# Patient Record
Sex: Male | Born: 1945 | Race: White | Hispanic: No | Marital: Married | Smoking: Former smoker
Health system: Southern US, Community
[De-identification: ages and names within clinical notes are randomized; demographics above are authoritative.]

## PROBLEM LIST (undated history)

## (undated) DIAGNOSIS — H269 Unspecified cataract: Secondary | ICD-10-CM

## (undated) DIAGNOSIS — N183 Chronic kidney disease, stage 3 unspecified: Secondary | ICD-10-CM

## (undated) DIAGNOSIS — Z87442 Personal history of urinary calculi: Secondary | ICD-10-CM

## (undated) DIAGNOSIS — I10 Essential (primary) hypertension: Secondary | ICD-10-CM

## (undated) DIAGNOSIS — I499 Cardiac arrhythmia, unspecified: Secondary | ICD-10-CM

## (undated) DIAGNOSIS — E119 Type 2 diabetes mellitus without complications: Secondary | ICD-10-CM

## (undated) DIAGNOSIS — J189 Pneumonia, unspecified organism: Secondary | ICD-10-CM

## (undated) DIAGNOSIS — N189 Chronic kidney disease, unspecified: Secondary | ICD-10-CM

## (undated) DIAGNOSIS — K219 Gastro-esophageal reflux disease without esophagitis: Secondary | ICD-10-CM

## (undated) DIAGNOSIS — T8859XA Other complications of anesthesia, initial encounter: Secondary | ICD-10-CM

## (undated) DIAGNOSIS — T4145XA Adverse effect of unspecified anesthetic, initial encounter: Secondary | ICD-10-CM

## (undated) DIAGNOSIS — N390 Urinary tract infection, site not specified: Secondary | ICD-10-CM

## (undated) DIAGNOSIS — M1990 Unspecified osteoarthritis, unspecified site: Secondary | ICD-10-CM

## (undated) DIAGNOSIS — R600 Localized edema: Secondary | ICD-10-CM

## (undated) DIAGNOSIS — M25569 Pain in unspecified knee: Secondary | ICD-10-CM

## (undated) DIAGNOSIS — Z6841 Body Mass Index (BMI) 40.0 and over, adult: Secondary | ICD-10-CM

## (undated) DIAGNOSIS — E785 Hyperlipidemia, unspecified: Secondary | ICD-10-CM

## (undated) HISTORY — PX: HAND SURGERY: SHX662

## (undated) HISTORY — DX: Chronic kidney disease, unspecified: N18.9

## (undated) HISTORY — PX: POLYPECTOMY: SHX149

## (undated) HISTORY — PX: COLONOSCOPY: SHX174

## (undated) HISTORY — DX: Localized edema: R60.0

## (undated) HISTORY — DX: Unspecified cataract: H26.9

## (undated) HISTORY — DX: Body Mass Index (BMI) 40.0 and over, adult: Z684

## (undated) HISTORY — DX: Morbid (severe) obesity due to excess calories: E66.01

## (undated) HISTORY — DX: Hyperlipidemia, unspecified: E78.5

---

## 2000-04-08 ENCOUNTER — Ambulatory Visit (HOSPITAL_BASED_OUTPATIENT_CLINIC_OR_DEPARTMENT_OTHER): Admission: RE | Admit: 2000-04-08 | Discharge: 2000-04-08 | Payer: Self-pay | Admitting: Orthopedic Surgery

## 2001-02-03 ENCOUNTER — Ambulatory Visit (HOSPITAL_BASED_OUTPATIENT_CLINIC_OR_DEPARTMENT_OTHER): Admission: RE | Admit: 2001-02-03 | Discharge: 2001-02-03 | Payer: Self-pay | Admitting: Orthopedic Surgery

## 2001-02-03 ENCOUNTER — Encounter (INDEPENDENT_AMBULATORY_CARE_PROVIDER_SITE_OTHER): Payer: Self-pay | Admitting: *Deleted

## 2003-02-01 ENCOUNTER — Encounter: Admission: RE | Admit: 2003-02-01 | Discharge: 2003-05-02 | Payer: Self-pay | Admitting: Family Medicine

## 2003-02-10 ENCOUNTER — Ambulatory Visit (HOSPITAL_BASED_OUTPATIENT_CLINIC_OR_DEPARTMENT_OTHER): Admission: RE | Admit: 2003-02-10 | Discharge: 2003-02-10 | Payer: Self-pay | Admitting: Family Medicine

## 2003-05-31 ENCOUNTER — Encounter: Admission: RE | Admit: 2003-05-31 | Discharge: 2003-08-29 | Payer: Self-pay | Admitting: Family Medicine

## 2003-06-24 ENCOUNTER — Emergency Department (HOSPITAL_COMMUNITY): Admission: EM | Admit: 2003-06-24 | Discharge: 2003-06-24 | Payer: Self-pay | Admitting: Emergency Medicine

## 2005-07-04 ENCOUNTER — Emergency Department (HOSPITAL_COMMUNITY): Admission: EM | Admit: 2005-07-04 | Discharge: 2005-07-05 | Payer: Self-pay | Admitting: Emergency Medicine

## 2005-10-07 ENCOUNTER — Ambulatory Visit: Payer: Self-pay | Admitting: Gastroenterology

## 2005-11-26 ENCOUNTER — Ambulatory Visit: Payer: Self-pay | Admitting: Gastroenterology

## 2005-12-04 ENCOUNTER — Ambulatory Visit (HOSPITAL_COMMUNITY): Admission: RE | Admit: 2005-12-04 | Discharge: 2005-12-04 | Payer: Self-pay | Admitting: Gastroenterology

## 2005-12-04 ENCOUNTER — Encounter (INDEPENDENT_AMBULATORY_CARE_PROVIDER_SITE_OTHER): Payer: Self-pay | Admitting: *Deleted

## 2005-12-06 ENCOUNTER — Ambulatory Visit: Payer: Self-pay | Admitting: Gastroenterology

## 2008-10-25 ENCOUNTER — Telehealth (INDEPENDENT_AMBULATORY_CARE_PROVIDER_SITE_OTHER): Payer: Self-pay | Admitting: *Deleted

## 2008-11-15 ENCOUNTER — Ambulatory Visit: Payer: Self-pay | Admitting: Gastroenterology

## 2008-12-08 ENCOUNTER — Encounter: Payer: Self-pay | Admitting: Gastroenterology

## 2008-12-08 ENCOUNTER — Ambulatory Visit (HOSPITAL_COMMUNITY): Admission: RE | Admit: 2008-12-08 | Discharge: 2008-12-08 | Payer: Self-pay | Admitting: Gastroenterology

## 2008-12-08 ENCOUNTER — Ambulatory Visit: Payer: Self-pay | Admitting: Gastroenterology

## 2008-12-09 ENCOUNTER — Encounter: Payer: Self-pay | Admitting: Gastroenterology

## 2010-11-21 LAB — CBC
HCT: 41.7 % (ref 39.0–52.0)
Hemoglobin: 13.9 g/dL (ref 13.0–17.0)
MCHC: 33.3 g/dL (ref 30.0–36.0)
MCV: 90 fL (ref 78.0–100.0)
Platelets: 213 10*3/uL (ref 150–400)
RBC: 4.63 MIL/uL (ref 4.22–5.81)
RDW: 13.4 % (ref 11.5–15.5)
WBC: 6.4 10*3/uL (ref 4.0–10.5)

## 2010-11-21 LAB — BASIC METABOLIC PANEL
BUN: 8 mg/dL (ref 6–23)
CO2: 31 mEq/L (ref 19–32)
Calcium: 9.5 mg/dL (ref 8.4–10.5)
Chloride: 100 mEq/L (ref 96–112)
Creatinine, Ser: 0.76 mg/dL (ref 0.4–1.5)
GFR calc Af Amer: >60 mL/min (ref >60)
GFR calc non Af Amer: >60 mL/min (ref >60)
Glucose, Bld: 123 mg/dL — ABNORMAL HIGH (ref 70–99)
Potassium: 4.5 mEq/L (ref 3.5–5.1)
Sodium: 139 mEq/L (ref 135–145)

## 2010-11-21 LAB — GLUCOSE, CAPILLARY: Glucose-Capillary: 112 mg/dL — ABNORMAL HIGH (ref 70–99)

## 2010-12-11 HISTORY — PX: OTHER SURGICAL HISTORY: SHX169

## 2010-12-11 HISTORY — PX: CARDIAC CATHETERIZATION: SHX172

## 2010-12-24 ENCOUNTER — Other Ambulatory Visit: Payer: Self-pay | Admitting: Cardiology

## 2010-12-24 ENCOUNTER — Ambulatory Visit
Admission: RE | Admit: 2010-12-24 | Discharge: 2010-12-24 | Disposition: A | Discharge status: Home / Self Care | Payer: Medicare Other | Source: Ambulatory Visit | Attending: Cardiology | Admitting: Cardiology

## 2010-12-28 ENCOUNTER — Ambulatory Visit (HOSPITAL_COMMUNITY)
Admission: RE | Admit: 2010-12-28 | Discharge: 2010-12-28 | Disposition: A | Discharge status: Home / Self Care | Payer: Medicare Other | Source: Ambulatory Visit | Attending: Cardiology | Admitting: Cardiology

## 2010-12-28 DIAGNOSIS — I10 Essential (primary) hypertension: Secondary | ICD-10-CM | POA: Insufficient documentation

## 2010-12-28 DIAGNOSIS — M1990 Unspecified osteoarthritis, unspecified site: Secondary | ICD-10-CM | POA: Insufficient documentation

## 2010-12-28 DIAGNOSIS — E119 Type 2 diabetes mellitus without complications: Secondary | ICD-10-CM | POA: Insufficient documentation

## 2010-12-28 DIAGNOSIS — E785 Hyperlipidemia, unspecified: Secondary | ICD-10-CM | POA: Insufficient documentation

## 2010-12-28 DIAGNOSIS — R9439 Abnormal result of other cardiovascular function study: Secondary | ICD-10-CM | POA: Insufficient documentation

## 2010-12-28 DIAGNOSIS — K219 Gastro-esophageal reflux disease without esophagitis: Secondary | ICD-10-CM | POA: Insufficient documentation

## 2010-12-28 DIAGNOSIS — I2510 Atherosclerotic heart disease of native coronary artery without angina pectoris: Secondary | ICD-10-CM | POA: Insufficient documentation

## 2010-12-28 DIAGNOSIS — R0789 Other chest pain: Secondary | ICD-10-CM | POA: Insufficient documentation

## 2010-12-28 LAB — GLUCOSE, CAPILLARY
Glucose-Capillary: 99 mg/dL (ref 70–99)
Glucose-Capillary: 103 mg/dL — ABNORMAL HIGH (ref 70–99)

## 2010-12-28 NOTE — Op Note (Signed)
[place]  Patient:    Alex Bowman, Alex Bowman                        MRN: [id number] Proc. Date: 02/03/01 Adm. Date:  [id number] Attending:  Ronne Binning CC:         Nicki Reaper, M.D. (2)   Operative Report  PREOPERATIVE DIAGNOSIS:  Dupuytrens contracture, left ring and little fingers.  POSTOPERATIVE DIAGNOSIS:  Dupuytrens contracture, left ring and little fingers.  OPERATION:  Excision of palmar fascia with V-Y advancement flaps, ring and little fingers, left hand.  SURGEON:  Nicki Reaper, M.D.  ASSISTANT:  Joaquin Courts, R.N.  ANESTHESIA:  Axillary block.  ANESTHESIOLOGIST:  Janetta Hora. Gelene Mink, M.D.  INDICATIONS:  The patient is a 65 year old male with a history of Dupuytrens contracture bilaterally.  He has undergone release of his right side.  He is admitted now for a release of his left ring and little fingers.  DESCRIPTION OF PROCEDURE:  The patient was brought to the operating room where an axillary block was carried out without difficulty.  He is prepped and draped using Betadine scrub and solution with the left arm free.  A volar zigzag Brunner incision is made primarily on the little finger and carried down through the subcutaneous tissue.  The palmar fascia was dissected off of the flexor retinaculum.  The neurovascular structures were identified proximally and followed distally.  With blunt and sharp dissection the entire cord was removed from the little finger onto the level of the middle phalanx. The abductor digiti quinti cord was also excised.  The neurovascular structures were protected throughout the procedure.  The finger became immediately straight.  A separate zigzag incision was made on the volar aspect of the ring finger, and carried down through the subcutaneous tissues.  Again the neurovascular structures are identified.  The cord to the ring finger proceeded to the proximal phalanx.  This was excised in toto.   This finger immediately became straight.  The neurovascular structures were then protected.  The wounds were irrigated and closed over a drain, converting the V-flaps to Ys, and then advancing the tips of the V into the limb of the Y. This allowed a complete closure with the fingers fully straight.  A sterile compressive dressing and splint were applied.  On deflation of the tourniquet the fingers pinked.  He was taken to the recovery room for observation in satisfactory condition.  DISPOSITION:  He is discharged home, to return to the [facility] in one week, on Vicodin and Keflex. DD:  02/03/01 TD:  02/03/01 Job: [id number]

## 2010-12-28 NOTE — Op Note (Signed)
[place]  Patient:    Alex Bowman, Alex Bowman                        MRN: [id number] Proc. Date: 04/08/00 Adm. Date:  [id number] Attending:  Ronne Binning                           Operative Report  PREOPERATIVE DIAGNOSIS:  Dupuytrens contracture, right little finger.  POSTOPERATIVE DIAGNOSIS:  Dupuytrens contracture, right little finger.  OPERATION:  Excision of Dupuytrens contracture, palmar fascia of right little finger.  SURGEON:  Nicki Reaper, M.D.  ASSISTANT:  RN.  ANESTHESIA:  Axillary block.  ANESTHESIOLOGIST:  Maren Beach, M.D.  INDICATIONS:  The patient is a 65 year old male with a history of Dupuytrens contracture with flexion of the metacarpophalangeal joint and PIP joint.  His metacarpophalangeal joint contracture is approximately 80 degrees.  DESCRIPTION OF PROCEDURE:  The patient was brought to the operating room where a forearm based tourniquet was placed and an axillary block was given.  He was prepped and draped using Betadine scrubbing solution with the right arm free. The limb was exsanguinated with an Esmarch bandage and the tourniquet placed on the forearm was inflated to 250 mmHg due to his size.  This was done after exsanguination with an Esmarch bandage.  The incisions were made _____ volar Brunner incision and carried down through subcutaneous tissue.  This was done over the carpal retinaculum.  This relieved the contracture with the finger coming almost straight.  The dissection was carried out to the level of the PIP joint.  The cord was identified.  This was released proximally.  The entire cord was excised with protection to the neurovascular structures throughout the procedure.  The entire cord was excised along with the cord present from the abductor digiti quinti.  The finger was able to be fully straightened out without difficulty.  The wounds were irrigated.  A vessel loop drain was placed in the  depths of the wound.  These were converted _______, allowing advancement of the ________, allowing complete closure without any undue skin tension.  This was performed with interrupted 5-0 nylon sutures.  A sterile compressive dressing applied and the tourniquet deflated.  The finger immediately pink.  He was taken to the recovery room after placement of a splint in satisfactory condition.  He is discharged home to return to the [facility] in one week on Vicodin and Keflex. DD:  04/08/00 TD:  04/09/00 Job: [id number]

## 2011-01-05 NOTE — Cardiovascular Report (Signed)
Alex Bowman, PODESTA NO.:  [id number]  MEDICAL RECORD NO.:  [id number]           PATIENT TYPE:  O  LOCATION:  6522                         FACILITY:  [facility]  PHYSICIAN:  Landry Corporal, MD DATE OF BIRTH:  1946-07-24  DATE OF PROCEDURE:  12/28/2010 DATE OF DISCHARGE:  12/28/2010                           CARDIAC CATHETERIZATION   PRIMARY CARDIOLOGIST:  Landry Corporal, MD  PERFORMING PHYSICIAN:  Landry Corporal, MD  PROCEDURE PERFORMED: 1. Left heart catheterization via 5-French right radial access. 2. Left ventriculogram in the RAO projection with 11 mL contrast per     second over 3 seconds for a total of 33 mL. 3. Native coronary angiography.  INDICATIONS:  Multiple cardiac risk factors with high-risk nuclear perfusion scan for atypical chest pain.  BRIEF HISTORY:  I saw Alex Bowman at the [facility] after he had a scheduled Persantine Myoview done for what sounds more like GERD pain.  However, he is morbidly obese.  He has diabetes, hypertension, hyperlipidemia, and his nuclear stress test was found to have TID of 1.25 with inferolateral ischemia.  He is now referred for diagnostic cardiac catheterization.  The risks, benefits, alternatives, and indications of procedure were explained to the patient in great detail during his clinic visit.  The patient was stable on the morning of the procedure.  He agreed to proceed with the procedure with all questions answered.  Informed consent was obtained with a signed form placed on the chart.  PROCEDURE:  The patient was brought to the second floor of [facility] cardiac catheterization lab in a fasting state.  The right wrist was evaluated with a Barbeau test.  This demonstrated excellent collateral flow in the left ulnar artery, so therefore he was prepped and draped in the usual sterile fashion with a right radial access.  A time-out period was performed and the  patient was sedated with intravenous Versed and fentanyl, a total of 2 mg of Versed and 50 mcg of fentanyl.  The right wrist was anesthetized using 1% subcutaneous lidocaine, and the right radial artery was accessed using the Seldinger technique with placement of a 5-French sheath.  The sheath was aspirated and flushed, and infiltrated with 10 mL radial cocktail described below.  Also 7000 units of intravenous heparin was administered intravenously.  Then, a 5-French TIG 4-0 catheter was advanced over safety J-wire into the ascending aorta and then used to engage first the right and left coronary arteries.  Multiple angiographic views of both the coronary artery systems were obtained.  The catheter was then exchanged over wire, which was used to advance the left ventricle for a 5-French angled pigtail catheter, which was then advanced across over the wire to left ventricle.  Left ventricular hemodynamics were measured.  LV gram in the RAO projection was performed then after doing so, hemodynamics were then remeasured and the catheter pulled back across the aortic valve for measuring pullback gradient.  After this was completed, the catheter removed completely out of body over wire.  The sheath was then removed in the cath lab with placement  of a TR band at 40 mL of air at 10:10 a.m..  The patient was stable before and after the procedure.  Estimated blood loss was less 10 mL, and there are no complications.  Total contrast was 75 mL.  Radial contrast includes 5 mg of verapamil, 400 mcg of nitroglycerin, and 2 mL of 1% lidocaine diluted in 10 mL of normal saline.  HEMODYNAMICS: 1. Central aortic pressure 123/60 mmHg with a mean of 87 mmHg. 2. The left ventricular pressure 126/9 mmHg with an EDP of 22 mmHg     mildly elevated. 3. Left ventriculogram showed EF of 55-60% with no wall motion     abnormalities.  ANGIOGRAPHIC FINDINGS: 1. Left main is very short.  Left main bifurcates  into circumflex     branch and a LAD.  There is no evidence of disease in the left     main. 2. The LAD has a large vessel that reached down to the apex.  It gives     rise to two moderate size diagonal branches and then several septal     perforators with no significant disease. 3. Circumflex is a large sweeping vessel, has a small first obtuse     marginal and then right before going to the atrioventricular     groove, gives off a bifurcating OM.  There is a 30% lesion just     prior to this branch point and then after the AV groove circ     continues, and the proximal portion of the OM has a 40% lesion and     then a 20% stenosis in the inferior branch.  None of these are     obstructive or calcified vessels. 4. The RCA is a dominant vessel gives rise to PDA as well as several     branches of the posterolateral system.  There is only a 30%     eccentric plaque noted in the early portion of the midvessel.     Otherwise, nonobstructive disease.  IMPRESSION: 1. 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 2. Preserved left ventricular function, however, elevated LVEDP of 22  mmHg may likely be due to contrast and IV hydration.  Would warrant     further evaluation as an outpatient.  PLAN:  Likely the standard post radial care, we will discharge at noon. After evaluating EDP with an echocardiogram and likely consider adding diuretic.  The patient will follow up with me at [facility] as previously scheduled.          ______________________________ Landry Corporal, MD     DWH/MEDQ  D:  12/31/2010  T:  01/01/2011  Job:  [id number]  cc:   Tracey Harries, M.D.  Electronically Signed by Bryan Lemma MD on 01/05/2011 12:03:25 PM

## 2012-01-29 ENCOUNTER — Other Ambulatory Visit: Payer: Self-pay | Admitting: Physician Assistant

## 2013-02-15 ENCOUNTER — Emergency Department (HOSPITAL_COMMUNITY): Payer: Medicare Other

## 2013-02-15 ENCOUNTER — Encounter (HOSPITAL_COMMUNITY): Payer: Self-pay | Admitting: Emergency Medicine

## 2013-02-15 ENCOUNTER — Emergency Department (HOSPITAL_COMMUNITY)
Admission: EM | Admit: 2013-02-15 | Discharge: 2013-02-16 | Disposition: A | Discharge status: Home / Self Care | Payer: Medicare Other | Attending: Emergency Medicine | Admitting: Emergency Medicine

## 2013-02-15 DIAGNOSIS — I10 Essential (primary) hypertension: Secondary | ICD-10-CM | POA: Insufficient documentation

## 2013-02-15 DIAGNOSIS — Z7982 Long term (current) use of aspirin: Secondary | ICD-10-CM | POA: Insufficient documentation

## 2013-02-15 DIAGNOSIS — N200 Calculus of kidney: Secondary | ICD-10-CM

## 2013-02-15 DIAGNOSIS — E119 Type 2 diabetes mellitus without complications: Secondary | ICD-10-CM | POA: Insufficient documentation

## 2013-02-15 DIAGNOSIS — R1032 Left lower quadrant pain: Secondary | ICD-10-CM | POA: Insufficient documentation

## 2013-02-15 DIAGNOSIS — Z8739 Personal history of other diseases of the musculoskeletal system and connective tissue: Secondary | ICD-10-CM | POA: Insufficient documentation

## 2013-02-15 DIAGNOSIS — Z79899 Other long term (current) drug therapy: Secondary | ICD-10-CM | POA: Insufficient documentation

## 2013-02-15 DIAGNOSIS — M549 Dorsalgia, unspecified: Secondary | ICD-10-CM

## 2013-02-15 HISTORY — DX: Type 2 diabetes mellitus without complications: E11.9

## 2013-02-15 HISTORY — DX: Pain in unspecified knee: M25.569

## 2013-02-15 HISTORY — DX: Essential (primary) hypertension: I10

## 2013-02-15 LAB — POCT I-STAT, CHEM 8
BUN: 27 mg/dL — ABNORMAL HIGH (ref 6–23)
Hemoglobin: 13.3 g/dL (ref 13.0–17.0)
Potassium: 4.4 mEq/L (ref 3.5–5.1)
Sodium: 141 mEq/L (ref 135–145)
TCO2: 24 mmol/L (ref 0–100)

## 2013-02-15 LAB — CBC
HCT: 38 % — ABNORMAL LOW (ref 39.0–52.0)
Hemoglobin: 12.9 g/dL — ABNORMAL LOW (ref 13.0–17.0)
MCH: 32.1 pg (ref 26.0–34.0)
MCHC: 33.9 g/dL (ref 30.0–36.0)
MCV: 94.5 fL (ref 78.0–100.0)
Platelets: 140 10*3/uL — ABNORMAL LOW (ref 150–400)
RBC: 4.02 MIL/uL — ABNORMAL LOW (ref 4.22–5.81)
RDW: 12.6 % (ref 11.5–15.5)
WBC: 6.6 10*3/uL (ref 4.0–10.5)

## 2013-02-15 MED ORDER — HYDROMORPHONE HCL PF 1 MG/ML IJ SOLN
1.0000 mg | Freq: Once | INTRAMUSCULAR | Status: AC
  Administered 2013-02-16: 1 mg via INTRAMUSCULAR
  Filled 2013-02-15 (×2): qty 1

## 2013-02-15 NOTE — ED Notes (Signed)
Dr. Jeraldine Loots, EDP at [facility]

## 2013-02-15 NOTE — ED Notes (Signed)
Pt c/o left lower side back pain that started about 6hrs ago while getting out of car. 10/10 when moving. While sitting still 5/10.

## 2013-02-15 NOTE — ED Provider Notes (Signed)
History    This chart was scribed for non-physician practitioner Dierdre Forth, PA working with Gerhard Munch, MD by Quintella Reichert, ED Scribe. This patient was seen in room TR11C/TR11C and the patient's care was started at 11:19 PM.  CSN: [id number]  Arrival date & time 02/15/13  2135    Chief Complaint  Patient presents with  . Back Pain    The history is provided by the patient. No language interpreter was used.     HPI Comments: Alex Bowman is a 67 y.o. male with h/o DM and HTN who presents to the Emergency Department complaining of constant, gradual-onset, progressively-worsening, moderate-to-severe back pain that began 10 hours ago when he was getting out of his car.  Pain is localized to the left lower back extending from the left hip 6 inches upward.  It is described as a stabbing pain that is greatly exacerbated by movement.  He rates pain at a severity of 5/10 when he is still but 10/10 when he moves.  He denies recent injuries or other activities that may have brought on pain.  He denies previous h/o back pain.  He denies abdominal pain, fever, chills, dysuria, bladder or bowel incontinence, weakness or numbness in legs, or any other associated symptoms.  His last BM was this morning and was normal.  He admits to h/o kidney stones.  Pt does not smoke or drink alcohol.  Wife also reports that pt bought a new car this weekend and was driving in it today before his pain began.  He did more driving today than other days previous in the new car, but both deny long car trips.      Past Medical History  Diagnosis Date  . Diabetes mellitus without complication   . Hypertension   . Knee pain     History reviewed. No pertinent past surgical history.   No family history on file.   History  Substance Use Topics  . Smoking status: Never Smoker   . Smokeless tobacco: Not on file  . Alcohol Use: No     Review of Systems  Constitutional: Negative for fever,  diaphoresis, appetite change, fatigue and unexpected weight change.  HENT: Negative for mouth sores and neck stiffness.   Eyes: Negative for visual disturbance.  Respiratory: Negative for cough, chest tightness, shortness of breath and wheezing.   Cardiovascular: Negative for chest pain.  Gastrointestinal: Negative for nausea, vomiting, abdominal pain, diarrhea and constipation.  Endocrine: Negative for polydipsia, polyphagia and polyuria.  Genitourinary: Negative for dysuria, urgency, frequency and hematuria.  Musculoskeletal: Positive for back pain.  Skin: Negative for rash.  Allergic/Immunologic: Negative for immunocompromised state.  Neurological: Negative for syncope, weakness, light-headedness, numbness and headaches.  Hematological: Does not bruise/bleed easily.  Psychiatric/Behavioral: Negative for sleep disturbance. The patient is not nervous/anxious.      Allergies  Review of patient's allergies indicates no known allergies.  Home Medications   Current Outpatient Rx  Name  Route  Sig  Dispense  Refill  . aspirin EC 81 MG tablet   Oral   Take 81 mg by mouth daily.         [name] atorvastatin (LIPITOR) 40 MG tablet   Oral   Take 40 mg by mouth daily.         [name] HYDROcodone-acetaminophen (NORCO) 10-325 MG per tablet   Oral   Take 1 tablet by mouth at bedtime.         [name] lisinopril-hydrochlorothiazide (PRINZIDE,ZESTORETIC) 20-12.5 MG per tablet  Oral   Take 1 tablet by mouth 2 (two) times daily.         . metFORMIN (GLUCOPHAGE) 500 MG tablet   Oral   Take 500 mg by mouth 2 (two) times daily with a meal.         . nebivolol (BYSTOLIC) 5 MG tablet   Oral   Take 5 mg by mouth daily.         [name] oxyCODONE-acetaminophen (PERCOCET/ROXICET) 5-325 MG per tablet   Oral   Take 1-2 tablets by mouth every 4 (four) hours as needed for pain.   21 tablet   0     BP 166/79  Pulse 60  Temp(Src) 97.6 F (36.4 C) (Oral)  Resp 18  SpO2 97%  Physical Exam  Nursing  note and vitals reviewed. Constitutional: He appears well-developed and well-nourished. No distress.  HENT:  Head: Normocephalic and atraumatic.  Mouth/Throat: Oropharynx is clear and moist. No oropharyngeal exudate.  Eyes: Conjunctivae and EOM are normal. Pupils are equal, round, and reactive to light.  Neck: Normal range of motion. Neck supple.  Full ROM without pain  Cardiovascular: Normal rate, regular rhythm, normal heart sounds and intact distal pulses.   No murmur heard. Pulses:      Radial pulses are 2+ on the right side, and 2+ on the left side.       Dorsalis pedis pulses are 2+ on the right side, and 2+ on the left side.  Pulmonary/Chest: Effort normal and breath sounds normal. No respiratory distress. He has no wheezes.  Abdominal: Soft. He exhibits no distension. There is tenderness (LLQ) in the left lower quadrant. There is no rebound and no guarding.  Obese Palpation of the LLQ produces pain in the Left lower back  Musculoskeletal: He exhibits no tenderness.       Lumbar back: He exhibits decreased range of motion, tenderness and pain. He exhibits no bony tenderness, no swelling, no edema, no deformity, no laceration and no spasm.       Back:  Decreased range of motion of the L-spine 2/2 pain No tenderness to palpation of the spinous processes of the T-spine or L-spine No tenderness to palpation of the paraspinous muscles of the L-spine though patient localizes pain to left l-spine paraspinal muscles Chronic nonpitting edema of the lower legs  Lymphadenopathy:    He has no cervical adenopathy.  Neurological: He is alert. He has normal strength and normal reflexes. No sensory deficit. GCS eye subscore is 4. GCS verbal subscore is 5. GCS motor subscore is 6.  Speech is clear and goal oriented, follows commands Normal strength in upper and lower extremities bilaterally including dorsiflexion and plantar flexion, strong and equal grip strength Sensation normal to light and  sharp touch Moves extremities without ataxia, coordination intact  Skin: Skin is warm and dry. No rash noted. He is not diaphoretic. No erythema.    ED Course  Procedures (including critical care time)  DIAGNOSTIC STUDIES: Oxygen Saturation is 97% on room air, normal by my interpretation.    COORDINATION OF CARE: 11:25 PM- Informed pt that imaging ruled out fracture.  Discussed treatment plan which includes labs, blood-work and evaluation by attending physician with pt at bedside and pt agreed to plan.     Labs Reviewed  CBC - Abnormal; Notable for the following:    RBC 4.02 (*)    Hemoglobin 12.9 (*)    HCT 38.0 (*)    Platelets 140 (*)    All  other components within normal limits  POCT I-STAT, CHEM 8 - Abnormal; Notable for the following:    BUN 27 (*)    Creatinine, Ser 1.60 (*)    All other components within normal limits  URINALYSIS, ROUTINE W REFLEX MICROSCOPIC    Ct Abdomen Pelvis Wo Contrast  02/16/2013   *RADIOLOGY REPORT*  Clinical Data: Back pain.  Flank pain since 3 o'clock this afternoon.  CT ABDOMEN AND PELVIS WITHOUT CONTRAST  Technique:  Multidetector CT imaging of the abdomen and pelvis was performed following the standard protocol without intravenous contrast.  Comparison: 06/24/2003  Findings: The lung bases are clear.  Coronary artery calcifications.  Small esophageal hiatal hernia.  Staghorn calculus in the lower pole of the right kidney is new since the previous study.  This measures up to about 3.7 x 1.9 cm. Collection of 3 small nonobstructing calculi in the lower pole of the left kidney, each measuring about 3 mm diameter.  No pyelocaliectasis or ureterectasis.  No ureteral stones.  No bladder stones.  There is minimal infiltration in the pararenal fat around the right kidney which is nonspecific and could represent normal variation.  Infection is not excluded.  The unenhanced appearance of the liver, spleen, gallbladder, pancreas, adrenal glands, inferior vena  cava, and retroperitoneal lymph nodes is unremarkable.  Calcification of the abdominal aorta without aneurysm.  The stomach and small bowel are decompressed. Diffusely stool filled colon without distension.  Small umbilical hernia containing fat.  Pelvis:  Calcification in the prostate gland.  Bladder wall is not thickened.  Appendix is normal.  No evidence of diverticulitis.  No free or loculated pelvic fluid collections.  No significant pelvic lymphadenopathy.  Degenerative changes in the lumbar spine and hips.  No destructive bone lesions are appreciated.  Slight sclerosis in the sacrum is probably due to degenerative change but stress fracture not excluded.  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  No ureteral stone or obstruction demonstrated.   Original Report Authenticated By: Burman Nieves, M.D.   Dg Lumbar Spine Complete  02/15/2013   *RADIOLOGY REPORT*  Clinical Data: Back pain.  No trauma.  LUMBAR SPINE - COMPLETE 4+ VIEW  Comparison: Abdomen 06/02/2007  Findings: Five lumbar type vertebrae.  Mild anterior subluxation of L4 on L5.  This is likely degenerative.  Otherwise normal alignment of the lumbar vertebrae and facet joints.  Diffuse degenerative changes with narrowed lumbar interspaces and endplate hypertrophic changes throughout the lumbar spine.  Degenerative changes throughout the facet joints.  No vertebral compression deformities. No focal bone lesion or bone destruction.  Bone cortex and trabecular architecture appear intact.  Vascular calcifications. Incidental note of a large stone ( 2.3 cm diameter) projected over the right kidney.  This is enlarging since the previous abdominal film.  IMPRESSION: Diffuse degenerative change throughout the lumbar spine.  No displaced fractures identified.  Large stone in the lower pole right kidney.   Original Report Authenticated By: Burman Nieves, M.D.    1. Kidney stones   2.  Back pain   3. LLQ abdominal pain      MDM  Tula Nakayama presents with back pain and LLQ abd pain on exam.  No neurological deficits without assessment of gait.   No loss of bowel or bladder control.  No concern for cauda equina.  No fever, night sweats, weight loss, h/o cancer, IVDU.  CT abd with Bilateral nonobstructing intrarenal stones with staghorn calculus in the  right renal lower pole. No ureteral stone or obstruction demonstrated.  Degenerative changes in the lumbar spine and hips. No destructive bone lesions are appreciated.  I personally reviewed the imaging tests through PACS system.  I reviewed available ER/hospitalization records through the EMR.  Mastectomy with elevated BUN and creatinine when compared to last checked 2 years ago. CBC without leukocytosis.  Will plan for d/c home with pain control.  Wife and Patient are agreeable to plan.  Discussed with Ivonne Andrew, PA-C.  Patient pain decreasing after shot. Pending urinalysis to rule out urinary tract infection. Patient is to ambulate before discharge.  Theron Arista will followup on ambulation and urinalysis.  If no concerning findings D/C home with urology followup.  Discharge paperwork complete in anticipation of discharge.    Dr. Jeraldine Loots was consulted, evaluated this patient with me and agrees with the plan.    I personally performed the services described in this documentation, which was scribed in my presence. The recorded information has been reviewed and is accurate.   Dahlia Client Laycee Fitzsimmons, PA-C 02/16/13 0045

## 2013-02-15 NOTE — ED Notes (Addendum)
Pt to xray

## 2013-02-16 LAB — URINALYSIS, ROUTINE W REFLEX MICROSCOPIC
Bilirubin Urine: NEGATIVE
Glucose, UA: NEGATIVE mg/dL
Ketones, ur: NEGATIVE mg/dL
Nitrite: NEGATIVE
Protein, ur: NEGATIVE mg/dL
Specific Gravity, Urine: 1.015 (ref 1.005–1.030)
Urobilinogen, UA: 0.2 mg/dL (ref 0.0–1.0)
pH: 7 (ref 5.0–8.0)

## 2013-02-16 LAB — URINE MICROSCOPIC-ADD ON

## 2013-02-16 MED ORDER — OXYCODONE-ACETAMINOPHEN 5-325 MG PO TABS: 1.0000 | ORAL_TABLET | ORAL | Status: DC | PRN

## 2013-02-16 NOTE — ED Provider Notes (Signed)
Medical screening examination/treatment/procedure(s) were performed by non-physician practitioner and as supervising physician I was immediately available for consultation/collaboration.   Drake Wuertz H Kendle Turbin, MD 02/16/13 0606 

## 2013-02-16 NOTE — ED Provider Notes (Signed)
Alex Bowman S 12:00 AM patient discussed and signed now. Patient with flank and abdominal pains. CT demonstrating multiple intrarenal kidney stones. UA pending to rule out infection. Patient may be discharged with planned followup in this and make treatment if no infection.  UA with some hematuria but no other signs concerning for UTI. He has been up ambulating with nursing staff. This time he is able to be discharged with treatment plan.  [name], PA-C 02/16/13 [phone]

## 2013-02-16 NOTE — ED Notes (Signed)
Pt denies any questions and reports decrease in pain upon discharge.

## 2013-02-16 NOTE — ED Notes (Addendum)
Pt ambulatory to b/r careful slow gait with cane. Urine sent.

## 2013-02-16 NOTE — ED Notes (Addendum)
Pt alert, NAD,calm, interactive, resps e/u, speaking in clear complete sentences, skin W&D, c/o low back pain, rates 6/10, worse with movement, denies other sx at this time, given blanket for c/o "feel cold" (room temp is cold, no fever), pain med given, back from CT, pending results, wife at [facility]. Pt aware of need for urine sample, will attempt when pain med starts working.

## 2013-02-16 NOTE — ED Provider Notes (Signed)
  This was a shared visit with a mid-level provided (NP or PA).  Throughout the patient's course I was available for consultation/collaboration.  I saw the ECG (if appropriate), relevant labs and studies - I agree with the interpretation.  On my exam the patient was uncomfortable appearing.  With his back pain, abdominal tenderness to palpation, there suspicion for stone versus infection.  CT scan was performed, and labs were ordered.  These results were pending on sign out.      Gerhard Munch, MD 02/16/13 [phone]

## 2013-03-02 ENCOUNTER — Ambulatory Visit (INDEPENDENT_AMBULATORY_CARE_PROVIDER_SITE_OTHER): Payer: Medicare Other | Admitting: Cardiology

## 2013-03-02 ENCOUNTER — Encounter: Payer: Self-pay | Admitting: Cardiology

## 2013-03-02 VITALS — BP 130/66 | HR 57 | Ht 69.0 in | Wt 312.9 lb

## 2013-03-02 DIAGNOSIS — E119 Type 2 diabetes mellitus without complications: Secondary | ICD-10-CM

## 2013-03-02 DIAGNOSIS — G4733 Obstructive sleep apnea (adult) (pediatric): Secondary | ICD-10-CM

## 2013-03-02 DIAGNOSIS — R600 Localized edema: Secondary | ICD-10-CM

## 2013-03-02 DIAGNOSIS — I10 Essential (primary) hypertension: Secondary | ICD-10-CM

## 2013-03-02 DIAGNOSIS — E785 Hyperlipidemia, unspecified: Secondary | ICD-10-CM

## 2013-03-02 DIAGNOSIS — E8881 Metabolic syndrome: Secondary | ICD-10-CM | POA: Insufficient documentation

## 2013-03-02 DIAGNOSIS — R609 Edema, unspecified: Secondary | ICD-10-CM

## 2013-03-02 NOTE — Patient Instructions (Addendum)
You keep doing well with your weight loss!! Keep it up & your Blood Pressure, Diabetes & cholesterol will all fall into line.   I am happy to see you doing well.    Good luck at the Urology clinic.  I will see you back in a year - hopefully even lighter.  Marykay Lex, MD

## 2013-03-13 ENCOUNTER — Encounter: Payer: Self-pay | Admitting: Cardiology

## 2013-03-13 DIAGNOSIS — E119 Type 2 diabetes mellitus without complications: Secondary | ICD-10-CM | POA: Insufficient documentation

## 2013-03-13 DIAGNOSIS — G4733 Obstructive sleep apnea (adult) (pediatric): Secondary | ICD-10-CM | POA: Insufficient documentation

## 2013-03-13 DIAGNOSIS — I10 Essential (primary) hypertension: Secondary | ICD-10-CM | POA: Insufficient documentation

## 2013-03-13 DIAGNOSIS — R600 Localized edema: Secondary | ICD-10-CM | POA: Insufficient documentation

## 2013-03-13 DIAGNOSIS — E785 Hyperlipidemia, unspecified: Secondary | ICD-10-CM | POA: Insufficient documentation

## 2013-03-13 NOTE — Assessment & Plan Note (Addendum)
On statin, and weight loss plan. Monitor by Primary Physician. With metabolic syndrome goal LDL is still less than 100 with HDL greater than 40.

## 2013-03-13 NOTE — Assessment & Plan Note (Signed)
Within the combination of diagnoses, metabolic syndrome is apparent. Despite his relatively normal cardiac evaluation in the past, he continues to be high-risk. Continued risk factor modification is recommended.  Plan aggressive diabetic control, continue blood pressure control with ACE inhibitor in a diabetic and beta blocker for cardiac protection; continued lipid control with statin and prophylaxis with aspirin.

## 2013-03-13 NOTE — Assessment & Plan Note (Signed)
I not sure who had started in the CPAP pathway. Hopefully this another option for him, and he is a setup for developing obesity hypoventilation syndrome. It would really be great to get him on some type of CPAP or even any home oxygen. I will defer that to either his primary or his sleep medicine physician.  If he does not have an active sleep medicine physician we came in and to see Dr. Tresa Endo here for evaluation.

## 2013-03-13 NOTE — Progress Notes (Signed)
Patient ID: Alex Bowman, male   DOB: June 06, 1946, 67 y.o.   MRN: [id number]  PCP: Aura Dials, MD  Clinic Note: Chief Complaint  Patient presents with  . Annual Exam    no chest pain ,no sob,no edema   HPI: Alex Bowman is a 67 y.o. morbidly obese male with a PMH below who presents today for yearly followup.  He was evaluated back in 2012 for chest discomfort symptoms. A stress test was read as positive/high-risk, however invasive evaluation with angiography failed to reveal any significant coronary disease. Is mostly followed up here for monitoring of his cardiac risk factors. With a combination of dyslipidemia (in the past related to stress and low HDL), hypertension and diabetes as well as obesity, he meets criteria for the metabolic syndrome I which places her at high-risk for adverse cardiac outcomes.  Interval History: During his last visit he does so well his weights down to 205 pounds. Some of that may have been due to him being quite sick for a while, now he's gained a lot of that weight back to 312 pounds. He has to walk with a cane or walker D2 is knees and hip pain. Is really limited in what he does report activity. With what is able to do he denies any chest pain or shortness of breath at rest or with exertion. He has tonic lower charity edema which he treats with when necessary Lasix. He says relatively well controlled for him. He denies any PND, but he sleeps in a recliner for unclear reasons his most is his hip and knee pain as opposed to orthopnea. Although with his significant girth, he may not do well with lying flat.  The remainder of cardiac review of systems is as follows negative for - irregular heartbeat, loss of consciousness, murmur, palpitations, rapid heart rate or shortness of breath except with significant exertion. No lightheadedness, dizziness, wooziness area to secure near-syncope. No TIA or amaurosis fugax was. No melena, hematochezia or hematuria. No  claudication symptoms.  He was recently evaluated in the hospital for nephrolithiasis, and has been referred to the urology clinic. Apparently they found several large stones.   Despite his arthritis pains, he is still trying to work on doing exercise through [employer] as well  as [employer].  Past Medical History  Diagnosis Date  . Diabetes mellitus without complication   . Hypertension   . Knee pain   . Morbid obesity with BMI of 45.0-49.9, adult     Attempted weight loss efforts at last visit had BMI down to 68, unfortunately back up to 46 with weight going up to 312 lb from 285 lb  . Dyslipidemia     Monitored by Primary Physician. On statin  . Bilateral lower extremity edema     Chronic, with venous insufficiency  . OSA (obstructive sleep apnea)      intolerant of CPAP    Prior Cardiac Evaluation and Past Surgical History: Past Surgical History  Procedure Laterality Date  . Nm myoview ltd; persantine  May 2012    Suggested as high risk with 3 times a day 1.2 to, mild to moderate 3 times a day affecting basolateral and mid lateral walls.  . 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 Mild elevation in LVDP, EF 55%. --> False-positive stress test   Allergies: No Known Allergies  Current Outpatient Prescriptions  Medication Sig Dispense Refill  . aspirin EC  81 MG tablet Take 81 mg by mouth daily.      [name] atorvastatin (LIPITOR) 40 MG tablet Take 40 mg by mouth daily.      [name] HYDROcodone-acetaminophen (NORCO) 10-325 MG per tablet Take 1 tablet by mouth at bedtime.      [name] lisinopril-hydrochlorothiazide (PRINZIDE,ZESTORETIC) 20-12.5 MG per tablet Take 1 tablet by mouth 2 (two) times daily.      . metFORMIN (GLUCOPHAGE) 500 MG tablet Take 500 mg by mouth 2 (two) times daily with a meal.      . multivitamin (ONE-A-DAY MEN'S) TABS Take 1 tablet by mouth daily.      . nebivolol (BYSTOLIC) 5 MG tablet Take 5 mg by mouth daily.      .  Hydrocodone-APAP-Dietary Prod (HYDROCODONE-APAP-NUTRIT SUPP) 10-325 MG MISC        No current facility-administered medications for this visit.    History   Social History  . Marital Status: Married    Spouse Name: N/A    Number of Children: N/A  . Years of Education: N/A   Occupational History  . Not on file.   Social History Main Topics  . Smoking status: Former Smoker    Quit date: 03/02/1997  . Smokeless tobacco: Not on file  . Alcohol Use: No  . Drug Use: No  . Sexually Active: Not on file   Other Topics Concern  . Not on file   Social History Narrative   He is a married father of 2, grandfather of one. Quit smoking over 15 years ago. Does not drink. Limited exercise due to knees and hip pain.   ROS: A comprehensive Review of Systems - Negative except Pertinent positives above. Noncardiac positives below General ROS: positive for  - weight gain and After significant weight loss negative for - chills, fatigue, fever, malaise, sleep disturbance or Although he does not use CPAP due to intolerance Respiratory ROS: no cough, shortness of breath, or wheezing Genito-Urinary ROS: positive for - change in urinary stream, dysuria, pelvic pain and Related to recent nephrolithiasis bilaterally. He was seen not that long ago in the emergency room and treated. Musculoskeletal ROS: positive for - Bilateral knee osteoporotic pain as well as hip arthritis pain  PHYSICAL EXAM BP 130/66  Pulse 57  Ht 5\' 9"  (1.753 m)  Wt 312 lb 14.4 oz (141.931 kg)  BMI 46.19 kg/m2 General appearance: alert, cooperative, appears stated age, no distress, morbidly obese and Pleasant mood and affect, well groomed. Answers questions appropriately. Neck: no carotid bruit, no JVD and supple, symmetrical, trachea midline Lungs: clear to auscultation bilaterally, normal percussion bilaterally and Nonlabored, but distant breath sounds due to body habitus. Heart: regular rate and rhythm, S1, S2 normal, no  murmur, click, rub or gallop and Unable to palpate PMI. Distant heart sounds Abdomen: soft, non-tender; bowel sounds normal; no masses,  no organomegaly and Morbidly obese Extremities: edema 1-2+ bilateral lower extremities, no ulcers, gangrene or trophic changes and venous stasis dermatitis noted Pulses: 2+ and symmetric Neurologic: Grossly normal  [id number] today: Yes Rate: 57 , Rhythm:  Sinus Bradycardia; otherwise normal ECG  Recent Labs: From recent ER visit 02/15/2013: BUN/creatinine 27/1.6, potassium 4.4. H&H 13.3/39  ASSESSMENT / PLAN: Overall stable from a cardiac standpoint, unfortunately has gained back 27 pounds that he had previously lost, but remains 12 pounds down. I did congratulate him on that weight loss, and encouraged him to continue on with his efforts.   Metabolic syndrome Within the combination of diagnoses, metabolic syndrome  is apparent. Despite his relatively normal cardiac evaluation in the past, he continues to be high-risk. Continued risk factor modification is recommended.  Plan aggressive diabetic control, continue blood pressure control with ACE inhibitor in a diabetic and beta blocker for cardiac protection; continued lipid control with statin and prophylaxis with aspirin.  Severe obesity (BMI >= 40) Almost back to the drawing board here. The goal would be for him to lose a full 30 pounds in a year. Unfortunately some of the original weight loss was due to him being ill, and he gained weight back and started feeling better. He is down 12 pounds from when I saw him a year ago, this is less than half of his target weight for a year.  He does seem to be trying to get exercise through [employer] and [employer]. Hopefully he can also get some dietary counseling to this as well.  Dyslipidemia On statin, and weight loss plan. Monitor by Primary Physician. With metabolic syndrome goal LDL is still less than 100 with HDL greater than  40.  Hypertension Well-controlled, at goal for diabetes. Appropriately on ACE inhibitor.  Bilateral lower extremity edema He continues to take when necessary furosemide. I talked about the potential of using compression stockings and ensuring that he elevates his legs/feet when not up and about.  OSA (obstructive sleep apnea) I not sure who had started in the CPAP pathway. Hopefully this another option for him, and he is a setup for developing obesity hypoventilation syndrome. It would really be great to get him on some type of CPAP or even any home oxygen. I will defer that to either his primary or his sleep medicine physician.  If he does not have an active sleep medicine physician we came in and to see Dr. Tresa Endo here for evaluation.   Orders Placed This Encounter  Procedures  . EKG 12-Lead    Followup:  1 year  Desaree Downen W. Herbie Baltimore, M.D., M.S. [facility] [address] [place], [place]  [address]  [phone] Pager # [phone]

## 2013-03-13 NOTE — Assessment & Plan Note (Signed)
Almost back to the drawing board here. The goal would be for him to lose a full 30 pounds in a year. Unfortunately some of the original weight loss was due to him being ill, and he gained weight back and started feeling better. He is down 12 pounds from when I saw him a year ago, this is less than half of his target weight for a year.  He does seem to be trying to get exercise through [employer] and [employer]. Hopefully he can also get some dietary counseling to this as well.

## 2013-03-13 NOTE — Assessment & Plan Note (Signed)
Well-controlled, at goal for diabetes. Appropriately on ACE inhibitor.

## 2013-03-13 NOTE — Assessment & Plan Note (Signed)
He continues to take when necessary furosemide. I talked about the potential of using compression stockings and ensuring that he elevates his legs/feet when not up and about.

## 2013-03-15 ENCOUNTER — Other Ambulatory Visit: Payer: Self-pay | Admitting: Urology

## 2013-04-06 ENCOUNTER — Encounter (HOSPITAL_COMMUNITY): Payer: Self-pay | Admitting: Pharmacy Technician

## 2013-04-09 ENCOUNTER — Other Ambulatory Visit (HOSPITAL_COMMUNITY): Payer: Self-pay | Admitting: *Deleted

## 2013-04-13 ENCOUNTER — Encounter (HOSPITAL_COMMUNITY): Payer: Self-pay

## 2013-04-13 ENCOUNTER — Encounter (HOSPITAL_COMMUNITY)
Admission: RE | Admit: 2013-04-13 | Discharge: 2013-04-13 | Disposition: A | Discharge status: Home / Self Care | Payer: Medicare Other | Source: Ambulatory Visit | Attending: Urology | Admitting: Urology

## 2013-04-13 ENCOUNTER — Ambulatory Visit (HOSPITAL_COMMUNITY)
Admission: RE | Admit: 2013-04-13 | Discharge: 2013-04-13 | Disposition: A | Discharge status: Home / Self Care | Payer: Medicare Other | Source: Ambulatory Visit | Attending: Urology | Admitting: Urology

## 2013-04-13 DIAGNOSIS — Z01812 Encounter for preprocedural laboratory examination: Secondary | ICD-10-CM | POA: Insufficient documentation

## 2013-04-13 DIAGNOSIS — I10 Essential (primary) hypertension: Secondary | ICD-10-CM | POA: Insufficient documentation

## 2013-04-13 DIAGNOSIS — Z01818 Encounter for other preprocedural examination: Secondary | ICD-10-CM | POA: Insufficient documentation

## 2013-04-13 DIAGNOSIS — N200 Calculus of kidney: Secondary | ICD-10-CM | POA: Insufficient documentation

## 2013-04-13 DIAGNOSIS — E119 Type 2 diabetes mellitus without complications: Secondary | ICD-10-CM | POA: Insufficient documentation

## 2013-04-13 HISTORY — DX: Gastro-esophageal reflux disease without esophagitis: K21.9

## 2013-04-13 HISTORY — DX: Pneumonia, unspecified organism: J18.9

## 2013-04-13 HISTORY — DX: Other complications of anesthesia, initial encounter: T88.59XA

## 2013-04-13 HISTORY — DX: Unspecified osteoarthritis, unspecified site: M19.90

## 2013-04-13 HISTORY — DX: Adverse effect of unspecified anesthetic, initial encounter: T41.45XA

## 2013-04-13 LAB — BASIC METABOLIC PANEL
BUN: 20 mg/dL (ref 6–23)
Creatinine, Ser: 1.56 mg/dL — ABNORMAL HIGH (ref 0.50–1.35)
GFR calc Af Amer: 51 mL/min — ABNORMAL LOW (ref >90)
GFR calc non Af Amer: 44 mL/min — ABNORMAL LOW (ref >90)
Potassium: 4.9 mEq/L (ref 3.5–5.1)

## 2013-04-13 LAB — CBC
MCHC: 32.4 g/dL (ref 30.0–36.0)
RDW: 12.6 % (ref 11.5–15.5)
WBC: 6.4 10*3/uL (ref 4.0–10.5)

## 2013-04-13 NOTE — Patient Instructions (Signed)
20 WASH NIENHAUS  04/13/2013   Your procedure is scheduled on: 04/19/13  Report to [facility] at 9:00 AM.  Call this number if you have problems the morning of surgery [phone]   Remember:   Do not eat food or drink liquids After Midnight.     Take these medicines the morning of surgery with A SIP OF WATER: lipitor, bystolic (nebivolol)   Do not wear jewelry, make-up or nail polish.  Do not wear lotions, powders, or perfumes. You may wear deodorant.  Do not shave 48 hours prior to surgery. Men may shave face and neck.  Do not bring valuables to the hospital.  Contacts, dentures or bridgework may not be worn into surgery.  Leave suitcase in the car. After surgery it may be brought to your room.  For patients admitted to the hospital, checkout time is 11:00 AM the day of discharge.    Please read over the following fact sheets that you were given: incentive spirometry fact sheet Birdie Sons, RN  pre op nurse call if needed [phone]    FAILURE TO FOLLOW THESE INSTRUCTIONS MAY RESULT IN CANCELLATION OF YOUR SURGERY   Patient Signature: ___________________________________________

## 2013-04-13 NOTE — Progress Notes (Signed)
04/13/13 1041  OBSTRUCTIVE SLEEP APNEA  Have you ever been diagnosed with sleep apnea through a sleep study? No  Do you snore loudly (loud enough to be heard through closed doors)?  0  Do you often feel tired, fatigued, or sleepy during the daytime? 0  Has anyone observed you stop breathing during your sleep? 0  Do you have, or are you being treated for high blood pressure? 1  BMI more than 35 kg/m2? 1  Age over 68 years old? 1  Neck circumference greater than 40 cm/18 inches? 1  Gender: 1  Obstructive Sleep Apnea Score 5  Score 4 or greater  Results sent to PCP

## 2013-04-13 NOTE — Progress Notes (Signed)
EKG 03/02/13 on EPIC

## 2013-04-19 ENCOUNTER — Encounter (HOSPITAL_COMMUNITY): Payer: Self-pay

## 2013-04-19 ENCOUNTER — Encounter (HOSPITAL_COMMUNITY)
Admission: RE | Disposition: A | Discharge status: Home / Self Care | Payer: Self-pay | Source: Ambulatory Visit | Attending: Urology

## 2013-04-19 ENCOUNTER — Inpatient Hospital Stay (HOSPITAL_COMMUNITY)
Admission: RE | Admit: 2013-04-19 | Discharge: 2013-04-26 | DRG: 660 | Disposition: A | Discharge status: Home / Self Care | Payer: Medicare Other | Source: Ambulatory Visit | Attending: Urology | Admitting: Urology

## 2013-04-19 ENCOUNTER — Encounter (HOSPITAL_COMMUNITY): Payer: Self-pay | Admitting: Anesthesiology

## 2013-04-19 ENCOUNTER — Inpatient Hospital Stay (HOSPITAL_COMMUNITY): Payer: Medicare Other

## 2013-04-19 ENCOUNTER — Inpatient Hospital Stay (HOSPITAL_COMMUNITY): Payer: Medicare Other | Admitting: Anesthesiology

## 2013-04-19 ENCOUNTER — Inpatient Hospital Stay (HOSPITAL_COMMUNITY): Admission: RE | Admit: 2013-04-19 | Payer: Medicare Other | Source: Ambulatory Visit | Admitting: Urology

## 2013-04-19 DIAGNOSIS — N201 Calculus of ureter: Secondary | ICD-10-CM | POA: Diagnosis present

## 2013-04-19 DIAGNOSIS — Z87891 Personal history of nicotine dependence: Secondary | ICD-10-CM

## 2013-04-19 DIAGNOSIS — N183 Chronic kidney disease, stage 3 unspecified: Secondary | ICD-10-CM | POA: Diagnosis present

## 2013-04-19 DIAGNOSIS — N3289 Other specified disorders of bladder: Secondary | ICD-10-CM | POA: Diagnosis present

## 2013-04-19 DIAGNOSIS — R609 Edema, unspecified: Secondary | ICD-10-CM | POA: Diagnosis present

## 2013-04-19 DIAGNOSIS — M1990 Unspecified osteoarthritis, unspecified site: Secondary | ICD-10-CM | POA: Diagnosis present

## 2013-04-19 DIAGNOSIS — E785 Hyperlipidemia, unspecified: Secondary | ICD-10-CM | POA: Diagnosis present

## 2013-04-19 DIAGNOSIS — I2510 Atherosclerotic heart disease of native coronary artery without angina pectoris: Secondary | ICD-10-CM | POA: Diagnosis present

## 2013-04-19 DIAGNOSIS — E119 Type 2 diabetes mellitus without complications: Secondary | ICD-10-CM | POA: Diagnosis present

## 2013-04-19 DIAGNOSIS — G4730 Sleep apnea, unspecified: Secondary | ICD-10-CM | POA: Diagnosis present

## 2013-04-19 DIAGNOSIS — I129 Hypertensive chronic kidney disease with stage 1 through stage 4 chronic kidney disease, or unspecified chronic kidney disease: Secondary | ICD-10-CM | POA: Diagnosis present

## 2013-04-19 DIAGNOSIS — I872 Venous insufficiency (chronic) (peripheral): Secondary | ICD-10-CM | POA: Diagnosis present

## 2013-04-19 DIAGNOSIS — N200 Calculus of kidney: Principal | ICD-10-CM | POA: Diagnosis present

## 2013-04-19 DIAGNOSIS — Z6841 Body Mass Index (BMI) 40.0 and over, adult: Secondary | ICD-10-CM

## 2013-04-19 DIAGNOSIS — N179 Acute kidney failure, unspecified: Secondary | ICD-10-CM | POA: Diagnosis not present

## 2013-04-19 HISTORY — PX: CYSTOSCOPY WITH STENT PLACEMENT: SHX5790

## 2013-04-19 HISTORY — PX: NEPHROLITHOTOMY: SHX5134

## 2013-04-19 LAB — CBC
Hemoglobin: 14.1 g/dL (ref 13.0–17.0)
MCH: 32.3 pg (ref 26.0–34.0)
MCV: 94.7 fL (ref 78.0–100.0)
RBC: 4.36 MIL/uL (ref 4.22–5.81)
WBC: 5.5 10*3/uL (ref 4.0–10.5)

## 2013-04-19 LAB — GLUCOSE, CAPILLARY
Glucose-Capillary: 103 mg/dL — ABNORMAL HIGH (ref 70–99)
Glucose-Capillary: 96 mg/dL (ref 70–99)
Glucose-Capillary: 70 mg/dL (ref 70–99)

## 2013-04-19 LAB — BASIC METABOLIC PANEL
CO2: 26 mEq/L (ref 19–32)
Calcium: 9.4 mg/dL (ref 8.4–10.5)
Chloride: 99 mEq/L (ref 96–112)
Glucose, Bld: 111 mg/dL — ABNORMAL HIGH (ref 70–99)
Sodium: 137 mEq/L (ref 135–145)

## 2013-04-19 SURGERY — NEPHROLITHOTOMY PERCUTANEOUS
Anesthesia: General | Laterality: Right | Wound class: Clean Contaminated

## 2013-04-19 MED ORDER — ONDANSETRON HCL 4 MG/2ML IJ SOLN
INTRAMUSCULAR | Status: DC | PRN
  Administered 2013-04-19 (×2): 2 mg via INTRAVENOUS

## 2013-04-19 MED ORDER — SODIUM CHLORIDE 0.9 % IR SOLN
Status: DC | PRN
  Administered 2013-04-19: 12000 mL

## 2013-04-19 MED ORDER — HYDROMORPHONE HCL PF 1 MG/ML IJ SOLN
0.5000 mg | INTRAMUSCULAR | Status: DC | PRN
  Administered 2013-04-22: 0.5 mg via INTRAVENOUS
  Administered 2013-04-22 – 2013-04-26 (×13): 1 mg via INTRAVENOUS
  Filled 2013-04-19 (×14): qty 1

## 2013-04-19 MED ORDER — PIPERACILLIN-TAZOBACTAM 3.375 G IVPB 30 MIN
3.3750 g | Freq: Once | INTRAVENOUS | Status: AC
  Administered 2013-04-19: 3.375 g via INTRAVENOUS
  Filled 2013-04-19: qty 50

## 2013-04-19 MED ORDER — INSULIN ASPART 100 UNIT/ML ~~LOC~~ SOLN
0.0000 [IU] | Freq: Three times a day (TID) | SUBCUTANEOUS | Status: DC
  Administered 2013-04-22 – 2013-04-25 (×2): 3 [IU] via SUBCUTANEOUS

## 2013-04-19 MED ORDER — FENTANYL CITRATE 0.05 MG/ML IJ SOLN
25.0000 ug | INTRAMUSCULAR | Status: DC | PRN
  Administered 2013-04-19 (×2): 50 ug via INTRAVENOUS

## 2013-04-19 MED ORDER — FUROSEMIDE 20 MG PO TABS: 20.0000 mg | ORAL_TABLET | Freq: Every day | ORAL | Status: DC | PRN

## 2013-04-19 MED ORDER — DOCUSATE SODIUM 100 MG PO CAPS
100.0000 mg | ORAL_CAPSULE | Freq: Two times a day (BID) | ORAL | Status: DC
  Administered 2013-04-19 – 2013-04-26 (×13): 100 mg via ORAL
  Filled 2013-04-19 (×15): qty 1

## 2013-04-19 MED ORDER — IOHEXOL 300 MG/ML  SOLN
INTRAMUSCULAR | Status: DC | PRN
  Administered 2013-04-19: 25 mL via URETHRAL

## 2013-04-19 MED ORDER — POTASSIUM CHLORIDE IN NACL 20-0.9 MEQ/L-% IV SOLN
INTRAVENOUS | Status: DC
  Administered 2013-04-19 – 2013-04-21 (×4): via INTRAVENOUS
  Filled 2013-04-19 (×5): qty 1000

## 2013-04-19 MED ORDER — SENNA 8.6 MG PO TABS
1.0000 | ORAL_TABLET | Freq: Two times a day (BID) | ORAL | Status: DC
  Administered 2013-04-19 – 2013-04-26 (×13): 8.6 mg via ORAL
  Filled 2013-04-19 (×13): qty 1

## 2013-04-19 MED ORDER — LISINOPRIL-HYDROCHLOROTHIAZIDE 20-12.5 MG PO TABS: 1.0000 | ORAL_TABLET | Freq: Two times a day (BID) | ORAL | Status: DC

## 2013-04-19 MED ORDER — LACTATED RINGERS IV SOLN
INTRAVENOUS | Status: DC | PRN
  Administered 2013-04-19 (×3): via INTRAVENOUS

## 2013-04-19 MED ORDER — HYDROMORPHONE HCL PF 1 MG/ML IJ SOLN
0.2500 mg | INTRAMUSCULAR | Status: DC | PRN
  Administered 2013-04-19 (×3): 0.5 mg via INTRAVENOUS

## 2013-04-19 MED ORDER — MIDAZOLAM HCL 5 MG/5ML IJ SOLN
INTRAMUSCULAR | Status: DC | PRN
  Administered 2013-04-19 (×2): 1 mg via INTRAVENOUS

## 2013-04-19 MED ORDER — SUCCINYLCHOLINE CHLORIDE 20 MG/ML IJ SOLN
INTRAMUSCULAR | Status: DC | PRN
  Administered 2013-04-19: 160 mg via INTRAVENOUS

## 2013-04-19 MED ORDER — HYDROCHLOROTHIAZIDE 12.5 MG PO CAPS
12.5000 mg | ORAL_CAPSULE | Freq: Two times a day (BID) | ORAL | Status: DC
  Administered 2013-04-19 – 2013-04-23 (×3): 12.5 mg via ORAL
  Filled 2013-04-19 (×9): qty 1

## 2013-04-19 MED ORDER — OXYCODONE HCL 5 MG PO TABS
5.0000 mg | ORAL_TABLET | ORAL | Status: DC | PRN
  Administered 2013-04-19 – 2013-04-25 (×10): 5 mg via ORAL
  Filled 2013-04-19 (×11): qty 1

## 2013-04-19 MED ORDER — MEPERIDINE HCL 50 MG/ML IJ SOLN
6.2500 mg | INTRAMUSCULAR | Status: DC | PRN
  Administered 2013-04-19 (×2): 6.25 mg via INTRAVENOUS

## 2013-04-19 MED ORDER — NEOSTIGMINE METHYLSULFATE 1 MG/ML IJ SOLN
INTRAMUSCULAR | Status: DC | PRN
  Administered 2013-04-19: 4 mg via INTRAVENOUS

## 2013-04-19 MED ORDER — LISINOPRIL 20 MG PO TABS
20.0000 mg | ORAL_TABLET | Freq: Two times a day (BID) | ORAL | Status: DC
  Administered 2013-04-19 – 2013-04-20 (×2): 20 mg via ORAL
  Filled 2013-04-19 (×7): qty 1

## 2013-04-19 MED ORDER — FENTANYL CITRATE 0.05 MG/ML IJ SOLN
INTRAMUSCULAR | Status: DC | PRN
  Administered 2013-04-19: 100 ug via INTRAVENOUS
  Administered 2013-04-19 (×3): 50 ug via INTRAVENOUS

## 2013-04-19 MED ORDER — ATORVASTATIN CALCIUM 40 MG PO TABS
40.0000 mg | ORAL_TABLET | Freq: Every morning | ORAL | Status: DC
  Administered 2013-04-20 – 2013-04-26 (×6): 40 mg via ORAL
  Filled 2013-04-19 (×7): qty 1

## 2013-04-19 MED ORDER — PROPOFOL 10 MG/ML IV BOLUS
INTRAVENOUS | Status: DC | PRN
  Administered 2013-04-19: 225 mg via INTRAVENOUS

## 2013-04-19 MED ORDER — GLYCOPYRROLATE 0.2 MG/ML IJ SOLN
INTRAMUSCULAR | Status: DC | PRN
  Administered 2013-04-19: .8 mg via INTRAVENOUS

## 2013-04-19 MED ORDER — PROMETHAZINE HCL 25 MG/ML IJ SOLN: 6.2500 mg | INTRAMUSCULAR | Status: DC | PRN

## 2013-04-19 MED ORDER — FENTANYL CITRATE 0.05 MG/ML IJ SOLN
INTRAMUSCULAR | Status: AC
  Filled 2013-04-19: qty 2

## 2013-04-19 MED ORDER — LACTATED RINGERS IV SOLN
INTRAVENOUS | Status: DC
  Administered 2013-04-19: 1000 mL via INTRAVENOUS

## 2013-04-19 MED ORDER — ACETAMINOPHEN 500 MG PO TABS
1000.0000 mg | ORAL_TABLET | Freq: Four times a day (QID) | ORAL | Status: AC
  Administered 2013-04-19 – 2013-04-20 (×4): 1000 mg via ORAL
  Filled 2013-04-19 (×4): qty 2

## 2013-04-19 MED ORDER — CISATRACURIUM BESYLATE (PF) 10 MG/5ML IV SOLN
INTRAVENOUS | Status: DC | PRN
  Administered 2013-04-19: 10 mg via INTRAVENOUS
  Administered 2013-04-19: 2 mg via INTRAVENOUS
  Administered 2013-04-19: 4 mg via INTRAVENOUS

## 2013-04-19 MED ORDER — HYDROMORPHONE HCL PF 1 MG/ML IJ SOLN
INTRAMUSCULAR | Status: AC
  Filled 2013-04-19: qty 1

## 2013-04-19 MED ORDER — ONDANSETRON HCL 4 MG/2ML IJ SOLN: 4.0000 mg | INTRAMUSCULAR | Status: DC | PRN

## 2013-04-19 MED ORDER — LIDOCAINE HCL (CARDIAC) 20 MG/ML IV SOLN
INTRAVENOUS | Status: DC | PRN
  Administered 2013-04-19: 100 mg via INTRAVENOUS

## 2013-04-19 MED ORDER — INSULIN ASPART 100 UNIT/ML ~~LOC~~ SOLN
4.0000 [IU] | Freq: Three times a day (TID) | SUBCUTANEOUS | Status: DC
  Administered 2013-04-19 – 2013-04-26 (×17): 4 [IU] via SUBCUTANEOUS

## 2013-04-19 MED ORDER — MEPERIDINE HCL 50 MG/ML IJ SOLN
INTRAMUSCULAR | Status: AC
  Filled 2013-04-19: qty 1

## 2013-04-19 MED ORDER — NEBIVOLOL HCL 5 MG PO TABS
5.0000 mg | ORAL_TABLET | Freq: Every morning | ORAL | Status: DC
  Administered 2013-04-22 – 2013-04-23 (×2): 5 mg via ORAL
  Filled 2013-04-19 (×4): qty 1

## 2013-04-19 SURGICAL SUPPLY — 63 items
BAG URINE DRAINAGE (UROLOGICAL SUPPLIES) ×6 IMPLANT
BAG URO CATCHER STRL LF (DRAPE) ×3 IMPLANT
BASKET ZERO TIP NITINOL 2.4FR (BASKET) IMPLANT
BENZOIN TINCTURE PRP APPL 2/3 (GAUZE/BANDAGES/DRESSINGS) ×6 IMPLANT
BLADE SURG 15 STRL LF DISP TIS (BLADE) ×2 IMPLANT
BLADE SURG 15 STRL SS (BLADE) ×1
CATH BEACON 5.038 65CM KMP-01 (CATHETERS) ×6 IMPLANT
CATH FOLEY 2W COUNCIL 20FR 5CC (CATHETERS) IMPLANT
CATH FOLEY 2WAY SLVR  5CC 16FR (CATHETERS)
CATH FOLEY 2WAY SLVR 5CC 16FR (CATHETERS) IMPLANT
CATH INTERMIT  6FR 70CM (CATHETERS) ×3 IMPLANT
CATH MULTI PURPOSE 16FR DRAIN (STENTS) ×3 IMPLANT
CATH ROBINSON RED A/P 20FR (CATHETERS) IMPLANT
CATH TIEMANN FOLEY 18FR 5CC (CATHETERS) ×3 IMPLANT
CATH X-FORCE N30 NEPHROSTOMY (TUBING) ×3 IMPLANT
CHLORAPREP W/TINT 26ML (MISCELLANEOUS) ×6 IMPLANT
CLOTH BEACON ORANGE TIMEOUT ST (SAFETY) ×3 IMPLANT
COVER SURGICAL LIGHT HANDLE (MISCELLANEOUS) ×3 IMPLANT
DRAPE C-ARM 42X120 X-RAY (DRAPES) ×3 IMPLANT
DRAPE CAMERA CLOSED 9X96 (DRAPES) ×6 IMPLANT
DRAPE LG THREE QUARTER DISP (DRAPES) ×6 IMPLANT
DRAPE LINGEMAN PERC (DRAPES) ×3 IMPLANT
DRAPE SURG IRRIG POUCH 19X23 (DRAPES) ×3 IMPLANT
DRSG TEGADERM 8X12 (GAUZE/BANDAGES/DRESSINGS) ×6 IMPLANT
GLOVE BIOGEL M STRL SZ7.5 (GLOVE) ×18 IMPLANT
GLOVE BIOGEL PI IND STRL 7.0 (GLOVE) ×2 IMPLANT
GLOVE BIOGEL PI INDICATOR 7.0 (GLOVE) ×1
GLOVE SURG SS PI 6.5 STRL IVOR (GLOVE) ×6 IMPLANT
GOWN STRL NON-REIN LRG LVL3 (GOWN DISPOSABLE) ×9 IMPLANT
GOWN STRL REIN XL XLG (GOWN DISPOSABLE) ×9 IMPLANT
GUIDEWIRE AMPLAZ .035X145 (WIRE) ×6 IMPLANT
GUIDEWIRE ANG ZIPWIRE 038X150 (WIRE) ×6 IMPLANT
GUIDEWIRE STR DUAL SENSOR (WIRE) ×3 IMPLANT
IV NS IRRIG 3000ML ARTHROMATIC (IV SOLUTION) ×9 IMPLANT
KIT BASIN OR (CUSTOM PROCEDURE TRAY) ×6 IMPLANT
LASER FIBER DISP (UROLOGICAL SUPPLIES) IMPLANT
LASER FIBER DISP 1000U (UROLOGICAL SUPPLIES) IMPLANT
MANIFOLD NEPTUNE II (INSTRUMENTS) ×3 IMPLANT
NEEDLE TROCAR 18X15 ECHO (NEEDLE) IMPLANT
NEEDLE TROCAR 18X20 (NEEDLE) ×3 IMPLANT
NS IRRIG 1000ML POUR BTL (IV SOLUTION) ×3 IMPLANT
PACK BASIC VI WITH GOWN DISP (CUSTOM PROCEDURE TRAY) ×3 IMPLANT
PACK CYSTO (CUSTOM PROCEDURE TRAY) ×3 IMPLANT
PAD ABD 7.5X8 STRL (GAUZE/BANDAGES/DRESSINGS) ×3 IMPLANT
PROBE LITHOCLAST ULTRA 3.8X403 (UROLOGICAL SUPPLIES) IMPLANT
PROBE PNEUMATIC 1.0MMX570MM (UROLOGICAL SUPPLIES) ×3 IMPLANT
SET IRRIG Y TYPE TUR BLADDER L (SET/KITS/TRAYS/PACK) ×3 IMPLANT
SET WARMING FLUID IRRIGATION (MISCELLANEOUS) ×3 IMPLANT
SPONGE GAUZE 4X4 12PLY (GAUZE/BANDAGES/DRESSINGS) ×3 IMPLANT
SPONGE LAP 4X18 X RAY DECT (DISPOSABLE) ×3 IMPLANT
STENT CONTOUR 6FRX26X.038 (STENTS) ×6 IMPLANT
STONE CATCHER W/TUBE ADAPTER (UROLOGICAL SUPPLIES) ×3 IMPLANT
SUT SILK 2 0 30  PSL (SUTURE) ×4
SUT SILK 2 0 30 PSL (SUTURE) ×8 IMPLANT
SUT SILK 3 0 SH 30 (SUTURE) IMPLANT
SUT VIC AB 0 CT1 27 (SUTURE) ×1
SUT VIC AB 0 CT1 27XBRD ANTBC (SUTURE) ×2 IMPLANT
SYR 20CC LL (SYRINGE) ×6 IMPLANT
SYRINGE 10CC LL (SYRINGE) ×3 IMPLANT
TOWEL OR 17X26 10 PK STRL BLUE (TOWEL DISPOSABLE) ×3 IMPLANT
TUBE CONNECTING VINYL 14FR 30C (MISCELLANEOUS) ×3 IMPLANT
TUBING CONNECTING 10 (TUBING) ×9 IMPLANT
WATER STERILE IRR 1500ML POUR (IV SOLUTION) ×3 IMPLANT

## 2013-04-19 NOTE — Brief Op Note (Signed)
04/19/2013  3:07 PM  PATIENT:  Alex Bowman  67 y.o. male  PRE-OPERATIVE DIAGNOSIS:  RIGHT STAGHORN AND  LEFT  RENAL STONES  POST-OPERATIVE DIAGNOSIS:  right staghorn and left renal stones  PROCEDURE:  Cystoscopy with bilateral retrograde pyleograms and ureteral stents, Rt 1st stage percutaneous nephrostolithotomy >2cm, Rt antegrade nephrstogram, Rt acces to kidney with dilation of tract.   SURGEON:  Surgeon(s) and Role:    * Sebastian Ache, MD - Primary  PHYSICIAN ASSISTANT:   ASSISTANTS: none   ANESTHESIA:   general  EBL:  Total I/O In: 2000 [I.V.:2000] Out: -   BLOOD ADMINISTERED:none  DRAINS: Rt nephrostomy tube, Rt nephroureteral stent (capped), Foley catheter   LOCAL MEDICATIONS USED:  NONE  SPECIMEN:  Source of Specimen:  Rt Renal Stones  DISPOSITION OF SPECIMEN:  [facility] for compositional analysis  COUNTS:  YES  TOURNIQUET:  * No tourniquets in log *  DICTATION: .Other Dictation: Dictation Number [id number]  PLAN OF CARE: Admit to inpatient   PATIENT DISPOSITION:  PACU - hemodynamically stable.   Delay start of Pharmacological VTE agent (>24hrs) due to surgical blood loss or risk of bleeding: yes

## 2013-04-19 NOTE — H&P (Signed)
Alex Bowman is an 67 y.o. male.    Chief Complaint: Pre Op Bilateral Multi-Stage Surgery For Rt > Lt Large Volume Kidney Stones  HPI:   1 - Rt > Lt Large Volume Nephrolithiasis - Pt with rt partial staghorn (4cm mostly lowe-mid, preserved parenchyma, 900HU) and left lower pole scattered (8mm volume) found on ER CT 02/2013 on w/u right flank pain. Had 1 prior episode colic with small stone that passed with mediical therapy. Cr 1.65, Most recent UA from ER withotu infectious parameters.  2 - Renal Insuficiency - Cr 1.65 at ER 02/2013. Mild rt lower pole hydro from stone as per above.   PMH sig for DM2, Morbid Obesity, OA (uses cane). No CV disease. No blood thinners. His PCP is [name] of [facility].   Today Alex Bowman is seen to proceed with first stage surgery for his large kidney stones. Most recent UCX from [facility] with low-growth / non-specific colonization and he has been on pre-op ABX. No interval fevers.  Past Medical History  Diagnosis Date  . Hypertension   . Knee pain     R>L  . Morbid obesity with BMI of 45.0-49.9, adult     Attempted weight loss efforts at last visit had BMI down to 42, unfortunately back up to 46 with weight going up to 312 lb from 285 lb  . Dyslipidemia     Monitored by Primary Physician. On statin  . Bilateral lower extremity edema     Chronic, with venous insufficiency  . GERD (gastroesophageal reflux disease)   . Pneumonia 20 years ago    h/o  . Diabetes mellitus without complication     "on medicine, but numbers have been good for years since weight loss"  . Arthritis   . Complication of anesthesia     stayed awake during colonoscopy    Past Surgical History  Procedure Laterality Date  . Nm myoview ltd; persantine  May 2012    Suggested as high risk with 3 times a day 1.2 to, mild to moderate 3 times a day affecting basolateral and mid lateral walls.  . Cardiac catheterization  May 2012    Mild to moderate CAD: 30% RCA,  30-40% circumflex. Mild elevation in LVDP, EF 55%. --> False-positive stress test  . Hand surgery Bilateral 15 years ago    No family history on file. Social History:  reports that he quit smoking about 16 years ago. His smoking use included Cigarettes. He has a 30 pack-year smoking history. He quit smokeless tobacco use about 10 years ago. His smokeless tobacco use included Chew. He reports that he does not drink alcohol or use illicit drugs.  Allergies: No Known Allergies  No prescriptions prior to admission    No results found for this or any previous visit (from the past 48 hour(s)). No results found.  Review of Systems  Constitutional: Negative.  Negative for fever and chills.  HENT: Negative.   Eyes: Negative.   Respiratory: Negative.   Cardiovascular: Negative.   Gastrointestinal: Negative.   Genitourinary: Positive for flank pain. Negative for hematuria.  Musculoskeletal: Negative.   Skin: Negative.   Neurological: Negative.   Endo/Heme/Allergies: Negative.   Psychiatric/Behavioral: Negative.     There were no vitals taken for this visit. Physical Exam  Constitutional: He is oriented to person, place, and time. He appears well-developed and well-nourished.  HENT:  Head: Normocephalic and atraumatic.  Eyes: EOM are normal. Pupils are equal, round, and reactive to  light.  Neck: Normal range of motion. Neck supple.  Cardiovascular: Normal rate.   Respiratory: Effort normal.  GI: Soft. Bowel sounds are normal.  Morbid obesity  Genitourinary: Penis normal.  Musculoskeletal: Normal range of motion.  Neurological: He is alert and oriented to person, place, and time.  Uses cane  Skin: Skin is warm and dry.  Psychiatric: He has a normal mood and affect. His behavior is normal. Judgment and thought content normal.     Assessment/Plan  1 - Rt > Lt Large Volume Nephrolithiasis - Plan for two stage surgery as follows: I - Cysto bilat stents and Rt first stage PCNL with  surgeon access (today) II - Rt second stage PCNL and Left URS 2 days later ([place] 9/10)  We rediscussed percutaneous nephrostolithotomy (PCNL) in detail including need for percutaneous access which is sometimes gained by the surgeon, and other times by radiology or through existing nephrostomy tubes if present. We specifically rediscussed that often times tubes remain in after surgery until we are confident all stone has been treated. We reentioned that staged surgery is needed in over 50% of cases of very large or complex stone. We then rediscussed general risks including bleeding, infection, damage to kidney / ureter / bladder, loss of kidney, as well as anesthetic risks and rare but serious surgical complications including DVT, PE, MI, and mortality. He voiced understanding and wants to proceed today as scheduled.  2 - Renal Insuficiency - Likely medical renal disesae with some obstructive component on right side.   [name] 04/19/2013, 7:41 AM

## 2013-04-19 NOTE — Anesthesia Preprocedure Evaluation (Addendum)
Anesthesia Evaluation  Patient identified by MRN, date of birth, ID band Patient awake    Reviewed: Allergy & Precautions, H&P , NPO status , Patient's Chart, lab work & pertinent test results  History of Anesthesia Complications Negative for: history of anesthetic complications  Airway Mallampati: III TM Distance: >3 FB Neck ROM: Full    Dental no notable dental hx.    Pulmonary neg pulmonary ROS, sleep apnea , former smoker,  breath sounds clear to auscultation  Pulmonary exam normal       Cardiovascular hypertension, Pt. on medications negative cardio ROS  Rhythm:Regular Rate:Normal     Neuro/Psych negative neurological ROS  negative psych ROS   GI/Hepatic negative GI ROS, Neg liver ROS,   Endo/Other  negative endocrine ROSdiabetes, Type 2, Oral Hypoglycemic AgentsMorbid obesity  Renal/GU negative Renal ROS  negative genitourinary   Musculoskeletal negative musculoskeletal ROS (+)   Abdominal   Peds negative pediatric ROS (+)  Hematology negative hematology ROS (+)   Anesthesia Other Findings   Reproductive/Obstetrics negative OB ROS                          Anesthesia Physical Anesthesia Plan  ASA: III  Anesthesia Plan: General   Post-op Pain Management:    Induction: Intravenous  Airway Management Planned: Oral ETT  Additional Equipment:   Intra-op Plan:   Post-operative Plan: Extubation in OR  Informed Consent: I have reviewed the patients History and Physical, chart, labs and discussed the procedure including the risks, benefits and alternatives for the proposed anesthesia with the patient or authorized representative who has indicated his/her understanding and acceptance.   Dental advisory given  Plan Discussed with: CRNA  Anesthesia Plan Comments:         Anesthesia Quick Evaluation

## 2013-04-19 NOTE — Transfer of Care (Signed)
Immediate Anesthesia Transfer of Care Note  Patient: Alex Bowman  Procedure(s) Performed: Procedure(s) with comments: NEPHROLITHOTOMY PERCUTANEOUS (Right) CYSTOSCOPY WITH STENT PLACEMENT (Left) - RIGHT RETROGRADE PYELOGRAM  Patient Location: PACU  Anesthesia Type:General  Level of Consciousness: awake, alert , oriented and patient cooperative  Airway & Oxygen Therapy: Patient Spontanous Breathing and Patient connected to face mask oxygen  Post-op Assessment: Report given to PACU RN and Post -op Vital signs reviewed and stable  Post vital signs: Reviewed and stable  Complications: No apparent anesthesia complications

## 2013-04-19 NOTE — Anesthesia Postprocedure Evaluation (Signed)
  Anesthesia Post-op Note  Patient: Alex Bowman  Procedure(s) Performed: Procedure(s) (LRB): NEPHROLITHOTOMY PERCUTANEOUS (Right) CYSTOSCOPY WITH STENT PLACEMENT (Left)  Patient Location: PACU  Anesthesia Type: General  Level of Consciousness: awake and alert   Airway and Oxygen Therapy: Patient Spontanous Breathing  Post-op Pain: mild  Post-op Assessment: Post-op Vital signs reviewed, Patient's Cardiovascular Status Stable, Respiratory Function Stable, Patent Airway and No signs of Nausea or vomiting  Last Vitals:  Filed Vitals:   04/19/13 1600  BP:   Pulse: 61  Temp: 36.3 C  Resp: 14    Post-op Vital Signs: stable   Complications: No apparent anesthesia complications

## 2013-04-19 NOTE — Anesthesia Procedure Notes (Signed)
Procedure Name: Intubation Date/Time: 04/19/2013 12:47 PM Performed by: Edison Pace Pre-anesthesia Checklist: Patient identified, Timeout performed, Emergency Drugs available, Suction available and Patient being monitored Patient Re-evaluated:Patient Re-evaluated prior to inductionOxygen Delivery Method: Circle system utilized Preoxygenation: Pre-oxygenation with 100% oxygen Intubation Type: IV induction and Cricoid Pressure applied Ventilation: Mask ventilation without difficulty Laryngoscope Size: Mac and 4 Grade View: Grade I Tube type: Oral Tube size: 8.0 mm Number of attempts: 1 Airway Equipment and Method: Stylet Placement Confirmation: ETT inserted through vocal cords under direct vision,  positive ETCO2 and breath sounds checked- equal and bilateral Secured at: 21 cm Tube secured with: Tape Dental Injury: Teeth and Oropharynx as per pre-operative assessment  Difficulty Due To: Difficulty was anticipated, Difficult Airway- due to large tongue and Difficult Airway- due to reduced neck mobility Future Recommendations: Recommend- induction with short-acting agent, and alternative techniques readily available

## 2013-04-20 ENCOUNTER — Encounter (HOSPITAL_COMMUNITY): Payer: Self-pay | Admitting: Anesthesiology

## 2013-04-20 LAB — GLUCOSE, CAPILLARY
Glucose-Capillary: 98 mg/dL (ref 70–99)
Glucose-Capillary: 114 mg/dL — ABNORMAL HIGH (ref 70–99)

## 2013-04-20 MED ORDER — MENTHOL 3 MG MT LOZG
1.0000 | LOZENGE | OROMUCOSAL | Status: DC | PRN
  Administered 2013-04-20: 3 mg via ORAL
  Filled 2013-04-20 (×2): qty 9

## 2013-04-20 MED ORDER — INFLUENZA VAC SPLIT QUAD 0.5 ML IM SUSP
0.5000 mL | Freq: Once | INTRAMUSCULAR | Status: AC
  Administered 2013-04-22: 10:00:00 0.5 mL via INTRAMUSCULAR
  Filled 2013-04-20 (×2): qty 0.5

## 2013-04-20 MED ORDER — PIPERACILLIN-TAZOBACTAM 3.375 G IVPB 30 MIN
3.3750 g | INTRAVENOUS | Status: AC
  Administered 2013-04-21: 3.375 g via INTRAVENOUS
  Filled 2013-04-20 (×3): qty 50

## 2013-04-20 NOTE — Anesthesia Preprocedure Evaluation (Signed)
Anesthesia Evaluation  Patient identified by MRN, date of birth, ID band Patient awake    Reviewed: Allergy & Precautions, H&P , NPO status , Patient's Chart, lab work & pertinent test results  Airway Mallampati: II TM Distance: >3 FB Neck ROM: Full    Dental no notable dental hx.    Pulmonary sleep apnea , pneumonia -, resolved,  CXR: NAD breath sounds clear to auscultation  Pulmonary exam normal       Cardiovascular hypertension, Pt. on medications and Pt. on home beta blockers negative cardio ROS  Rhythm:Regular Rate:Normal  ECG: SB 57   Neuro/Psych negative neurological ROS  negative psych ROS   GI/Hepatic Neg liver ROS, GERD-  ,  Endo/Other  diabetes, Type 2, Oral Hypoglycemic AgentsMorbid obesity  Renal/GU negative Renal ROS  negative genitourinary   Musculoskeletal negative musculoskeletal ROS (+)   Abdominal   Peds negative pediatric ROS (+)  Hematology negative hematology ROS (+)   Anesthesia Other Findings   Reproductive/Obstetrics negative OB ROS                           Anesthesia Physical Anesthesia Plan  ASA: III  Anesthesia Plan: General   Post-op Pain Management:    Induction: Intravenous  Airway Management Planned: Oral ETT  Additional Equipment:   Intra-op Plan:   Post-operative Plan: Extubation in OR  Informed Consent: I have reviewed the patients History and Physical, chart, labs and discussed the procedure including the risks, benefits and alternatives for the proposed anesthesia with the patient or authorized representative who has indicated his/her understanding and acceptance.   Dental advisory given  Plan Discussed with: CRNA  Anesthesia Plan Comments: (No difficulty with intubation on 04-19-13)        Anesthesia Quick Evaluation

## 2013-04-20 NOTE — Progress Notes (Signed)
1 Day Post-Op  Subjective: 1 - Rt > Lt Large Volume Nephrolithiasis - Pt with rt partial staghorn (4cm mostly lowe-mid, preserved parenchyma, 900HU) and left lower pole scattered (8mm volume) found on ER CT 02/2013 on w/u right flank pain. Had 1 prior episode colic with small stone that passed with mediical therapy. Now s/p Rt first stage percutanous nephrostolithtoomy with left ureteral stent placement 9/8 with plan to perform second stage right PCNL and left sided uretroscopic stone manipulation tomorrow.   2 - Renal Insuficiency - Cr 1.65 at ER 02/2013. Mild rt lower pole hydro from stone as per above. Cr 04/19/13 1.8 range.  Today Alex Bowman is without complaints. Tollerating diet, Hgb acceptable, ambulatory and pain controlled. No fevers.  Objective: Vital signs in last 24 hours: Temp:  [97.3 F (36.3 C)-98.2 F (36.8 C)] 98.2 F (36.8 C) (09/09 5284) Pulse Rate:  [61-74] 66 (09/09 0614) Resp:  [12-22] 16 (09/09 0614) BP: (90-177)/(40-91) 90/40 mmHg (09/09 0626) SpO2:  [94 %-100 %] 94 % (09/09 0614) Weight:  [137.6 kg (303 lb 5.7 oz)] 137.6 kg (303 lb 5.7 oz) (09/08 1637) Last BM Date: 04/18/13  Intake/Output from previous day: 09/08 0701 - 09/09 0700 In: 3542.5 [P.O.:720; I.V.:2822.5] Out: 1960 [Urine:1960] Intake/Output this shift:    General appearance: alert, cooperative, appears stated age and morbidly obese Head: Normocephalic, without obvious abnormality, atraumatic Eyes: conjunctivae/corneas clear. PERRL, EOM's intact. Fundi benign. Ears: normal TM's and external ear canals both ears Nose: Nares normal. Septum midline. Mucosa normal. No drainage or sinus tenderness. Throat: lips, mucosa, and tongue normal; teeth and gums normal Neck: no adenopathy, no carotid bruit, no JVD, supple, symmetrical, trachea midline and thyroid not enlarged, symmetric, no tenderness/mass/nodules Back: symmetric, no curvature. ROM normal. No CVA tenderness. Resp: clear to auscultation  bilaterally Chest wall: no tenderness Cardio: regular rate and rhythm, S1, S2 normal, no murmur, click, rub or gallop GI: soft, non-tender; bowel sounds normal; no masses,  no organomegaly Male genitalia: normal Extremities: extremities normal, atraumatic, no cyanosis or edema Pulses: 2+ and symmetric Skin: Skin color, texture, turgor normal. No rashes or lesions Lymph nodes: Cervical, supraclavicular, and axillary nodes normal. Neurologic: Grossly normal Incision/Wound: Rt nephrostomy c/d/i with pink urine. Foley c/d/i with pink urine. No flank ecchymoses.  Lab Results:   Recent Labs  04/19/13 1523 04/20/13 0428  WBC 5.5  --   HGB 14.1 12.7*  HCT 41.3 38.0*  PLT 180  --    BMET  Recent Labs  04/19/13 1523  NA 137  K 4.2  CL 99  CO2 26  GLUCOSE 111*  BUN 21  CREATININE 1.84*  CALCIUM 9.4   PT/INR No results found for this basename: LABPROT, INR,  in the last 72 hours ABG No results found for this basename: PHART, PCO2, PO2, HCO3,  in the last 72 hours  Studies/Results: Dg Abd 1 View  04/19/2013   *RADIOLOGY REPORT*  Clinical Data: Renal calculus  DG C-ARM 1-60 MIN - NRPT [facility],ABDOMEN - 1 VIEW  Comparison: None.  Findings: Images are not labeled as to right or left.  This images demonstrate intraoperative prepped and venous lithotomy.  The left ureteral stent is coiled over the bladder.  An Amplatz wire extends into the bladder from the right ureter.  The final image demonstrates a nephrostomy catheter projecting over the right renal pelvis.  IMPRESSION: Intraoperative percutaneous nephrolithotomy of what is presumed to be the right kidney.   Original Report Authenticated By: Jolaine Click, M.D.   Dg  Retrograde Pyelogram  04/19/2013   *RADIOLOGY REPORT*  Clinical data:   Right renal calculus and left stent placement  INTRAOPERATIVE BILATERAL RETROGRADE UROGRAPHY  Comparison:  02/15/2013  Technique:  Images were obtained with the C-arm fluoroscopic device intraoperatively and  submitted for interpretation post-operatively. Please see the procedural report for the amount of contrast and the fluoroscopy time utilized.  Findings:  Imaging demonstrates cannulation of both ureteral orifices and contrast filling the ureters and renal collecting systems.  A left double-J ureteral stent has been placed.  The filling defect in the right renal pelvis compatible with a calculus is noted.  IMPRESSION: Left ureteral stent placement.  Right renal calculus.   Original Report Authenticated By: Jolaine Click, M.D.   Dg C-arm 1-60 Min-no Report  04/19/2013   *RADIOLOGY REPORT*  Clinical Data: Renal calculus  DG C-ARM 1-60 MIN - NRPT [facility],ABDOMEN - 1 VIEW  Comparison: None.  Findings: Images are not labeled as to right or left.  This images demonstrate intraoperative prepped and venous lithotomy.  The left ureteral stent is coiled over the bladder.  An Amplatz wire extends into the bladder from the right ureter.  The final image demonstrates a nephrostomy catheter projecting over the right renal pelvis.  IMPRESSION: Intraoperative percutaneous nephrolithotomy of what is presumed to be the right kidney.   Original Report Authenticated By: Jolaine Click, M.D.    Anti-infectives: Anti-infectives   Start     Dose/Rate Route Frequency Ordered Stop   04/19/13 0915  piperacillin-tazobactam (ZOSYN) IVPB 3.375 g     3.375 g 100 mL/hr over 30 Minutes Intravenous  Once 04/19/13 0850 04/19/13 1230      Assessment/Plan:  1 - Rt > Lt Large Volume Nephrolithiasis - Doing well s/p first stage surgery, plan for second stage tomorrow as scheduled. NPO p MN. Consent.  2 - Renal Insuficiency - electrolytes and UOP acceptable, continue to monitor.   [facility], Yesennia Hirota 04/20/2013

## 2013-04-20 NOTE — Progress Notes (Signed)
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 He has good spiritual support from other pastors (4 today). He seems to be in good spirits. Presence; listening.

## 2013-04-20 NOTE — Care Management Note (Signed)
    Page 1 of 1   04/20/2013     1:57:14 PM   CARE MANAGEMENT NOTE 04/20/2013  Patient:  Alex Bowman, Alex Bowman   Account Number:  [id number]  Date Initiated:  04/20/2013  Documentation initiated by:  Lanier Clam  Subjective/Objective Assessment:   67 Y/O M ADMITTED W/R NEPHROLITHIASIS.     Action/Plan:   FROM HOME.   Anticipated DC Date:  04/21/2013   Anticipated DC Plan:  HOME/SELF CARE      DC Planning Services  CM consult      Choice offered to / List presented to:             Status of service:  In process, will continue to follow Medicare Important Message given?   (If response is "NO", the following Medicare IM given date fields will be blank) Date Medicare IM given:   Date Additional Medicare IM given:    Discharge Disposition:    Per UR Regulation:  Reviewed for med. necessity/level of care/duration of stay  If discussed at Long Length of Stay Meetings, dates discussed:    Comments:  04/20/13 Delcie Ruppert RN,BSN NCM [phone] POD#1 R PCN DRAIN.FOR L PCN DRAIN IN AM.

## 2013-04-20 NOTE — Op Note (Signed)
NAMELASHON, HILLIER NO.:  [id number]  MEDICAL RECORD NO.:  [id number]  LOCATION:  1419                         FACILITY:  [facility]  PHYSICIAN:  Alex Ache, MD     DATE OF BIRTH:  07-15-1946  DATE OF PROCEDURE:  04/19/2013 DATE OF DISCHARGE:                              OPERATIVE REPORT   DIAGNOSIS:  Right staghorn kidney stone with left smaller renal stones.  PROCEDURE: 1. Cystoscopy with bilateral retrograde pyelogram interpretation,     insertion of bilateral ureteral stents. 2. Needle access to right kidney. 3. Dilation of right percutaneous tract. 4. Right percutaneous nephrostolithotomy stone greater than 2 cm. 5. Right antegrade nephrostogram interpretation. 6. Insertion of right nephrostomy tube.  ESTIMATED BLOOD LOSS:  Less than 200 mL.  COMPLICATIONS:  None.  SPECIMEN:  Right renal stone fragments for compositional analysis.  DRAINS: 1. Right nephrostomy straight drain. 2. Right nephroureteral stent capped. 3. Foley catheter straight drain.  INDICATION:  Alex Bowman is a pleasant 67 year old gentleman, who was found to have right greater than left large volume nephrolithiasis consistent with right partial staghorn kidney stone and left smaller renal stone.  Options were discussed for management including observation versus shockwave approaches versus maximally aggressive therapy with right percutaneous than left ureteroscopic approach.  The patient wished to proceed with latter in a staged fashion.  Informed consent was obtained and placed in medical record.  PROCEDURE IN DETAIL:  The patient being Alex Bowman was verified, and the procedure being cystoscopy, bilateral stents, right percutaneous nephrostolithotomy was confirmed.  Procedure was carried out.  Time-out was performed.  Intravenous antibiotics were administered.  General endotracheal anesthesia was introduced.  The patient was placed into a low lithotomy position.  Sterile  field was created by prepping and draping the patient's penis, perineum, and proximal thighs using iodine x3.  Next, cystourethroscopy was performed using a 22-French rigid cystoscope with 12-degree offset lens.  Inspection of anterior and posterior urethra unremarkable.  Inspection of the bladder revealed no diverticula, calcifications, or papular lesions.  The left ureteral orifice was gently cannulated with a 6-French end-hole catheter and left retrograde pyelogram obtained.  Left retrograde pyelogram demonstrated a single left ureter, single system, left kidney.  No filling defects or narrowing.  A 0.038 Glidewire was advanced slowly at the level of the upper pole, over which, a new 6 x 26 double-J stent was placed.  Good proximal and distal curl were noted.  Attention was then directed at the right side.  The right ureteral orifice was cannulated with 6-French end-hole catheter and right retrograde was obtained.  Right retrograde pyelogram demonstrated a single right ureter, single system, right kidney.  There was a large multifocal filling defect on the right side consistent with large renal stone and several smaller caliceal stones.  A 0.038 Glidewire was advanced at the level of the renal pelvis and the end-hole catheter was advanced to the level of the mid pelvis acting as an externalized nephroureteral stent.  An 18-French coude Foley catheter was placed per urethra, straight drain 10 mL of sterile water in the balloon, and the nephroureteral catheter was fashioned to this.  The patient was then  repositioned into the prone position employing chest rolls, axillary rolls, prone view, padding of the knees and ankles in mild stable flexion, thus maximizing distance and 12th rib, and iliac crest.  A new sterile field was created by prepping and draping the patient's entire right flank and right buttocks area using chlorhexidine gluconate and I re-prepped and draped.   Next, performing simultaneous retrograde pyelography via the nephroureteral stent, and fluoroscopy at 25 degrees off vertical.  A suitable calyx was found in the lower pole for percutaneous access and using bull's eye technique, an 18-gauge Chiba needle was carefully advanced into this location, as verified by echo PC efflux of clear urine.  A 0.038 angle tip Glidewire was then navigated via this tract down the level of the right ureter, and exchanged for a Super Stiff wire via an KMP catheter. The wire tract was then carefully dilated at the level of the fascia using hemostats under fluoroscopic guidance, and the coaxial introducer was placed using continuous fluoroscopic vision to the level the proximal ureter, through which a separate Glidewire was advanced to level of the urinary bladder and exchanged for a Super Stiff wire via the KMP catheter.  Now with the 2 Super Stiff wires in place, the percutaneous drape was applied.  The coaxial introducer was removed, 1 Super Stiff wire was set aside as a safety wire.  Next, the NephroMax balloon dilation apparatus was carefully positioned across the lower pole calyx, inflated to a pressure of 18 atmospheres, held for 90 seconds and the access sheath was carefully placed across this area. Next, rigid nephroscopy was performed via this tract.  The sheath was in excellent position in the lower pole and direct apposition to the large renal pelvis stone at the inferior portion of this.  There were also several caliceal stones noted in this location.  Lithoclast dual ultrasound pneumatic energy was applied to the stone for approximately an hour and a half, fragmenting the dominant renal pelvis stone into smaller fragments and suctioning the majority of them.  The smaller fragments were set aside for compositional analysis being grasped with rigid graspers.  This technique also allowed addressing the lower pole caliceal stones times several.  This  ablated approximately what appeared to be 90% or more of the calcifications by fluoroscopy. There revealed to be 1 questionable residual calcification that was an acute angle to the sheath and felt that this area would likely be better addressed during second stage procedure at which the sheath would not be used allowing for better flexion and flexible instrumentation.  We achieved the goals of the first stage procedure today and as such a new KMP catheter was advanced down the working wire, setting aside as a nephroureteral stent.  A 16-French nephrostomy tube was placed using fluoroscopic guidance via the tract, coiled in level of renal pelvis. Final antegrade nephrostogram was obtained.  Final antegrade nephrostogram of the right revealed excellent placement of the nephrostomy tube within the renal pelvis.  No excessive extravasation and good placement of the nephroureteral stent with contrast continuity at the level of the urinary bladder.  The sheath was then removed, and the tubes were anchored in place via interrupted silk. There is no excessive bleeding.  Percutaneous dressing was applied.  The previous externalized ureteral stent was removed leaving the Foley catheter in place.  Procedure was terminated.  The patient tolerated the procedure well.  There were no immediate periprocedural complications.  The patient was taken to the postanesthesia care unit in  stable condition.          ______________________________ Alex Ache, MD     TM/MEDQ  D:  04/19/2013  T:  04/20/2013  Job:  [id number]

## 2013-04-21 ENCOUNTER — Inpatient Hospital Stay (HOSPITAL_COMMUNITY): Payer: Medicare Other

## 2013-04-21 ENCOUNTER — Encounter (HOSPITAL_COMMUNITY)
Admission: RE | Disposition: A | Discharge status: Home / Self Care | Payer: Self-pay | Source: Ambulatory Visit | Attending: Urology

## 2013-04-21 ENCOUNTER — Encounter (HOSPITAL_COMMUNITY): Payer: Self-pay | Admitting: Anesthesiology

## 2013-04-21 ENCOUNTER — Inpatient Hospital Stay (HOSPITAL_COMMUNITY): Payer: Medicare Other | Admitting: Anesthesiology

## 2013-04-21 HISTORY — PX: CYSTOSCOPY WITH URETEROSCOPY: SHX5123

## 2013-04-21 HISTORY — PX: HOLMIUM LASER APPLICATION: SHX5852

## 2013-04-21 HISTORY — PX: NEPHROLITHOTOMY: SHX5134

## 2013-04-21 LAB — GLUCOSE, CAPILLARY
Glucose-Capillary: 96 mg/dL (ref 70–99)
Glucose-Capillary: 118 mg/dL — ABNORMAL HIGH (ref 70–99)

## 2013-04-21 SURGERY — NEPHROLITHOTOMY PERCUTANEOUS
Anesthesia: General | Site: Ureter | Laterality: Right

## 2013-04-21 MED ORDER — LACTATED RINGERS IV SOLN
INTRAVENOUS | Status: DC | PRN
  Administered 2013-04-21: 09:00:00 via INTRAVENOUS

## 2013-04-21 MED ORDER — NEOSTIGMINE METHYLSULFATE 1 MG/ML IJ SOLN
INTRAMUSCULAR | Status: DC | PRN
  Administered 2013-04-21: 5 mg via INTRAVENOUS

## 2013-04-21 MED ORDER — LIDOCAINE HCL (CARDIAC) 20 MG/ML IV SOLN
INTRAVENOUS | Status: DC | PRN
  Administered 2013-04-21: 30 mg via INTRAVENOUS

## 2013-04-21 MED ORDER — PROMETHAZINE HCL 25 MG/ML IJ SOLN: 6.2500 mg | INTRAMUSCULAR | Status: DC | PRN

## 2013-04-21 MED ORDER — PROPOFOL 10 MG/ML IV BOLUS
INTRAVENOUS | Status: DC | PRN
  Administered 2013-04-21 (×4): 10 mg via INTRAVENOUS
  Administered 2013-04-21: 200 mg via INTRAVENOUS
  Administered 2013-04-21 (×3): 10 mg via INTRAVENOUS

## 2013-04-21 MED ORDER — CISATRACURIUM BESYLATE (PF) 10 MG/5ML IV SOLN
INTRAVENOUS | Status: DC | PRN
  Administered 2013-04-21: 5 mg via INTRAVENOUS
  Administered 2013-04-21: 10 mg via INTRAVENOUS

## 2013-04-21 MED ORDER — BELLADONNA ALKALOIDS-OPIUM 16.2-60 MG RE SUPP: 1.0000 | Freq: Four times a day (QID) | RECTAL | Status: DC | PRN

## 2013-04-21 MED ORDER — HYDROMORPHONE HCL PF 1 MG/ML IJ SOLN
INTRAMUSCULAR | Status: DC | PRN
  Administered 2013-04-21: 0.5 mg via INTRAVENOUS

## 2013-04-21 MED ORDER — KETAMINE HCL 10 MG/ML IJ SOLN
INTRAMUSCULAR | Status: DC | PRN
  Administered 2013-04-21 (×2): 10 mg via INTRAVENOUS
  Administered 2013-04-21: 5 mg via INTRAVENOUS

## 2013-04-21 MED ORDER — FENTANYL CITRATE 0.05 MG/ML IJ SOLN: 25.0000 ug | INTRAMUSCULAR | Status: DC | PRN

## 2013-04-21 MED ORDER — FENTANYL CITRATE 0.05 MG/ML IJ SOLN
INTRAMUSCULAR | Status: DC | PRN
  Administered 2013-04-21: 50 ug via INTRAVENOUS
  Administered 2013-04-21: 25 ug via INTRAVENOUS
  Administered 2013-04-21: 75 ug via INTRAVENOUS
  Administered 2013-04-21: 100 ug via INTRAVENOUS

## 2013-04-21 MED ORDER — SODIUM CHLORIDE 0.9 % IR SOLN
Status: DC | PRN
  Administered 2013-04-21: 1000 mL

## 2013-04-21 MED ORDER — 0.9 % SODIUM CHLORIDE (POUR BTL) OPTIME
TOPICAL | Status: DC | PRN
  Administered 2013-04-21: 1000 mL

## 2013-04-21 MED ORDER — IOHEXOL 300 MG/ML  SOLN
INTRAMUSCULAR | Status: DC | PRN
  Administered 2013-04-21: 10:00:00 35 mL

## 2013-04-21 MED ORDER — GLYCOPYRROLATE 0.2 MG/ML IJ SOLN
INTRAMUSCULAR | Status: DC | PRN
  Administered 2013-04-21: 0.6 mg via INTRAVENOUS

## 2013-04-21 MED ORDER — SUCCINYLCHOLINE CHLORIDE 20 MG/ML IJ SOLN
INTRAMUSCULAR | Status: DC | PRN
  Administered 2013-04-21: 160 mg via INTRAVENOUS

## 2013-04-21 MED ORDER — EPHEDRINE SULFATE 50 MG/ML IJ SOLN
INTRAMUSCULAR | Status: DC | PRN
  Administered 2013-04-21 (×2): 10 mg via INTRAVENOUS

## 2013-04-21 MED ORDER — DEXAMETHASONE SODIUM PHOSPHATE 10 MG/ML IJ SOLN
INTRAMUSCULAR | Status: DC | PRN
  Administered 2013-04-21: 10 mg via INTRAVENOUS

## 2013-04-21 MED ORDER — MIDAZOLAM HCL 5 MG/5ML IJ SOLN
INTRAMUSCULAR | Status: DC | PRN
  Administered 2013-04-21: 0.5 mg via INTRAVENOUS

## 2013-04-21 MED ORDER — SODIUM CHLORIDE 0.9 % IR SOLN
Status: DC | PRN
  Administered 2013-04-21: 6000 mL

## 2013-04-21 MED ORDER — ONDANSETRON HCL 4 MG/2ML IJ SOLN
INTRAMUSCULAR | Status: DC | PRN
  Administered 2013-04-21: 4 mg via INTRAVENOUS

## 2013-04-21 MED ORDER — ACETAMINOPHEN 10 MG/ML IV SOLN
1000.0000 mg | Freq: Four times a day (QID) | INTRAVENOUS | Status: AC
  Administered 2013-04-21 – 2013-04-22 (×4): 1000 mg via INTRAVENOUS
  Filled 2013-04-21 (×4): qty 100

## 2013-04-21 MED ORDER — LACTATED RINGERS IV SOLN
INTRAVENOUS | Status: DC | PRN
  Administered 2013-04-21 (×2): via INTRAVENOUS

## 2013-04-21 SURGICAL SUPPLY — 59 items
BAG URINE DRAINAGE (UROLOGICAL SUPPLIES) ×3 IMPLANT
BASKET LASER NITINOL 1.9FR (BASKET) ×6 IMPLANT
BASKET ZERO TIP NITINOL 2.4FR (BASKET) IMPLANT
BENZOIN TINCTURE PRP APPL 2/3 (GAUZE/BANDAGES/DRESSINGS) ×3 IMPLANT
BLADE SURG 15 STRL LF DISP TIS (BLADE) ×2 IMPLANT
BLADE SURG 15 STRL SS (BLADE) ×1
CATH BEACON 5.038 65CM KMP-01 (CATHETERS) IMPLANT
CATH FOLEY 2W COUNCIL 20FR 5CC (CATHETERS) IMPLANT
CATH FOLEY 2WAY SLVR  5CC 16FR (CATHETERS) ×1
CATH FOLEY 2WAY SLVR 5CC 16FR (CATHETERS) ×2 IMPLANT
CATH INTERMIT  6FR 70CM (CATHETERS) ×3 IMPLANT
CATH ROBINSON RED A/P 20FR (CATHETERS) IMPLANT
CATH X-FORCE N30 NEPHROSTOMY (TUBING) IMPLANT
CHLORAPREP W/TINT 26ML (MISCELLANEOUS) ×3 IMPLANT
CLOTH BEACON ORANGE TIMEOUT ST (SAFETY) ×3 IMPLANT
COVER SURGICAL LIGHT HANDLE (MISCELLANEOUS) ×3 IMPLANT
DRAPE C-ARM 42X120 X-RAY (DRAPES) ×3 IMPLANT
DRAPE CAMERA CLOSED 9X96 (DRAPES) IMPLANT
DRAPE LG THREE QUARTER DISP (DRAPES) ×3 IMPLANT
DRAPE LINGEMAN PERC (DRAPES) ×3 IMPLANT
DRAPE SURG IRRIG POUCH 19X23 (DRAPES) ×3 IMPLANT
DRSG TEGADERM 4X4.75 (GAUZE/BANDAGES/DRESSINGS) ×3 IMPLANT
DRSG TEGADERM 8X12 (GAUZE/BANDAGES/DRESSINGS) IMPLANT
FIBER LASER TRAC TIP (UROLOGICAL SUPPLIES) ×3 IMPLANT
GLOVE BIOGEL M STRL SZ7.5 (GLOVE) ×9 IMPLANT
GOWN STRL REIN XL XLG (GOWN DISPOSABLE) ×6 IMPLANT
GUIDEWIRE AMPLAZ .035X145 (WIRE) ×3 IMPLANT
GUIDEWIRE ANG ZIPWIRE 038X150 (WIRE) ×3 IMPLANT
GUIDEWIRE STR DUAL SENSOR (WIRE) ×3 IMPLANT
KIT BASIN OR (CUSTOM PROCEDURE TRAY) ×3 IMPLANT
LASER FIBER DISP (UROLOGICAL SUPPLIES) IMPLANT
LASER FIBER DISP 1000U (UROLOGICAL SUPPLIES) IMPLANT
MANIFOLD NEPTUNE II (INSTRUMENTS) ×3 IMPLANT
NEEDLE TROCAR 18X15 ECHO (NEEDLE) IMPLANT
NEEDLE TROCAR 18X20 (NEEDLE) IMPLANT
NS IRRIG 1000ML POUR BTL (IV SOLUTION) IMPLANT
PACK BASIC VI WITH GOWN DISP (CUSTOM PROCEDURE TRAY) IMPLANT
PACK CYSTO (CUSTOM PROCEDURE TRAY) ×3 IMPLANT
PAD ABD 7.5X8 STRL (GAUZE/BANDAGES/DRESSINGS) IMPLANT
PROBE LITHOCLAST ULTRA 3.8X403 (UROLOGICAL SUPPLIES) IMPLANT
PROBE PNEUMATIC 1.0MMX570MM (UROLOGICAL SUPPLIES) ×3 IMPLANT
SET IRRIG Y TYPE TUR BLADDER L (SET/KITS/TRAYS/PACK) IMPLANT
SET WARMING FLUID IRRIGATION (MISCELLANEOUS) IMPLANT
SHEATH ACCESS URETERAL 38CM (SHEATH) ×3 IMPLANT
SPONGE GAUZE 4X4 12PLY (GAUZE/BANDAGES/DRESSINGS) ×3 IMPLANT
SPONGE LAP 4X18 X RAY DECT (DISPOSABLE) ×3 IMPLANT
STENT CONTOUR 6FRX26X.038 (STENTS) ×6 IMPLANT
STONE CATCHER W/TUBE ADAPTER (UROLOGICAL SUPPLIES) ×3 IMPLANT
SURGIFLO W/THROMBIN 8M KIT (HEMOSTASIS) ×3 IMPLANT
SUT SILK 2 0 30  PSL (SUTURE) ×1
SUT SILK 2 0 30 PSL (SUTURE) ×2 IMPLANT
SUT VIC AB 3-0 PS2 18 (SUTURE) ×1
SUT VIC AB 3-0 PS2 18XBRD (SUTURE) ×2 IMPLANT
SYR 20CC LL (SYRINGE) ×3 IMPLANT
SYRINGE 10CC LL (SYRINGE) ×3 IMPLANT
TOWEL OR 17X26 10 PK STRL BLUE (TOWEL DISPOSABLE) ×3 IMPLANT
TUBE FEEDING 8FR 16IN STR KANG (MISCELLANEOUS) ×3 IMPLANT
TUBING CONNECTING 10 (TUBING) ×3 IMPLANT
WATER STERILE IRR 1500ML POUR (IV SOLUTION) IMPLANT

## 2013-04-21 NOTE — Op Note (Signed)
NAMESILVINO, SELMAN NO.:  [id number]  MEDICAL RECORD NO.:  [id number]  LOCATION:  1419                         FACILITY:  [facility]  PHYSICIAN:  Sebastian Ache, MD     DATE OF BIRTH:  24-Jan-1946  DATE OF PROCEDURE: 04/21/2013 DATE OF DISCHARGE:                              OPERATIVE REPORT   DIAGNOSES:  Right greater than left large volume renal stones.  PROCEDURE: 1. Right 2nd stage percutaneous nephrostolithotomy with laser     lithotripsy. 2. Right diagnostic ureteroscopy. 3. Right antegrade nephrostogram interpretation. 4. Exchange of right ureteral stent, 6 x 26, no tether. 5. Exchange of left ureteral stent, 6 x 26, no tether. 6. Left retrograde pyelogram interpretation. 7. Left ureteroscopy with basketing of ureteral stone.  ESTIMATED BLOOD LOSS:  Nil.  COMPLICATIONS:  None.  SPECIMEN:  Right and left renal stone for compositional analysis.  FINDINGS: 1. Residual right renal stone, largest foci in lower pole calyx,     approximately 1.5 cm necessitating additional holmium laser     lithotripsy. 2. Unremarkable right ureter. 3. Moderate volume left lower pole renal stones. 4. Unremarkable urinary bladder.  DRAINS:  Foley catheter straight drain.  INDICATION:  Mr. Barriere is a very pleasant 67 year old gentleman, who was found to have a right partial staghorn kidney stone as well as left lower pole kidney stones with an element of worsening chronic renal insufficiency.  He underwent right first stage percutaneous nephrostolithotomy on April 19, 2013, at which point, majority of his right-sided stone was removed.  He also underwent left ureteral stenting to allow for passive dilation for addressing his left-sided stone for plan second-stage procedure which he presents for today.  He has had no interval fevers, and has done well in the interval.  Informed consent was obtained and placed in the medical record.  PROCEDURE IN DETAIL:  The patient  being Alex Bowman was verified and the procedure being right second stage percutaneous nephrostolithotomy and left ureteroscopic stone manipulation was confirmed.  Procedure was carried out.  Time-out was performed.  Intravenous antibiotics were administered.  General endotracheal anesthesia was introduced.  The patient was placed into a right side up modified lithotomy position, placing a large chest roll under his right flank, bringing his right arm across his abdomen providing adequate padding of his shoulders and this providing access to both his right nephrostomy tract as well his perineum.  He was further fashioned on the operative table using 3-inch tape over a foam padding.  The previous percutaneous drape was removed and the previous right nephroureteral stent and nephrostomy tubes were carefully prepped using chlorhexidine gluconate into a new operative field on the right side.  His perineum was prepped and draped with a separate operative site using iodine and a new Foley catheter was placed via straight drain a 16-French.  Initial attention was directed at right antegrade nephrostogram.  Injection of iodinated contrast via the nephrostomy tube revealed excellent placement of the tube and no excessive contrast extravasation and excellent patency of the ureter. Nephrostomy tube was then carefully removed under fluoroscopic vision. Next, flexible nephroscopy was performed using a 16-French flexible cystoscope.  Inspection of all calices  was performed.  There was small volume right upper pole stones, which were amenable to simple basketing and these removed in their entirety.  There was a calcification seen on fluoroscopy that appeared to be in a very acute angled lower pole calyx and this was indeed discovered and this was rather a large in size approximately 1.5 cm.  As such, a was carefully repositioned using basketing to the level of the renal pelvis and, at which point,  holmium laser energy was applied using a 200 nanometer fiber at settings of 0.5 joules and 20 Hz fragmenting this large stone approximately 4 smaller pieces.  These were then grasped sequentially with an escape basket and brought out in their entirety.  Repeat flexible nephroscopy revealed complete resolution of all stones greater than 1/3rd mm.  The UPJ area was inspected and found to be intact and injured.  A 0.038 Glidewire was then advanced down the level of the ureter and the previous nephroureteral stent was exchanged for a Sensor wire.  Next, a 6-French flexible ureteroscope was advanced over the sensor working wire to the level of the urinary bladder in antegrade fashion, and flexible ureteroscopy was performed in the entire length of the right ureter. This revealed no mucosal abnormalities.  No evidence of perforation and no residual stone fragments in the right ureter.  At this point, leaving a safety wire in place.  Attention was redirected at the left side. Previous Foley catheter was then removed and using rigid cystoscopy the distal end of the left ureteral stent was brought to level of urethral meatus through which a 0.038 Glidewire was advanced at the level of left upper pole.  The stent was exchanged for a 6-French end-hole catheter and left retrograde pyelogram seen.  Left retrograde pyelogram demonstrated a single left ureter, single system left kidney.  No filling defects or narrowing noted.  They the Glidewire was once again advanced as a safety wire.  Next, semi-rigid ureteroscopy was performed of the left distal 1/3rd of the ureter alongside a separate Sensor working wire and 8-French feeding tube in urinary bladder for pressure release.  This revealed no mucosal abnormalities of the distal ureter.  Next, the 12/14, 38-cm ureteral access sheath was carefully placed over the sensor working wire to the level of the mid ureter and flexible digital ureteroscopy was  performed near the remainder of the left ureter and entire left kidney.  This revealed only a single foci of left lower pole stones, which were found to be a conglomerate of small approximately 2-3 mm stones, but in total approximate volume of 1 cm.  These were minimal to simple basketing. They were removed sequentially using escape basket, and set aside for compositional analysis.  Repeat systematic inspection of left kidney revealed complete resolution of all fragments larger than 1/3rd mm.  The access sheath was removed under continuous ureteroscopic vision and no mucosal abnormalities were found.  Finally, a new 6 x 26 double-J stent was placed over the remaining safety wire using cystoscopic and fluoroscopic guidance.  Good proximal and distal curl were noted.  Many Foley catheter was placed per urethra straight drained.  Attention was then directed to placement of right ureteral stent using flexible nephroscopy once again, a 6 x 26 double-J stent was advanced in antegrade fashion over the remaining safety wire and deployed good proximal and distal curl were noted.  The nephrostomy tract was then sealed using 5 mL of FloSeal.  Closed the skin using interrupted 0 Vicryl, followed  by dry dressing.  Procedure was then terminated.  The patient tolerated the procedure well.  There were no immediate periprocedural complications.  The patient was taken to postanesthesia care unit in stable condition.          ______________________________ Sebastian Ache, MD     TM/MEDQ  D:  04/21/2013  T:  04/21/2013  Job:  [id number]

## 2013-04-21 NOTE — Transfer of Care (Signed)
Immediate Anesthesia Transfer of Care Note  Patient: Alex Bowman  Procedure(s) Performed: Procedure(s) with comments: NEPHROLITHOTOMY PERCUTANEOUS RIGHT 2ND STAGE PERCUTANEOUS NEPHROLITHOTOMY (Right) CYSTOSCOPY WITH URETEROSCOPY bilateral retrograde, bilateral stent change, stone extraction (Left) - bilateral ureters HOLMIUM LASER APPLICATION (Right)  Patient Location: PACU  Anesthesia Type:General  Level of Consciousness: awake, alert , oriented and patient cooperative  Airway & Oxygen Therapy: Patient Spontanous Breathing and Patient connected to face mask oxygen  Post-op Assessment: Report given to PACU RN and Post -op Vital signs reviewed and stable  Post vital signs: stable  Complications: No apparent anesthesia complications

## 2013-04-21 NOTE — Progress Notes (Signed)
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 Had 1 prior episode colic with small stone that passed with mediical therapy. Now s/p Rt first stage percutanous nephrostolithtoomy with left ureteral stent placement 9/8 with plan to perform second stage right PCNL and left sided uretroscopic stone manipulation today.   2 - Renal Insuficiency - Cr 1.65 at ER 02/2013. Mild rt lower pole hydro from stone as per above. Cr 04/19/13 1.8 range.  Today Alex Bowman is without complaints. No tube problems or fevers. He wants to proceed with second stage surgery today.  Objective: Vital signs in last 24 hours: Temp:  [97.4 F (36.3 C)-98.2 F (36.8 C)] 97.4 F (36.3 C) (09/10 0132) Pulse Rate:  [61-70] 61 (09/10 0132) Resp:  [16-18] 16 (09/10 0132) BP: (90-151)/(40-90) 135/53 mmHg (09/10 0132) SpO2:  [94 %-96 %] 94 % (09/10 0132) Last BM Date: 04/18/13  Intake/Output from previous day: 09/09 0701 - 09/10 0700 In: 1440 [P.O.:240; I.V.:1200] Out: 2500 [Urine:2500] Intake/Output this shift: Total I/O In: 1440 [P.O.:240; I.V.:1200] Out: 1800 [Urine:1800]  General appearance: alert, cooperative, appears stated age and morbidly obese Head: Normocephalic, without obvious abnormality, atraumatic Eyes: conjunctivae/corneas clear. PERRL, EOM's intact. Fundi benign. Ears: normal TM's and external ear canals both ears Nose: Nares normal. Septum midline. Mucosa normal. No drainage or sinus tenderness. Throat: lips, mucosa, and tongue normal; teeth and gums normal Neck: no adenopathy, no carotid bruit, no JVD, supple, symmetrical, trachea midline and thyroid not enlarged, symmetric, no tenderness/mass/nodules Back: symmetric, no curvature. ROM normal. No CVA tenderness. Resp: clear to auscultation bilaterally Chest wall: no  tenderness Cardio: regular rate and rhythm, S1, S2 normal, no murmur, click, rub or gallop GI: soft, non-tender; bowel sounds normal; no masses,  no organomegaly Male genitalia: normal Extremities: extremities normal, atraumatic, no cyanosis or edema Pulses: 2+ and symmetric Skin: Skin color, texture, turgor normal. No rashes or lesions Lymph nodes: Cervical, supraclavicular, and axillary nodes normal. Neurologic: Grossly normal Incision/Wound: Rt neph tube c/d/i with pink urine. Foley c/d/i with pink urine.  Lab Results:   Recent Labs  04/19/13 1523 04/20/13 0428  WBC 5.5  --   HGB 14.1 12.7*  HCT 41.3 38.0*  PLT 180  --    BMET  Recent Labs  04/19/13 1523  NA 137  K 4.2  CL 99  CO2 26  GLUCOSE 111*  BUN 21  CREATININE 1.84*  CALCIUM 9.4   PT/INR No results found for this basename: LABPROT, INR,  in the last 72 hours ABG No results found for this basename: PHART, PCO2, PO2, HCO3,  in the last 72 hours  Studies/Results: Dg Abd 1 View  04/19/2013   *RADIOLOGY REPORT*  Clinical Data: Renal calculus  DG C-ARM 1-60 MIN - NRPT [facility],ABDOMEN - 1 VIEW  Comparison: None.  Findings: Images are not labeled as to right or left.  This images demonstrate intraoperative prepped and venous lithotomy.  The left ureteral stent is coiled over the bladder.  An Amplatz wire extends into the bladder from the right ureter.  The final image demonstrates a nephrostomy catheter projecting over the right renal pelvis.  IMPRESSION: Intraoperative percutaneous nephrolithotomy of what is presumed to be the right kidney.   Original Report Authenticated By: Jolaine Click, M.D.   Dg Retrograde Pyelogram  04/19/2013   *RADIOLOGY REPORT*  Clinical  data:   Right renal calculus and left stent placement  INTRAOPERATIVE BILATERAL RETROGRADE UROGRAPHY  Comparison:  02/15/2013  Technique:  Images were obtained with the C-arm fluoroscopic device intraoperatively and submitted for interpretation post-operatively.  Please see the procedural report for the amount of contrast and the fluoroscopy time utilized.  Findings:  Imaging demonstrates cannulation of both ureteral orifices and contrast filling the ureters and renal collecting systems.  A left double-J ureteral stent has been placed.  The filling defect in the right renal pelvis compatible with a calculus is noted.  IMPRESSION: Left ureteral stent placement.  Right renal calculus.   Original Report Authenticated By: Jolaine Click, M.D.   Dg C-arm 1-60 Min-no Report  04/19/2013   *RADIOLOGY REPORT*  Clinical Data: Renal calculus  DG C-ARM 1-60 MIN - NRPT [facility],ABDOMEN - 1 VIEW  Comparison: None.  Findings: Images are not labeled as to right or left.  This images demonstrate intraoperative prepped and venous lithotomy.  The left ureteral stent is coiled over the bladder.  An Amplatz wire extends into the bladder from the right ureter.  The final image demonstrates a nephrostomy catheter projecting over the right renal pelvis.  IMPRESSION: Intraoperative percutaneous nephrolithotomy of what is presumed to be the right kidney.   Original Report Authenticated By: Jolaine Click, M.D.    Anti-infectives: Anti-infectives   Start     Dose/Rate Route Frequency Ordered Stop   04/21/13 0000  piperacillin-tazobactam (ZOSYN) IVPB 3.375 g     3.375 g 100 mL/hr over 30 Minutes Intravenous 30 min pre-op 04/20/13 1205     04/19/13 0915  piperacillin-tazobactam (ZOSYN) IVPB 3.375 g     3.375 g 100 mL/hr over 30 Minutes Intravenous  Once 04/19/13 0850 04/19/13 1230      Assessment/Plan:  1 - Rt > Lt Large Volume Nephrolithiasis - Doing well s/p first stage surgery, plan for second stage today as scheduled.   2 - Renal Insuficiency - electrolytes and UOP acceptable, continue to monitor.   [facility], [place] 04/21/2013

## 2013-04-21 NOTE — Anesthesia Postprocedure Evaluation (Signed)
  Anesthesia Post-op Note  Patient: Alex Bowman  Procedure(s) Performed: Procedure(s) (LRB): NEPHROLITHOTOMY PERCUTANEOUS RIGHT 2ND STAGE PERCUTANEOUS NEPHROLITHOTOMY (Right) CYSTOSCOPY WITH URETEROSCOPY bilateral retrograde, bilateral stent change, stone extraction (Left) HOLMIUM LASER APPLICATION (Right)  Patient Location: PACU  Anesthesia Type: General  Level of Consciousness: awake and alert   Airway and Oxygen Therapy: Patient Spontanous Breathing  Post-op Pain: mild  Post-op Assessment: Post-op Vital signs reviewed, Patient's Cardiovascular Status Stable, Respiratory Function Stable, Patent Airway and No signs of Nausea or vomiting  Last Vitals:  Filed Vitals:   04/21/13 1643  BP: 141/64  Pulse: 78  Temp: 36.4 C  Resp: 20    Post-op Vital Signs: stable   Complications: No apparent anesthesia complications

## 2013-04-21 NOTE — Brief Op Note (Signed)
04/19/2013 - 04/21/2013  11:19 AM  PATIENT:  Alex Bowman  67 y.o. male  PRE-OPERATIVE DIAGNOSIS:  RIGHT STAGHORN AND LEFT RENAL STONES  POST-OPERATIVE DIAGNOSIS:  RIGHT STAGHORN AND LEFT RENAL STONES  PROCEDURE:  Procedure(s) with comments: NEPHROLITHOTOMY PERCUTANEOUS RIGHT 2ND STAGE PERCUTANEOUS NEPHROLITHOTOMY (Right) CYSTOSCOPY WITH URETEROSCOPY bilateral retrograde, bilateral stent change, stone extraction (Left) - bilateral ureters HOLMIUM LASER APPLICATION (Right)  SURGEON:  Surgeon(s) and Role:    * Sebastian Ache, MD - Primary  PHYSICIAN ASSISTANT:   ASSISTANTS: none   ANESTHESIA:   general  EBL:  Total I/O In: 1000 [I.V.:1000] Out: -   BLOOD ADMINISTERED:none  DRAINS: 4F foley to straight drain   LOCAL MEDICATIONS USED:  NONE  SPECIMEN:  Source of Specimen:  Rt and Lt renal stones for compositional analysis  DISPOSITION OF SPECIMEN:  [facility]  COUNTS:  YES  TOURNIQUET:  * No tourniquets in log *  DICTATION: .Other Dictation: Dictation Number  [id number]  PLAN OF CARE: Admit to inpatient   PATIENT DISPOSITION:  PACU - hemodynamically stable.   Delay start of Pharmacological VTE agent (>24hrs) due to surgical blood loss or risk of bleeding: not applicable

## 2013-04-22 ENCOUNTER — Encounter (HOSPITAL_COMMUNITY): Payer: Self-pay | Admitting: Urology

## 2013-04-22 LAB — BASIC METABOLIC PANEL
BUN: 25 mg/dL — ABNORMAL HIGH (ref 6–23)
CO2: 25 mEq/L (ref 19–32)
Calcium: 8.9 mg/dL (ref 8.4–10.5)
Chloride: 99 mEq/L (ref 96–112)
Creatinine, Ser: 1.97 mg/dL — ABNORMAL HIGH (ref 0.50–1.35)
Glucose, Bld: 161 mg/dL — ABNORMAL HIGH (ref 70–99)

## 2013-04-22 LAB — GLUCOSE, CAPILLARY
Glucose-Capillary: 147 mg/dL — ABNORMAL HIGH (ref 70–99)
Glucose-Capillary: 112 mg/dL — ABNORMAL HIGH (ref 70–99)
Glucose-Capillary: 108 mg/dL — ABNORMAL HIGH (ref 70–99)

## 2013-04-22 LAB — CBC
HCT: 35.5 % — ABNORMAL LOW (ref 39.0–52.0)
Hemoglobin: 11.8 g/dL — ABNORMAL LOW (ref 13.0–17.0)
MCH: 31.8 pg (ref 26.0–34.0)
MCV: 95.7 fL (ref 78.0–100.0)
RBC: 3.71 MIL/uL — ABNORMAL LOW (ref 4.22–5.81)
WBC: 9.4 10*3/uL (ref 4.0–10.5)

## 2013-04-22 MED ORDER — FESOTERODINE FUMARATE ER 4 MG PO TB24
4.0000 mg | ORAL_TABLET | Freq: Every day | ORAL | Status: DC
  Administered 2013-04-22 – 2013-04-26 (×5): 4 mg via ORAL
  Filled 2013-04-22 (×7): qty 1

## 2013-04-22 MED ORDER — SODIUM CHLORIDE 0.9 % IV SOLN
INTRAVENOUS | Status: DC
  Administered 2013-04-22 (×2): via INTRAVENOUS
  Administered 2013-04-23: 08:00:00 75 mL/h via INTRAVENOUS
  Administered 2013-04-23: 20:00:00 via INTRAVENOUS
  Administered 2013-04-23: 16:00:00 125 mL/h via INTRAVENOUS
  Administered 2013-04-24 – 2013-04-26 (×5): via INTRAVENOUS

## 2013-04-22 MED ORDER — TAMSULOSIN HCL 0.4 MG PO CAPS
0.4000 mg | ORAL_CAPSULE | Freq: Every day | ORAL | Status: DC
  Administered 2013-04-22 – 2013-04-26 (×5): 0.4 mg via ORAL
  Filled 2013-04-22 (×5): qty 1

## 2013-04-22 NOTE — Progress Notes (Signed)
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 Had 1 prior episode colic with small stone that passed with mediical therapy. Now s/p Rt first stage percutanous nephrostolithtoomy with left ureteral stent placement 9/8 and second stage Rt PCNL with left ureteroscopy and bilateral stents 9/10 to goal of clinically stone free.   2 - Renal Insuficiency - Cr 1.65 at ER 02/2013. Mild rt lower pole hydro from stone as per above. Uptrending somewhat post-op now to 1.97 and K 5.3. On BID lisinopril.  Today Truitt doing well. Voiding now with foley out. Some rt flank soreness as expected. Some urinary frequecny / urgency after DC foley this AM.  Objective: Vital signs in last 24 hours: Temp:  [97.2 F (36.2 C)-98 F (36.7 C)] 97.4 F (36.3 C) (09/11 0520) Pulse Rate:  [58-98] 63 (09/11 0520) Resp:  [13-20] 18 (09/11 0520) BP: (100-147)/(40-79) 117/47 mmHg (09/11 0520) SpO2:  [94 %-100 %] 96 % (09/11 0520) Last BM Date: 04/18/13  Intake/Output from previous day: 09/10 0701 - 09/11 0700 In: 5520 [P.O.:1920; I.V.:3300; IV Piggyback:300] Out: 1900 [Urine:1750; Blood:150] Intake/Output this shift:    General appearance: alert, cooperative, appears stated age and morbidly obese Head: Normocephalic, without obvious abnormality, atraumatic Eyes: conjunctivae/corneas clear. PERRL, EOM's intact. Fundi benign. Ears: normal TM's and external ear canals both ears Nose: Nares normal. Septum midline. Mucosa normal. No drainage or sinus tenderness. Throat: lips, mucosa, and tongue normal; teeth and gums normal Neck: no adenopathy, no carotid bruit, no JVD, supple, symmetrical, trachea midline and thyroid not enlarged, symmetric, no tenderness/mass/nodules Back: symmetric, no curvature. ROM normal. No CVA tenderness. Chest wall: no  tenderness Cardio: regular rate and rhythm, S1, S2 normal, no murmur, click, rub or gallop GI: soft, non-tender; bowel sounds normal; no masses,  no organomegaly Male genitalia: normal Extremities: extremities normal, atraumatic, no cyanosis or edema Pulses: 2+ and symmetric Skin: Skin color, texture, turgor normal. No rashes or lesions Lymph nodes: Cervical, supraclavicular, and axillary nodes normal. Neurologic: Grossly normal Incision/Wound: Rt flank area with c/d/i dressing and mild TTP. Some light pink urien in bedside urinal w/o clots.  Lab Results:   Recent Labs  04/19/13 1523 04/20/13 0428 04/22/13 0430  WBC 5.5  --  9.4  HGB 14.1 12.7* 11.8*  HCT 41.3 38.0* 35.5*  PLT 180  --  152   BMET  Recent Labs  04/19/13 1523 04/22/13 0430  NA 137 131*  K 4.2 5.3*  CL 99 99  CO2 26 25  GLUCOSE 111* 161*  BUN 21 25*  CREATININE 1.84* 1.97*  CALCIUM 9.4 8.9   PT/INR No results found for this basename: LABPROT, INR,  in the last 72 hours ABG No results found for this basename: PHART, PCO2, PO2, HCO3,  in the last 72 hours  Studies/Results: Dg Abd 1 View  04/21/2013   *RADIOLOGY REPORT*  Clinical Data: Renal calculi  ABDOMEN - 1 VIEW  Comparison: None.  Findings: On the initial image, contrast was injected into the right nephrostomy opacifying the right renal collecting system.  On the subsequent images, bilateral double J ureteral stents are noted.  The proximal right ureteral stent is not clearly identified.  Contrast is present in the left renal collecting system.  IMPRESSION: Intraoperative imaging for a urological procedure.   Original Report Authenticated By: Jolaine Click, M.D.  Dg C-arm 61-120 Min-no Report  04/21/2013   CLINICAL DATA: kidney stone   C-ARM 61-120 MINUTES  Fluoroscopy was utilized by the requesting physician.  No radiographic  interpretation.     Anti-infectives: Anti-infectives   Start     Dose/Rate Route Frequency Ordered Stop   04/21/13 0000   piperacillin-tazobactam (ZOSYN) IVPB 3.375 g     3.375 g 100 mL/hr over 30 Minutes Intravenous 30 min pre-op 04/20/13 1205 04/21/13 0833   04/19/13 0915  piperacillin-tazobactam (ZOSYN) IVPB 3.375 g     3.375 g 100 mL/hr over 30 Minutes Intravenous  Once 04/19/13 0850 04/19/13 1230      Assessment/Plan:  1 - Rt > Lt Large Volume Nephrolithiasis - Now cliniclaly stone free after bilateral multistage approach, indwelling JJ stents to remain and be removed at office f/u.  2 - Renal Insuficiency -GFR unfortunately worsened some, though not severly over baseline. Likely due to peri-op meds + ACEI + purposeful fluid limitation + percutanous approach renal surgery. Stop K in IVF and increase to NS at 75, Hold ACEI, and recheck BMP in AM tomorrow.    3 - Dispo - Remain in house for now. Begin tamsulosin + toviaz for bladder spasms. If electrolytes improved and GFR stable / improved tomorrow will DC then.   [facility], Daejah Klebba 04/22/2013

## 2013-04-23 ENCOUNTER — Inpatient Hospital Stay (HOSPITAL_COMMUNITY): Payer: Medicare Other

## 2013-04-23 LAB — GLUCOSE, CAPILLARY
Glucose-Capillary: 88 mg/dL (ref 70–99)
Glucose-Capillary: 93 mg/dL (ref 70–99)
Glucose-Capillary: 97 mg/dL (ref 70–99)

## 2013-04-23 LAB — URINALYSIS, ROUTINE W REFLEX MICROSCOPIC
Bilirubin Urine: NEGATIVE
Ketones, ur: NEGATIVE mg/dL
Nitrite: NEGATIVE
pH: 5.5 (ref 5.0–8.0)

## 2013-04-23 LAB — URINE MICROSCOPIC-ADD ON

## 2013-04-23 LAB — BASIC METABOLIC PANEL
Calcium: 9.2 mg/dL (ref 8.4–10.5)
GFR calc Af Amer: 26 mL/min — ABNORMAL LOW (ref >90)
GFR calc non Af Amer: 22 mL/min — ABNORMAL LOW (ref >90)
Glucose, Bld: 101 mg/dL — ABNORMAL HIGH (ref 70–99)
Sodium: 133 mEq/L — ABNORMAL LOW (ref 135–145)

## 2013-04-23 MED ORDER — SODIUM CHLORIDE 0.9 % IV BOLUS (SEPSIS)
1000.0000 mL | Freq: Once | INTRAVENOUS | Status: AC
  Administered 2013-04-23: 1000 mL via INTRAVENOUS

## 2013-04-23 NOTE — Consult Note (Signed)
Renal Service Consult Note [facility]  Alex Bowman 04/23/2013 Delano Metz D Requesting Physician:  Dr Berneice Heinrich  Reason for Consult:  Acute on CRF after stone procedure HPI: The patient is a 67 y.o. year-old with DM2, MO, OA and CKD with baseline creat 1.6 in July 2014.  Patient was admitted 9/8 for R>L large volume nephrolithiasis. On 9/9 urology did bilat cysto and insertion of bilat internal stents, R perc nephrolithotomy with [place] fragmentation of dominant renal pelvis stone into smaller fragments and removal of the fragements.   CT scan today showed bilat internal ureteral stents in appropriate position, no hydro.  No fluid collections. UA not done yet. A nephrostomy tube was left in placed.  Went back to OR on 9/10 for further stone removal on the R and also had ureteral stone removal on the L.  Post op the creatinine has been rising up to 2.74 today, it was 1.8 on admission and 1.56 a week ago.    Patient is up in chair, has pain R flank with moving, no n/v/d, no abd pain, no sob or cough  ROS  no other complaints  Past Medical History  Past Medical History  Diagnosis Date  . Hypertension   . Knee pain     R>L  . Morbid obesity with BMI of 45.0-49.9, adult     Attempted weight loss efforts at last visit had BMI down to 42, unfortunately back up to 46 with weight going up to 312 lb from 285 lb  . Dyslipidemia     Monitored by Primary Physician. On statin  . Bilateral lower extremity edema     Chronic, with venous insufficiency  . GERD (gastroesophageal reflux disease)   . Pneumonia 20 years ago    h/o  . Diabetes mellitus without complication     "on medicine, but numbers have been good for years since weight loss"  . Arthritis   . Complication of anesthesia     stayed awake during colonoscopy   Past Surgical History  Past Surgical History  Procedure Laterality Date  . Nm myoview ltd; persantine  May 2012    Suggested as high risk with 3 times a day 1.2  to, mild to moderate 3 times a day affecting basolateral and mid lateral walls.  . 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 Mild elevation in LVDP, EF 55%. --> False-positive stress test  . Hand surgery Bilateral 15 years ago  . Nephrolithotomy Right 04/19/2013    Procedure: NEPHROLITHOTOMY PERCUTANEOUS;  Surgeon: Sebastian Ache, MD;  Location: [facility];  Service: Urology;  Laterality: Right;  . Cystoscopy with stent placement Left 04/19/2013    Procedure: CYSTOSCOPY WITH STENT PLACEMENT;  Surgeon: Sebastian Ache, MD;  Location: [facility];  Service: Urology;  Laterality: Left;  RIGHT RETROGRADE PYELOGRAM  . Nephrolithotomy Right 04/21/2013    Procedure: NEPHROLITHOTOMY PERCUTANEOUS RIGHT 2ND STAGE PERCUTANEOUS NEPHROLITHOTOMY;  Surgeon: Sebastian Ache, MD;  Location: [facility];  Service: Urology;  Laterality: Right;  . Cystoscopy with ureteroscopy Left 04/21/2013    Procedure: CYSTOSCOPY WITH URETEROSCOPY bilateral retrograde, bilateral stent change, stone extraction;  Surgeon: Sebastian Ache, MD;  Location: [facility];  Service: Urology;  Laterality: Left;  bilateral ureters  . Holmium laser application Right 04/21/2013    Procedure: HOLMIUM LASER APPLICATION;  Surgeon: Sebastian Ache, MD;  Location: [facility];  Service: Urology;  Laterality: Right;   Family History History reviewed. No pertinent family history. Social  History  reports that he quit smoking about 16 years ago. His smoking use included Cigarettes. He has a 30 pack-year smoking history. He quit smokeless tobacco use about 10 years ago. His smokeless tobacco use included Chew. He reports that he does not drink alcohol or use illicit drugs. Allergies No Known Allergies Home medications Prior to Admission medications   Medication Sig Start Date End Date Taking? Authorizing Provider  aspirin EC 81 MG tablet Take 81 mg by mouth daily.   Yes Historical Provider, MD  atorvastatin (LIPITOR) 40 MG tablet Take 40 mg  by mouth every morning.   Yes Historical Provider, MD  furosemide (LASIX) 20 MG tablet Take 20 mg by mouth daily as needed.   Yes Historical Provider, MD  HYDROcodone-acetaminophen (NORCO) 10-325 MG per tablet Take 1 tablet by mouth at bedtime.   Yes Historical Provider, MD  lisinopril-hydrochlorothiazide (PRINZIDE,ZESTORETIC) 20-12.5 MG per tablet Take 1 tablet by mouth 2 (two) times daily.   Yes Historical Provider, MD  metFORMIN (GLUCOPHAGE) 500 MG tablet Take 500 mg by mouth 2 (two) times daily with a meal.   Yes Historical Provider, MD  Multiple Vitamin (MULTIVITAMIN WITH MINERALS) TABS tablet Take 1 tablet by mouth daily.   Yes Historical Provider, MD  nebivolol (BYSTOLIC) 5 MG tablet Take 5 mg by mouth every morning.    Yes Historical Provider, MD  sitaGLIPtin (JANUVIA) 50 MG tablet Take 50 mg by mouth every morning.   Yes Historical Provider, MD   Liver Function Tests No results found for this basename: AST, ALT, ALKPHOS, BILITOT, PROT, ALBUMIN,  in the last 168 hours No results found for this basename: LIPASE, AMYLASE,  in the last 168 hours CBC  Recent Labs Lab 04/19/13 1523 04/20/13 0428 04/22/13 0430  WBC 5.5  --  9.4  HGB 14.1 12.7* 11.8*  HCT 41.3 38.0* 35.5*  MCV 94.7  --  95.7  PLT 180  --  152   Basic Metabolic Panel  Recent Labs Lab 04/19/13 1523 04/22/13 0430 04/23/13 0600  NA 137 131* 133*  K 4.2 5.3* 4.8  CL 99 99 97  CO2 26 25 27   GLUCOSE 111* 161* 101*  BUN 21 25* 31*  CREATININE 1.84* 1.97* 2.74*  CALCIUM 9.4 8.9 9.2    Physical Exam  Blood pressure 95/47, pulse 110, temperature 98.2 F (36.8 C), temperature source Oral, resp. rate 16, height 5\' 9"  (1.753 m), weight 146.648 kg (323 lb 4.8 oz), SpO2 98.00%. Gen: adult male up in chair, morbidly obese, no distress, hurts to sit forward ( in back area) Skin: no rash, cyanosis HEENT:  EOMI, sclera anicteric, throat slightly dry Neck: flat neck veins, no JVD, no LAN Chest: clear bilat to the  bases CV: irregular, no M or rub or gallop, pedal pulses not palpable Abdomen: soft, marked obesity, nontender, no gross ascites Back: pouch on lower R back area, nothing much draining into pouch Ext: brawny skin changes bilat shins, minimal edema trace of lower legs bilt, no joint effusion, no gangrene/ulcers Neuro: alert, Ox3, nonfocal   Assessment: 1. Acute on CRF- occurring after two stone surgeries/procedures.  A CT scan noncontrast today showed no postop hydro.  UOP good, but BP's are low. Was on ACEI, held since yesterday.  Suspect he just needs more BP, will stop Bystolic and HCTZ, increase saline to 125/hr and give 1000cc bolus. He has room for volume.    2. S/P removal large renal stones, as above 3. CKD baseline creat 1.6 4.  Morbid obesity 5. HTN  Plan- Stop all BP meds, increase fluids, will follow, avoid dye and nsaids', etc  Vinson Moselle  MD Pager [phone]    Cell  [phone] 04/23/2013, 3:22 PM

## 2013-04-23 NOTE — Progress Notes (Signed)
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 Had 1 prior episode colic with small stone that passed with mediical therapy. Now s/p Rt first stage percutanous nephrostolithtoomy with left ureteral stent placement 9/8 and second stage Rt PCNL with left ureteroscopy and bilateral stents 9/10 to goal of clinically stone free. Foley removed 9/11 with resumed normal voiding. CT 9/12 with no significant residual stone and no evidence of urinoma / hematoma.   2 - Renal Insuficiency - Cr 1.65 at ER 02/2013. Mild rt lower pole hydro from stone as per above. Uptrending post-op now up to 2.74. He was on BID lisinopril and additional BP meds previously whic are now held. CT 9/12 with stents in good position bilaterally and no fluid collections. Nephrology consulted 9/12 and final recs pending.   Today Jevin if making progress. Still has expected rt flank soreness and some scant drainage.   Objective: Vital signs in last 24 hours: Temp:  [98 F (36.7 C)-98.2 F (36.8 C)] 98.2 F (36.8 C) (09/12 1414) Pulse Rate:  [67-110] 110 (09/12 1414) Resp:  [16-17] 16 (09/12 1414) BP: (95-134)/(47-48) 95/47 mmHg (09/12 1414) SpO2:  [98 %-99 %] 98 % (09/12 1414) Weight:  [146.648 kg (323 lb 4.8 oz)] 146.648 kg (323 lb 4.8 oz) (09/12 0600) Last BM Date: 04/18/13  Intake/Output from previous day: 09/11 0701 - 09/12 0700 In: 2825 [P.O.:1080; I.V.:1745] Out: 1430 [Urine:1430] Intake/Output this shift: Total I/O In: -  Out: 875 [Urine:875]  General appearance: alert, cooperative, appears stated age and mildly obese Head: Normocephalic, without obvious abnormality, atraumatic Eyes: conjunctivae/corneas clear. PERRL, EOM's intact. Fundi benign. Ears: normal TM's and external ear canals both ears Nose: Nares normal. Septum  midline. Mucosa normal. No drainage or sinus tenderness. Throat: lips, mucosa, and tongue normal; teeth and gums normal Neck: no adenopathy, no carotid bruit, no JVD, supple, symmetrical, trachea midline and thyroid not enlarged, symmetric, no tenderness/mass/nodules Back: symmetric, no curvature. ROM normal. No CVA tenderness. Resp: clear to auscultation bilaterally Chest wall: no tenderness Cardio: regular rate and rhythm, S1, S2 normal, no murmur, click, rub or gallop GI: soft, non-tender; bowel sounds normal; no masses,  no organomegaly Male genitalia: normal, burried penis as per baseline Extremities: extremities normal, atraumatic, no cyanosis or edema Pulses: 2+ and symmetric Skin: Skin color, texture, turgor normal. No rashes or lesions or acanthosis on LE bilaterally Lymph nodes: Cervical, supraclavicular, and axillary nodes normal. Neurologic: Grossly normal Incision/Wound: Rt flank are with minimal dampness at prior PCNL site. This was pouched this AM and remains w/o significant output.  Lab Results:   Recent Labs  04/22/13 0430  WBC 9.4  HGB 11.8*  HCT 35.5*  PLT 152   BMET  Recent Labs  04/22/13 0430 04/23/13 0600  NA 131* 133*  K 5.3* 4.8  CL 99 97  CO2 25 27  GLUCOSE 161* 101*  BUN 25* 31*  CREATININE 1.97* 2.74*  CALCIUM 8.9 9.2   PT/INR No results found for this basename: LABPROT, INR,  in the last 72 hours ABG No results found for this basename: PHART, PCO2, PO2, HCO3,  in the last 72 hours  Studies/Results: Ct Abdomen Pelvis Wo Contrast  04/23/2013   CLINICAL DATA:  Worsening right flank pain. Hematuria. Recently postop for stone removal and stent placement. [name]  EXAM: CT ABDOMEN AND PELVIS WITHOUT CONTRAST  TECHNIQUE: Multidetector CT imaging of the abdomen and pelvis was performed following the standard protocol without intravenous contrast.  COMPARISON:  02/15/2013  FINDINGS: Previously seen large calculus in the right renal collecting system is no  longer visualized and bilateral internal ureteral stents are now seen in appropriate position. There is no evidence of hydronephrosis. No evidence of perinephric hematoma or other fluid collections. Mild right perinephric stranding is seen, which is consistent with postsurgical change. No evidence of retroperitoneal hemorrhage elsewhere.  Noncontrast images of the liver, gallbladder, spleen, pancreas, and adrenal glands are unremarkable. No soft tissue masses or lymphadenopathy identified. No evidence of bowel obstruction. Mild colonic ileus noted. Small paraumbilical hernia containing only fat again demonstrated. Bibasilar atelectasis also noted in the posterior lung bases.  IMPRESSION: Bilateral internal ureteral stents in appropriate position. No evidence of hydronephrosis.  No evidence of perinephric hematoma, retroperitoneal hemorrhage, or other complication.  Mild postop colonic ileus and bibasilar atelectasis.   Electronically Signed   By: Myles Rosenthal   On: 04/23/2013 10:43    Anti-infectives: Anti-infectives   Start     Dose/Rate Route Frequency Ordered Stop   04/21/13 0000  piperacillin-tazobactam (ZOSYN) IVPB 3.375 g     3.375 g 100 mL/hr over 30 Minutes Intravenous 30 min pre-op 04/20/13 1205 04/21/13 0833   04/19/13 0915  piperacillin-tazobactam (ZOSYN) IVPB 3.375 g     3.375 g 100 mL/hr over 30 Minutes Intravenous  Once 04/19/13 0850 04/19/13 1230      Assessment/Plan:  1 - Rt > Lt Large Volume Nephrolithiasis - Now cliniclaly stone free after bilateral multistage approach, indwelling JJ stents to remain and be removed at office f/u.   2 - Renal Insuficiency -Appreciate nephrology eval and agree likely pre-renal + intrinsic renal dysfunction. Fortunately no large urinoma, hydro, or other surgical mechanical etiology by CT.  3 - Dispo - Remain in house for now until GFR trending better.  [facility], Chanese Hartsough 04/23/2013

## 2013-04-24 LAB — BASIC METABOLIC PANEL
Calcium: 8.6 mg/dL (ref 8.4–10.5)
Chloride: 103 mEq/L (ref 96–112)
Creatinine, Ser: 2.17 mg/dL — ABNORMAL HIGH (ref 0.50–1.35)
GFR calc Af Amer: 34 mL/min — ABNORMAL LOW (ref >90)
GFR calc non Af Amer: 30 mL/min — ABNORMAL LOW (ref >90)

## 2013-04-24 LAB — GLUCOSE, CAPILLARY: Glucose-Capillary: 104 mg/dL — ABNORMAL HIGH (ref 70–99)

## 2013-04-24 NOTE — Progress Notes (Signed)
Patient ID: Alex Bowman, male   DOB: [birth year], 67 y.o.   MRN: [id number]  Pt c/o some urgency. Voiding with a good flow and feels empty. No gross hematuria.   PE: SItting in chair, watching TV.  Abd soft, nT Ext - no CCE  CBC    Component Value Date/Time   WBC 9.4 04/22/2013 0430   RBC 3.71* 04/22/2013 0430   HGB 11.8* 04/22/2013 0430   HCT 35.5* 04/22/2013 0430   PLT 152 04/22/2013 0430   MCV 95.7 04/22/2013 0430   MCH 31.8 04/22/2013 0430   MCHC 33.2 04/22/2013 0430   RDW 12.7 04/22/2013 0430    BMET    Component Value Date/Time   NA 136 04/24/2013 0834   K 4.6 04/24/2013 0834   CL 103 04/24/2013 0834   CO2 26 04/24/2013 0834   GLUCOSE 117* 04/24/2013 0834   BUN 31* 04/24/2013 0834   CREATININE 2.17* 04/24/2013 0834   CALCIUM 8.6 04/24/2013 0834   GFRNONAA 30* 04/24/2013 0834   GFRAA 34* 04/24/2013 0834     Imp/plan  - s/p rt PCNL, left URS - internal stents -  -Cr improved today -appreciate nephrology input- check Cr in AM.

## 2013-04-24 NOTE — Progress Notes (Signed)
Renal Service Daily Progress Note [facility]  Subjective: No complaints, creat down to 2.1 today   Physical Exam:  Blood pressure 118/50, pulse 64, temperature 98.5 F (36.9 C), temperature source Oral, resp. rate 18, height 5\' 9"  (1.753 m), weight 146.648 kg (323 lb 4.8 oz), SpO2 97.00%. Gen: adult male, no distress, up in chair morbid obesity Neck: flat neck veins, no JVD, no LAN  Chest: clear bilat to the bases  CV: irregular, no M or rub or gallop, pedal pulses not palpable  Abdomen: soft, marked obesity, nontender, no gross ascites  Back: fluid pouch on lower R back area  Ext: brawny skin changes bilat shins, minimal pretibial edema bilat Neuro: alert, Ox3, nonfocal   Assessment:  1. Acute on CRF- due to intravasc vol depletion, low BP's and ACEI; resolving. Continue off of BP meds and diuretics, will decrease IVF to 50/hr, check creat in am 2. S/P surgical removal large renal stones, as above 3. CKD baseline creat 1.6 4. Morbid obesity 5. HTN- meds on hold  Alex Moselle  MD Pager [phone]    Cell  [phone] 04/24/2013, 10:26 AM    Recent Labs Lab 04/22/13 0430 04/23/13 0600 04/24/13 0834  NA 131* 133* 136  K 5.3* 4.8 4.6  CL 99 97 103  CO2 25 27 26   GLUCOSE 161* 101* 117*  BUN 25* 31* 31*  CREATININE 1.97* 2.74* 2.17*  CALCIUM 8.9 9.2 8.6   No results found for this basename: AST, ALT, ALKPHOS, BILITOT, PROT, ALBUMIN,  in the last 168 hours  Recent Labs Lab 04/19/13 1523 04/20/13 0428 04/22/13 0430  WBC 5.5  --  9.4  HGB 14.1 12.7* 11.8*  HCT 41.3 38.0* 35.5*  MCV 94.7  --  95.7  PLT 180  --  152   . atorvastatin  40 mg Oral q morning - 10a  . docusate sodium  100 mg Oral BID  . fesoterodine  4 mg Oral Daily  . insulin aspart  0-20 Units Subcutaneous TID WC  . insulin aspart  4 Units Subcutaneous TID WC  . senna  1 tablet Oral BID  . tamsulosin  0.4 mg Oral Daily   . sodium chloride 125 mL/hr at 04/24/13 0343   fentaNYL,  HYDROmorphone (DILAUDID) injection, menthol-cetylpyridinium, ondansetron, oxyCODONE, promethazine

## 2013-04-24 NOTE — Progress Notes (Signed)
No drainage noted in pouch to rt flank today

## 2013-04-25 LAB — BASIC METABOLIC PANEL
BUN: 30 mg/dL — ABNORMAL HIGH (ref 6–23)
Calcium: 8.9 mg/dL (ref 8.4–10.5)
Creatinine, Ser: 2.07 mg/dL — ABNORMAL HIGH (ref 0.50–1.35)
GFR calc Af Amer: 36 mL/min — ABNORMAL LOW (ref >90)
GFR calc non Af Amer: 31 mL/min — ABNORMAL LOW (ref >90)
Potassium: 4.3 mEq/L (ref 3.5–5.1)

## 2013-04-25 NOTE — Progress Notes (Signed)
Patient ID: Alex Bowman, male   DOB: June 08, 1946, 67 y.o.   MRN: [id number]  Pt without complaints. Urine clear.   Napping, but easily arousable.  Abd - soft NT  A/P - s/p rt PCNL, left URS - internal stents -  Home when kidney fxn stable

## 2013-04-25 NOTE — Progress Notes (Signed)
Renal Service Daily Progress Note [facility]  Subjective: No complaints, creat down to 2.1 today   Physical Exam:  Blood pressure 132/56, pulse 64, temperature 98.3 F (36.8 C), temperature source Oral, resp. rate 18, height 5\' 9"  (1.753 m), weight 146.648 kg (323 lb 4.8 oz), SpO2 100.00%. Gen: adult male, no distress, up in chair morbid obesity Neck: flat neck veins, no JVD, no LAN  Chest: clear bilat to the bases  CV: irregular, no M or rub or gallop, pedal pulses not palpable  Abdomen: soft, marked obesity, nontender, no gross ascites  Back: fluid pouch on lower R back area  Ext: brawny skin changes bilat shins, minimal pretibial edema bilat Neuro: alert, Ox3, nonfocal   Assessment:  1. Acute on CRF- due to intravasc vol depletion, low BP's and ACEI; creat stabilizing, 2.0 today. Good po intake > cont IVF at 50/hr, continue holding BP meds, check creat in am. Ready for discharge when renal function improving, possibly tomorrow 2. S/P surgical removal large renal stones, as above 3. CKD baseline creat 1.6 4. Morbid obesity 5. HTN- meds on hold  Vinson Moselle  MD Pager [phone]    Cell  [phone] 04/25/2013, 3:42 PM    Recent Labs Lab 04/23/13 0600 04/24/13 0834 04/25/13 0525  NA 133* 136 135  K 4.8 4.6 4.3  CL 97 103 101  CO2 27 26 24   GLUCOSE 101* 117* 110*  BUN 31* 31* 30*  CREATININE 2.74* 2.17* 2.07*  CALCIUM 9.2 8.6 8.9   No results found for this basename: AST, ALT, ALKPHOS, BILITOT, PROT, ALBUMIN,  in the last 168 hours  Recent Labs Lab 04/19/13 1523 04/20/13 0428 04/22/13 0430  WBC 5.5  --  9.4  HGB 14.1 12.7* 11.8*  HCT 41.3 38.0* 35.5*  MCV 94.7  --  95.7  PLT 180  --  152   . atorvastatin  40 mg Oral q morning - 10a  . docusate sodium  100 mg Oral BID  . fesoterodine  4 mg Oral Daily  . insulin aspart  0-20 Units Subcutaneous TID WC  . insulin aspart  4 Units Subcutaneous TID WC  . senna  1 tablet Oral BID  . tamsulosin   0.4 mg Oral Daily   . sodium chloride 50 mL/hr at 04/25/13 1039   fentaNYL, HYDROmorphone (DILAUDID) injection, menthol-cetylpyridinium, ondansetron, oxyCODONE, promethazine

## 2013-04-25 NOTE — Progress Notes (Signed)
No drainage noted in pouch to Rt flank area throughout the night.

## 2013-04-26 LAB — BASIC METABOLIC PANEL
BUN: 30 mg/dL — ABNORMAL HIGH (ref 6–23)
CO2: 23 mEq/L (ref 19–32)
Chloride: 100 mEq/L (ref 96–112)
Creatinine, Ser: 1.99 mg/dL — ABNORMAL HIGH (ref 0.50–1.35)
Glucose, Bld: 147 mg/dL — ABNORMAL HIGH (ref 70–99)
Potassium: 4.3 mEq/L (ref 3.5–5.1)

## 2013-04-26 LAB — GLUCOSE, CAPILLARY
Glucose-Capillary: 112 mg/dL — ABNORMAL HIGH (ref 70–99)
Glucose-Capillary: 98 mg/dL (ref 70–99)

## 2013-04-26 MED ORDER — CEPHALEXIN 250 MG PO CAPS: 250.0000 mg | ORAL_CAPSULE | Freq: Two times a day (BID) | ORAL | Status: DC

## 2013-04-26 MED ORDER — SENNOSIDES-DOCUSATE SODIUM 8.6-50 MG PO TABS: 1.0000 | ORAL_TABLET | Freq: Two times a day (BID) | ORAL | Status: DC

## 2013-04-26 MED ORDER — HYDROCODONE-ACETAMINOPHEN 10-325 MG PO TABS: 1.0000 | ORAL_TABLET | Freq: Four times a day (QID) | ORAL | Status: DC | PRN

## 2013-04-26 NOTE — Discharge Summary (Addendum)
Physician Discharge Summary  Patient ID: Alex Bowman MRN: [id number] DOB/AGE: 1945/08/15 67 y.o.  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 Had 1 prior episode colic with small stone that passed with mediical therapy. Now s/p Rt first stage percutanous nephrostolithtoomy with left ureteral stent placement 9/8 and second stage Rt PCNL with left ureteroscopy and bilateral stents 9/10 to goal of clinically stone free. Foley removed 9/11 with resumed normal voiding. CT 9/12 with no significant residual stone and no evidence of urinoma / hematoma.   2 - Renal Insuficiency - Cr 1.65 at ER 02/2013. Mild rt lower pole hydro from stone as per above. Uptrending post-op up to max 2.74. He was on BID lisinopril and additional BP meds previously whic are now held. CT 9/12 with stents in good position bilaterally and no fluid collections. Nephrology consulted 9/12 and agress stop ACEI and BP meds. By time of discharge Cr trending down to 1.99 with acceptable electolytes c/w likely pre-renal acute on chronic insufficiency.   Consults: nephrology  Significant Diagnostic Studies: labs: Cr at discharge 1.99. CT - no residual stones.  Treatments: surgery: as per above.  Discharge Exam: Blood pressure 115/60, pulse 77, temperature 98 F (36.7 C), temperature source Oral, resp. rate 18, height 5\' 9"  (1.753 m), weight 146.648 kg (323 lb 4.8 oz), SpO2 98.00%. General appearance: alert, cooperative, appears stated age and morbidly obese Head: Normocephalic, without obvious abnormality, atraumatic Eyes:  conjunctivae/corneas clear. PERRL, EOM's intact. Fundi benign. Ears: normal TM's and external ear canals both ears Nose: Nares normal. Septum midline. Mucosa normal. No drainage or sinus tenderness. Throat: lips, mucosa, and tongue normal; teeth and gums normal Neck: no adenopathy, no carotid bruit, no JVD, supple, symmetrical, trachea midline and thyroid not enlarged, symmetric, no tenderness/mass/nodules Back: symmetric, no curvature. ROM normal. No CVA tenderness. Resp: clear to auscultation bilaterally Chest wall: no tenderness Cardio: regular rate and rhythm, S1, S2 normal, no murmur, click, rub or gallop GI: soft, non-tender; bowel sounds normal; no masses,  no organomegaly Male genitalia: normal Extremities: extremities normal, atraumatic, no cyanosis or edema and venous stasis dermatitis noted Pulses: 2+ and symmetric Skin: Skin color, texture, turgor normal. No rashes or lesions Lymph nodes: Cervical, supraclavicular, and axillary nodes normal. Neurologic: Grossly normal Incision/Wound: Rt flank area with minimal and resolving output. Some stable ecchymoses. No purlulence.  Disposition: 01-Home or Self Care     Medication List    ASK your doctor about these medications       aspirin EC 81 MG tablet  Take 81 mg by mouth daily.     atorvastatin 40 MG tablet  Commonly known as:  LIPITOR  Take 40 mg by mouth every morning.     furosemide 20 MG tablet  Commonly known as:  LASIX  Take 20 mg by mouth daily as needed.     HYDROcodone-acetaminophen 10-325 MG per tablet  Commonly known as:  NORCO  Take 1 tablet by mouth at bedtime.     lisinopril-hydrochlorothiazide 20-12.5 MG per tablet  Commonly known as:  PRINZIDE,ZESTORETIC  Take 1 tablet by mouth 2 (two) times  daily.     metFORMIN 500 MG tablet  Commonly known as:  GLUCOPHAGE  Take 500 mg by mouth 2 (two) times daily with a meal.     multivitamin with minerals Tabs tablet  Take 1 tablet by mouth daily.      nebivolol 5 MG tablet  Commonly known as:  BYSTOLIC  Take 5 mg by mouth every morning.     sitaGLIPtin 50 MG tablet  Commonly known as:  JANUVIA  Take 50 mg by mouth every morning.         SignedSebastian Ache 04/26/2013, 12:33 PM  Pt's Chronic renal insufficiency with calculated GFR 33 is consistent with Stage 3 Chronic Kidney Disease.

## 2013-04-26 NOTE — Progress Notes (Signed)
Renal Service Daily Progress Note [facility]  Subjective: No complaints, creat down to 1.99 today and patient says "Dr. Berneice Heinrich may slip me out of here today"  Physical Exam:  Blood pressure 115/60, pulse 77, temperature 98 F (36.7 C), temperature source Oral, resp. rate 18, height 5\' 9"  (1.753 m), weight 146.648 kg (323 lb 4.8 oz), SpO2 98.00%. Gen: adult male, no distress, up in chair morbid obesity Chest: clear CV: irregular, no M or rub or gallop, pedal pulses not palpable  Abdomen: soft, marked obesity, nontender, no gross ascites  Back: fluid pouch on lower R back area  Ext: brawny skin changes bilat shins, minimal pretibial edema bilat Neuro: alert, Ox3, nonfocal  IVF running at 50/hour  Recent Labs Lab 04/24/13 0834 04/25/13 0525 04/26/13 0400  NA 136 135 133*  K 4.6 4.3 4.3  CL 103 101 100  CO2 26 24 23   GLUCOSE 117* 110* 147*  BUN 31* 30* 30*  CREATININE 2.17* 2.07* 1.99*  CALCIUM 8.6 8.9 9.0   No results found for this basename: AST, ALT, ALKPHOS, BILITOT, PROT, ALBUMIN,  in the last 168 hours  Recent Labs Lab 04/19/13 1523 04/20/13 0428 04/22/13 0430  WBC 5.5  --  9.4  HGB 14.1 12.7* 11.8*  HCT 41.3 38.0* 35.5*  MCV 94.7  --  95.7  PLT 180  --  152    Medications . atorvastatin  40 mg Oral q morning - 10a  . docusate sodium  100 mg Oral BID  . fesoterodine  4 mg Oral Daily  . insulin aspart  0-20 Units Subcutaneous TID WC  . insulin aspart  4 Units Subcutaneous TID WC  . senna  1 tablet Oral BID  . tamsulosin  0.4 mg Oral Daily   . sodium chloride 50 mL/hr at 04/26/13 0659   fentaNYL, HYDROmorphone (DILAUDID) injection, menthol-cetylpyridinium, ondansetron, oxyCODONE, promethazine   Assessment/Recommendations: 1. Acute on CRF- due to intravasc vol depletion, low BP's and ACEI -  post stone procedure.  Creatinine slowly falling.  Could probably be discharged today.  Would NOT restart his ACE inhibitor (BP is fine right now without  it) until renal function reaches plateau as outpatient. 2. S/P surgical removal large renal stones - plans per Dr. Berneice Heinrich 3. CKD baseline creat 1.6 4. Morbid obesity 5. HTN- meds on hold 6. Will sign off at this time. Recommendations as noted above.    Sadie Haber, MD

## 2013-04-26 NOTE — Clinical Documentation Improvement (Signed)
THIS DOCUMENT IS NOT A PERMANENT PART OF THE MEDICAL RECORD  Please update your documentation with the medical record to reflect your response to this query. If you need help knowing how to do this please call [phone].  04/26/13   Dear Dr. Gevena Cotton,  A review of the patient medical record has revealed the following indicators.  Based on your clinical judgment, please clarify and document in a progress note and/or discharge summary the clinical condition associated with the following supporting information:  Per 9/14 progress note:  Acute on CRF- due to intravasc vol depletion, low BP's and ACEI; creat stabilizing, 2.0 today. Good po intake > cont IVF at 50/hr, continue holding BP meds, check creat in am. Ready for discharge when renal function improving, possibly tomorrow  Labs:  BUN/CR/GFR 9/11 = 25/1.97/33 9/12 = 31/2.74/22 9/13 = 31/2.17/30 9/14 = 30/2.07/31 9/15 = 30/1.99/33  Treatment: Monitor BMP daily Hydrate  Possible Clinical Conditions?   CKD Stage I -  GFR > OR = 90 CKD Stage II - GFR 60-80 CKD Stage III - GFR 30-59 CKD Stage IV - GFR 15-29 CKD Stage V - GFR < 15 ESRD (End Stage Renal Disease) Other condition_____________ Cannot Clinically determine    You may use possible, probable, or suspect with inpatient documentation. possible, probable, suspected diagnoses MUST be documented at the time of discharge  Reviewed:  no additional documentation provided   Thank You,  Harless Litten RN Clinical Documentation Specialist: [phone] Health Information Management [place]

## 2013-04-26 NOTE — Progress Notes (Signed)
Discharge to home, wife at bedside, discharge instructions and follow up appointment done and was given to the patient. PIV removed no s/s of infiltration and swelling noted. Patient no complaints of any pain or discomfort upon discharge.

## 2013-11-01 ENCOUNTER — Encounter: Payer: Self-pay | Admitting: Gastroenterology

## 2014-09-07 ENCOUNTER — Encounter: Payer: Self-pay | Admitting: Gastroenterology

## 2014-11-03 ENCOUNTER — Ambulatory Visit (AMBULATORY_SURGERY_CENTER): Payer: Self-pay | Admitting: *Deleted

## 2014-11-03 VITALS — Ht 69.0 in | Wt 320.6 lb

## 2014-11-03 DIAGNOSIS — Z86010 Personal history of colonic polyps: Secondary | ICD-10-CM

## 2014-11-03 MED ORDER — MOVIPREP 100 G PO SOLR
1.0000 | Freq: Once | ORAL | Status: DC
Start: 2014-11-03 — End: 2014-11-18

## 2014-11-03 NOTE — Progress Notes (Signed)
No egg or soy allergy No diet pills No home 02 use No issues with past sedation but has had a colon and was awake with the whole surgery.last colon 12-08-2008 [facility] w/ Ardis Hughs and had MAC sedation  emmi declined

## 2014-11-18 ENCOUNTER — Ambulatory Visit (AMBULATORY_SURGERY_CENTER): Payer: Medicare Other | Admitting: Gastroenterology

## 2014-11-18 ENCOUNTER — Encounter: Payer: Self-pay | Admitting: Gastroenterology

## 2014-11-18 VITALS — BP 147/71 | HR 57 | Temp 96.7°F | Resp 13 | Ht 69.0 in | Wt 320.0 lb

## 2014-11-18 DIAGNOSIS — D123 Benign neoplasm of transverse colon: Secondary | ICD-10-CM | POA: Diagnosis not present

## 2014-11-18 DIAGNOSIS — D120 Benign neoplasm of cecum: Secondary | ICD-10-CM | POA: Diagnosis not present

## 2014-11-18 DIAGNOSIS — Z86010 Personal history of colonic polyps: Secondary | ICD-10-CM | POA: Diagnosis not present

## 2014-11-18 LAB — GLUCOSE, CAPILLARY
GLUCOSE-CAPILLARY: 104 mg/dL — AB (ref 70–99)
GLUCOSE-CAPILLARY: 100 mg/dL — AB (ref 70–99)

## 2014-11-18 MED ORDER — SODIUM CHLORIDE 0.9 % IV SOLN: 500.0000 mL | INTRAVENOUS | Status: DC

## 2014-11-18 NOTE — Op Note (Addendum)
[place]  [address] [place], [id number]   COLONOSCOPY PROCEDURE REPORT  PATIENT: Alex Bowman, Alex Bowman  MR#: [id number] BIRTHDATE: [birth year] , 68  yrs. old GENDER: male ENDOSCOPIST: Milus Banister, MD PROCEDURE DATE:  11/18/2014 PROCEDURE:   Colonoscopy with snare polypectomy First Screening Colonoscopy - Avg.  risk and is 50 yrs.  old or older - No.  Prior Negative Screening - Now for repeat screening. N/A  History of Adenoma - Now for follow-up colonoscopy & has been > or = to 3 yrs.  Yes hx of adenoma.  Has been 3 or more years since last colonoscopy. ASA CLASS:   Class II INDICATIONS:Surveillance due to prior colonic neoplasia (colonoscopy Dr. Ardis Hughs 2010 one small ademoma, previous colonoscopy Dr. Sharlett Iles 3 small adenomas MEDICATIONS: Monitored anesthesia care and Propofol 300 mg IV  DESCRIPTION OF PROCEDURE:   After the risks benefits and alternatives of the procedure were thoroughly explained, informed consent was obtained.  The digital rectal exam revealed no abnormalities of the rectum.   The LB [id number]  endoscope was introduced through the anus and advanced to the cecum, which was identified by both the appendix and ileocecal valve. No adverse events experienced.   The quality of the prep was excellent.  The instrument was then slowly withdrawn as the colon was fully examined.  COLON FINDINGS: Three sessile polyps ranging between 5-27mm in size were found in the transverse colon and at the cecum.  A polypectomy was performed using snare cautery for the cecal polyp and cold snare was used for the transverse polyps.  The resection was complete, the polyp tissue was completely retrieved and sent to histology.  Retroflexed views revealed no abnormalities. The time to cecum = 3.7 Withdrawal time = 12.8   The scope was withdrawn and the procedure completed. COMPLICATIONS: There were no immediate complications.  ENDOSCOPIC IMPRESSION: Three  sessile polyps ranging between 5-70mm in size were found in the transverse colon and at the cecum; these were all removed  RECOMMENDATIONS: 1.  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 2.  You will receive a letter within 1-2 weeks with the results of your biopsy as well as final recommendations.  Please call my office if you have not received a letter after 3 weeks.  eSigned:  Milus Banister, MD 11/18/2014 11:25 AM Revised: 11/18/2014 11:25 AM  cc: [name], MD

## 2014-11-18 NOTE — Progress Notes (Signed)
A/ox3 pleased with MAC, report to Tracy RN 

## 2014-11-18 NOTE — Patient Instructions (Signed)
Impressions/recommendations:  Polyps (handout given)  Repeat colonoscopy in 5 years.  YOU HAD AN ENDOSCOPIC PROCEDURE TODAY AT [facility]:   Refer to the procedure report that was given to you for any specific questions about what was found during the examination.  If the procedure report does not answer your questions, please call your gastroenterologist to clarify.  If you requested that your care partner not be given the details of your procedure findings, then the procedure report has been included in a sealed envelope for you to review at your convenience later.  YOU SHOULD EXPECT: Some feelings of bloating in the abdomen. Passage of more gas than usual.  Walking can help get rid of the air that was put into your GI tract during the procedure and reduce the bloating. If you had a lower endoscopy (such as a colonoscopy or flexible sigmoidoscopy) you may notice spotting of blood in your stool or on the toilet paper. If you underwent a bowel prep for your procedure, you may not have a normal bowel movement for a few days.  Please Note:  You might notice some irritation and congestion in your nose or some drainage.  This is from the oxygen used during your procedure.  There is no need for concern and it should clear up in a day or so.  SYMPTOMS TO REPORT IMMEDIATELY:   Following lower endoscopy (colonoscopy or flexible sigmoidoscopy):  Excessive amounts of blood in the stool  Significant tenderness or worsening of abdominal pains  Swelling of the abdomen that is new, acute  Fever of 100F or higher   For urgent or emergent issues, a gastroenterologist can be reached at any hour by calling [phone].   DIET: Your first meal following the procedure should be a small meal and then it is ok to progress to your normal diet. Heavy or fried foods are harder to digest and may make you feel nauseous or bloated.  Likewise, meals heavy in dairy and vegetables can increase  bloating.  Drink plenty of fluids but you should avoid alcoholic beverages for 24 hours.  ACTIVITY:  You should plan to take it easy for the rest of today and you should NOT DRIVE or use heavy machinery until tomorrow (because of the sedation medicines used during the test).    FOLLOW UP: Our staff will call the number listed on your records the next business day following your procedure to check on you and address any questions or concerns that you may have regarding the information given to you following your procedure. If we do not reach you, we will leave a message.  However, if you are feeling well and you are not experiencing any problems, there is no need to return our call.  We will assume that you have returned to your regular daily activities without incident.  If any biopsies were taken you will be contacted by phone or by letter within the next 1-3 weeks.  Please call us at [phone] if you have not heard about the biopsies in 3 weeks.    SIGNATURES/CONFIDENTIALITY: You and/or your care partner have signed paperwork which will be entered into your electronic medical record.  These signatures attest to the fact that that the information above on your After Visit Summary has been reviewed and is understood.  Full responsibility of the confidentiality of this discharge information lies with you and/or your care-partner.

## 2014-11-18 NOTE — Progress Notes (Signed)
Called to room to assist during endoscopic procedure.  Patient ID and intended procedure confirmed with present staff. Received instructions for my participation in the procedure from the performing physician.  

## 2014-11-21 ENCOUNTER — Telehealth: Payer: Self-pay | Admitting: *Deleted

## 2014-11-21 NOTE — Telephone Encounter (Signed)
  Follow up Call-  Call back number 11/18/2014  Post procedure Call Back phone  # [phone]  Permission to leave phone message Yes     Patient questions:  Do you have a fever, pain , or abdominal swelling? No. Pain Score  0 *  Have you tolerated food without any problems? Yes.    Have you been able to return to your normal activities? Yes.    Do you have any questions about your discharge instructions: Diet   No. Medications  No. Follow up visit  No.  Do you have questions or concerns about your Care? No.  Actions: * If pain score is 4 or above: No action needed, pain <4.

## 2014-11-23 ENCOUNTER — Encounter: Payer: Self-pay | Admitting: Gastroenterology

## 2016-10-24 ENCOUNTER — Emergency Department (HOSPITAL_COMMUNITY)
Admission: EM | Admit: 2016-10-24 | Discharge: 2016-10-24 | Disposition: A | Discharge status: Home / Self Care | Payer: Medicare Other | Attending: Emergency Medicine | Admitting: Emergency Medicine

## 2016-10-24 ENCOUNTER — Emergency Department (HOSPITAL_COMMUNITY): Payer: Medicare Other

## 2016-10-24 ENCOUNTER — Encounter (HOSPITAL_COMMUNITY): Payer: Self-pay

## 2016-10-24 DIAGNOSIS — Y939 Activity, unspecified: Secondary | ICD-10-CM | POA: Diagnosis not present

## 2016-10-24 DIAGNOSIS — N189 Chronic kidney disease, unspecified: Secondary | ICD-10-CM | POA: Diagnosis not present

## 2016-10-24 DIAGNOSIS — I129 Hypertensive chronic kidney disease with stage 1 through stage 4 chronic kidney disease, or unspecified chronic kidney disease: Secondary | ICD-10-CM | POA: Diagnosis not present

## 2016-10-24 DIAGNOSIS — Z7982 Long term (current) use of aspirin: Secondary | ICD-10-CM | POA: Diagnosis not present

## 2016-10-24 DIAGNOSIS — Y929 Unspecified place or not applicable: Secondary | ICD-10-CM | POA: Insufficient documentation

## 2016-10-24 DIAGNOSIS — W000XXA Fall on same level due to ice and snow, initial encounter: Secondary | ICD-10-CM | POA: Insufficient documentation

## 2016-10-24 DIAGNOSIS — S3992XA Unspecified injury of lower back, initial encounter: Secondary | ICD-10-CM | POA: Diagnosis present

## 2016-10-24 DIAGNOSIS — E1122 Type 2 diabetes mellitus with diabetic chronic kidney disease: Secondary | ICD-10-CM | POA: Diagnosis not present

## 2016-10-24 DIAGNOSIS — Z87891 Personal history of nicotine dependence: Secondary | ICD-10-CM | POA: Insufficient documentation

## 2016-10-24 DIAGNOSIS — Z79899 Other long term (current) drug therapy: Secondary | ICD-10-CM | POA: Insufficient documentation

## 2016-10-24 DIAGNOSIS — Z7984 Long term (current) use of oral hypoglycemic drugs: Secondary | ICD-10-CM | POA: Insufficient documentation

## 2016-10-24 DIAGNOSIS — Z955 Presence of coronary angioplasty implant and graft: Secondary | ICD-10-CM | POA: Insufficient documentation

## 2016-10-24 DIAGNOSIS — S7012XA Contusion of left thigh, initial encounter: Secondary | ICD-10-CM

## 2016-10-24 DIAGNOSIS — Y999 Unspecified external cause status: Secondary | ICD-10-CM | POA: Insufficient documentation

## 2016-10-24 DIAGNOSIS — S7002XA Contusion of left hip, initial encounter: Secondary | ICD-10-CM | POA: Diagnosis not present

## 2016-10-24 DIAGNOSIS — S39012A Strain of muscle, fascia and tendon of lower back, initial encounter: Secondary | ICD-10-CM | POA: Diagnosis not present

## 2016-10-24 DIAGNOSIS — I480 Paroxysmal atrial fibrillation: Secondary | ICD-10-CM | POA: Diagnosis not present

## 2016-10-24 LAB — COMPREHENSIVE METABOLIC PANEL
ALBUMIN: 3.8 g/dL (ref 3.5–5.0)
ALT: 14 U/L — AB (ref 17–63)
AST: 25 U/L (ref 15–41)
Alkaline Phosphatase: 64 U/L (ref 38–126)
Anion gap: 9 (ref 5–15)
BUN: 25 mg/dL — AB (ref 6–20)
CHLORIDE: 104 mmol/L (ref 101–111)
CO2: 25 mmol/L (ref 22–32)
CREATININE: 1.35 mg/dL — AB (ref 0.61–1.24)
Calcium: 9.1 mg/dL (ref 8.9–10.3)
GFR calc Af Amer: 59 mL/min — ABNORMAL LOW (ref >60)
GFR calc non Af Amer: 51 mL/min — ABNORMAL LOW (ref >60)
GLUCOSE: 96 mg/dL (ref 65–99)
POTASSIUM: 4.2 mmol/L (ref 3.5–5.1)
SODIUM: 138 mmol/L (ref 135–145)
Total Bilirubin: 1 mg/dL (ref 0.3–1.2)
Total Protein: 7.4 g/dL (ref 6.5–8.1)

## 2016-10-24 LAB — URINALYSIS, ROUTINE W REFLEX MICROSCOPIC
Bilirubin Urine: NEGATIVE
Glucose, UA: NEGATIVE mg/dL
HGB URINE DIPSTICK: NEGATIVE
Ketones, ur: NEGATIVE mg/dL
Nitrite: NEGATIVE
PROTEIN: NEGATIVE mg/dL
SPECIFIC GRAVITY, URINE: 1.017 (ref 1.005–1.030)
SQUAMOUS EPITHELIAL / LPF: NONE SEEN
pH: 5 (ref 5.0–8.0)

## 2016-10-24 LAB — CBC WITH DIFFERENTIAL/PLATELET
Basophils Absolute: 0 10*3/uL (ref 0.0–0.1)
Basophils Relative: 0 %
EOS ABS: 0.8 10*3/uL — AB (ref 0.0–0.7)
EOS PCT: 9 %
HCT: 42.7 % (ref 39.0–52.0)
Hemoglobin: 13.5 g/dL (ref 13.0–17.0)
LYMPHS ABS: 2.2 10*3/uL (ref 0.7–4.0)
LYMPHS PCT: 24 %
MCH: 29.7 pg (ref 26.0–34.0)
MCHC: 31.6 g/dL (ref 30.0–36.0)
MCV: 94.1 fL (ref 78.0–100.0)
MONOS PCT: 9 %
Monocytes Absolute: 0.8 10*3/uL (ref 0.1–1.0)
Neutro Abs: 5.3 10*3/uL (ref 1.7–7.7)
Neutrophils Relative %: 58 %
PLATELETS: 189 10*3/uL (ref 150–400)
RBC: 4.54 MIL/uL (ref 4.22–5.81)
RDW: 15.6 % — ABNORMAL HIGH (ref 11.5–15.5)
WBC: 9.2 10*3/uL (ref 4.0–10.5)

## 2016-10-24 LAB — TROPONIN I

## 2016-10-24 MED ORDER — METOPROLOL SUCCINATE ER 25 MG PO TB24: 37.5000 mg | ORAL_TABLET | Freq: Every day | ORAL | 0 refills | Status: DC

## 2016-10-24 MED ORDER — METOPROLOL TARTRATE 5 MG/5ML IV SOLN
2.5000 mg | Freq: Once | INTRAVENOUS | Status: AC
  Administered 2016-10-24: 2.5 mg via INTRAVENOUS
  Filled 2016-10-24: qty 5

## 2016-10-24 NOTE — Discharge Instructions (Signed)
Call your doctor tomorrow for an appointment tomorrow to discuss your atrial fibrillation and anticoagulation for this.

## 2016-10-24 NOTE — ED Notes (Signed)
Bed: PZ96 Expected date:  Expected time:  Means of arrival:  Comments: 71 yo m, fall back pain

## 2016-10-24 NOTE — ED Provider Notes (Addendum)
[facility] DEPT Provider Note   CSN: [id number] Arrival date & time: 10/24/16  1252     History   Chief Complaint Chief Complaint  Patient presents with  . Fall    HPI Alex Bowman is a 71 y.o. male.  HPI  71 year old male presents with difficulty getting up after 2 falls today. Patient states that in the snow he fell 3 days ago. He landed on his left lateral hip. He's been able to get up and ambulate with his cane like normal in between but then today had 2 different falls when getting up out of a chair. Both times he says that he got up and was using his cane and then his legs did not seem to be able to support him and he fell. He had to get himself back up with a lift machine. Previously 3 days ago EMS had to pick him up. After the first fall his left hip is bothering him again. On the second fall he fell forwards and then backwards. He is having low back pain since. Denies any focal weakness or numbness in his extremities. He's not sure if one leg gave out on him or both. He has not had any headaches, vomiting, chest pain, short of breath, or fevers. No urinary symptoms. Declines pain medicine.  Past Medical History:  Diagnosis Date  . Arthritis   . Bilateral lower extremity edema    Chronic, with venous insufficiency  . Cataract    beginning  . Chronic kidney disease    kidney stones  . Complication of anesthesia    stayed awake during colonoscopy  . Diabetes mellitus without complication ([place])    "on medicine, but numbers have been good for years since weight loss"  . Dyslipidemia    Monitored by Primary Physician. On statin  . GERD (gastroesophageal reflux disease)   . Hypertension   . Knee pain    R>L  . Morbid obesity with BMI of 45.0-49.9, adult ([place])    Attempted weight loss efforts at last visit had BMI down to 42, unfortunately back up to 46 with weight going up to 312 lb from 285 lb  . Pneumonia 20 years ago   h/o    Patient Active Problem List   Diagnosis Date Noted  . Severe obesity (BMI >= 40) ([place]) 03/13/2013  . Dyslipidemia   . Hypertension   . Diabetes mellitus without complication ([place])   . Bilateral lower extremity edema   . OSA (obstructive sleep apnea)   . Metabolic syndrome 55/97/4163    Past Surgical History:  Procedure Laterality Date  . 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 Mild elevation in LVDP, EF 55%. --> False-positive stress test  . COLONOSCOPY    . CYSTOSCOPY WITH STENT PLACEMENT Left 04/19/2013   Procedure: CYSTOSCOPY WITH STENT PLACEMENT;  Surgeon: Alexis Frock, MD;  Location: [facility];  Service: Urology;  Laterality: Left;  RIGHT RETROGRADE PYELOGRAM  . CYSTOSCOPY WITH URETEROSCOPY Left 04/21/2013   Procedure: CYSTOSCOPY WITH URETEROSCOPY bilateral retrograde, bilateral stent change, stone extraction;  Surgeon: Alexis Frock, MD;  Location: [facility];  Service: Urology;  Laterality: Left;  bilateral ureters  . HAND SURGERY Bilateral 15 years ago  . HOLMIUM LASER APPLICATION Right 8/45/3646   Procedure: HOLMIUM LASER APPLICATION;  Surgeon: Alexis Frock, MD;  Location: [facility];  Service: Urology;  Laterality: Right;  . NEPHROLITHOTOMY Right 04/19/2013   Procedure: NEPHROLITHOTOMY PERCUTANEOUS;  Surgeon: Alexis Frock,  MD;  Location: [facility];  Service: Urology;  Laterality: Right;  . NEPHROLITHOTOMY Right 04/21/2013   Procedure: NEPHROLITHOTOMY PERCUTANEOUS RIGHT 2ND STAGE PERCUTANEOUS NEPHROLITHOTOMY;  Surgeon: Alexis Frock, MD;  Location: [facility];  Service: Urology;  Laterality: Right;  . NM MYOVIEW LTD; [place]  May 2012   Suggested as high risk with 3 times a day 1.2 to, mild to moderate 3 times a day affecting basolateral and mid lateral walls.  [name] POLYPECTOMY         Home Medications    Prior to Admission medications   Medication Sig Start Date End Date Taking? Authorizing Provider  aspirin 325 MG tablet Take 325 mg by mouth daily.   Yes Historical  Provider, MD  atorvastatin (LIPITOR) 40 MG tablet Take 40 mg by mouth every morning.   Yes Historical Provider, MD  cholecalciferol (VITAMIN D) 1000 UNITS tablet Take 1,000 Units by mouth daily.   Yes Historical Provider, MD  lisinopril (PRINIVIL,ZESTRIL) 20 MG tablet TAKE 1 TABLET BY MOUTH TWICE DAILY 07/11/14  Yes Historical Provider, MD  metFORMIN (GLUCOPHAGE) 500 MG tablet Take 500 mg by mouth 2 (two) times daily with a meal.   Yes Historical Provider, MD  Multiple Vitamin (MULTIVITAMIN WITH MINERALS) TABS tablet Take 1 tablet by mouth daily.   Yes Historical Provider, MD  solifenacin (VESICARE) 5 MG tablet Take 5 mg by mouth daily.   Yes Historical Provider, MD  HYDROcodone-acetaminophen (NORCO) 10-325 MG per tablet Take 1 tablet by mouth every 6 (six) hours as needed for pain. Patient not taking: Reported on 10/24/2016 04/26/13   Alexis Frock, MD  metoprolol succinate (TOPROL-XL) 25 MG 24 hr tablet Take 1.5 tablets (37.5 mg total) by mouth daily. TAKE 1 TABLET BY MOUTH EVERY DAY. 10/24/16   Sherwood Gambler, MD    Family History Family History  Problem Relation Age of Onset  . Breast cancer Mother   . Prostate cancer Father   . Colon cancer Neg Hx     Social History Social History  Substance Use Topics  . Smoking status: Former Smoker    Packs/day: 1.00    Years: 30.00    Types: Cigarettes    Quit date: 03/02/1997  . Smokeless tobacco: Former Systems developer    Types: Chew    Quit date: 08/12/2002  . Alcohol use No     Allergies   Patient has no known allergies.   Review of Systems Review of Systems  Respiratory: Negative for shortness of breath.   Cardiovascular: Negative for chest pain, palpitations and leg swelling.  Gastrointestinal: Negative for abdominal pain and vomiting.  Musculoskeletal: Positive for arthralgias and back pain.  Neurological: Negative for weakness, numbness and headaches.  All other systems reviewed and are negative.    Physical Exam Updated Vital  Signs BP (!) 145/92   Pulse (!) 113   Temp 97.6 F (36.4 C) (Oral)   Resp 11   Ht 5\' 10"  (1.778 m)   Wt 292 lb (132.5 kg)   SpO2 98%   BMI 41.90 kg/m   Physical Exam  Constitutional: He is oriented to person, place, and time. He appears well-developed and well-nourished.  Morbidly obese  HENT:  Head: Normocephalic and atraumatic.  Right Ear: External ear normal.  Left Ear: External ear normal.  Nose: Nose normal.  Eyes: EOM are normal. Pupils are equal, round, and reactive to light. Right eye exhibits no discharge. Left eye exhibits no discharge.  Neck: Neck supple.  Cardiovascular: Normal heart sounds.  An  irregular rhythm present. Tachycardia present.   Pulmonary/Chest: Effort normal and breath sounds normal.  Abdominal: Soft. There is no tenderness.  Musculoskeletal: He exhibits no edema.       Left hip: He exhibits tenderness (lateral over greater trochanter). He exhibits normal range of motion.       Thoracic back: He exhibits no tenderness.       Lumbar back: He exhibits tenderness (mild).       Left upper leg: He exhibits no tenderness.  Neurological: He is alert and oriented to person, place, and time.  CN 3-12 grossly intact. 5/5 strength in all 4 extremities. Grossly normal sensation. Normal finger to nose.   Skin: Skin is warm and dry.  Nursing note and vitals reviewed.    ED Treatments / Results  Labs (all labs ordered are listed, but only abnormal results are displayed) Labs Reviewed  COMPREHENSIVE METABOLIC PANEL - Abnormal; Notable for the following:       Result Value   BUN 25 (*)    Creatinine, Ser 1.35 (*)    ALT 14 (*)    GFR calc non Af Amer 51 (*)    GFR calc Af Amer 59 (*)    All other components within normal limits  CBC WITH DIFFERENTIAL/PLATELET - Abnormal; Notable for the following:    RDW 15.6 (*)    Eosinophils Absolute 0.8 (*)    All other components within normal limits  URINALYSIS, ROUTINE W REFLEX MICROSCOPIC - Abnormal; Notable for  the following:    Leukocytes, UA TRACE (*)    Bacteria, UA RARE (*)    All other components within normal limits  TROPONIN I    EKG  EKG Interpretation  Date/Time:  Thursday October 24 2016 14:30:55 EDT Ventricular Rate:  117 PR Interval:    QRS Duration: 90 QT Interval:  352 QTC Calculation: 492 R Axis:   40 Text Interpretation:  Atrial fibrillation Low voltage, precordial leads Borderline prolonged QT interval Baseline wander in lead(s) I afib is new compared to 2010 Confirmed by Pacey Altizer MD, [place] [phone]) on 10/24/2016 2:35:29 PM Also confirmed by Regenia Skeeter MD, [place] [phone]), editor Drema Pry [phone])  on 10/24/2016 2:58:57 PM       Radiology Dg Chest 2 View  Result Date: 10/24/2016 CLINICAL DATA:  Weakness. Loss of balance with recurrent falls for several days. EXAM: CHEST  2 VIEW COMPARISON:  04/13/2013 FINDINGS: The heart size and mediastinal contours are within normal limits allowing for low lung volumes. Both lungs are clear. No evidence of pneumothorax or pleural effusion. The visualized skeletal structures are unremarkable. IMPRESSION: Stable low lung volumes.  No active cardiopulmonary disease. Electronically Signed   By: Earle Gell M.D.   On: 10/24/2016 16:58   Dg Lumbar Spine Complete  Result Date: 10/24/2016 CLINICAL DATA:  Falls.  Pain. EXAM: LUMBAR SPINE - COMPLETE 4+ VIEW COMPARISON:  09/24/2016. FINDINGS: Diffuse multilevel degenerative change. 7 mm anterolisthesis L4 on L5 . 5 mm retrolisthesis L3 on L4. 6 mm retrolisthesis L2 on L3. No acute bony abnormality identified. Calcification of the right kidney suggesting nephrolithiasis. Pelvic calcifications most consistent phleboliths. Aortoiliac atherosclerotic vascular calcification . IMPRESSION: 1. Diffuse multilevel degenerative change 7 mm anterolisthesis L4 on L5. 5 mm retrolisthesis L3 on L4. 6 mm retrolisthesis L2 on L3. No acute abnormality . 2. Right nephrolithiasis. 3. Aortoiliac atherosclerotic vascular  disease. Electronically Signed   By: Marcello Moores  Register   On: 10/24/2016 17:01   Dg Hip Unilat With Pelvis 2-3 Views  Left  Result Date: 10/24/2016 CLINICAL DATA:  Multiple recent false with persistent left hip pain. EXAM: DG HIP (WITH OR WITHOUT PELVIS) 2-3V LEFT COMPARISON:  KUB of September 24, 2016 FINDINGS: The bony pelvis is subjectively adequately mineralized. No acute fracture is observed. AP and lateral views of the left hip reveal reasonable preservation of the joint space. The articular surfaces of the femoral head and acetabulum remains smoothly rounded. The femoral neck, intertrochanteric, and subtrochanteric regions are normal. IMPRESSION: There is no acute or significant chronic bony abnormality of the left hip. Electronically Signed   By: David  Martinique M.D.   On: 10/24/2016 13:50    Procedures Procedures (including critical care time)  Medications Ordered in ED Medications  metoprolol (LOPRESSOR) injection 2.5 mg (2.5 mg Intravenous Given 10/24/16 1703)     Initial Impression / Assessment and Plan / ED Course  I have reviewed the triage vital signs and the nursing notes.  Pertinent labs & imaging results that were available during my care of the patient were reviewed by me and considered in my medical decision making (see chart for details).  Clinical Course as of Oct 24 1956  Thu Oct 24, 2016  1355 Weakness workup, nothing focal  [SG]  1436 ECG shows afib. This appears to be new though no clear onset.  [SG]    Clinical Course User Index [SG] Sherwood Gambler, MD    Chart review shows that last year he had paroxysmal afib. Put on xarelto, then when he no longer had afib was placed on full asa. Now has asymptomatic afib, rate slightly tachycardic, variable. Not symptomatic. Does not appear to be actually weak, likely has trouble getting up given morbid obesity and deconditioning. However he got out of bed by himself and walked with a walker without difficulty. Has a walker at  home. No acute fractures. Doubt occult hip fracture. CHADSVASC score is 3. Given this I had a discussion with patient/family about NOAC. At this time, decision made to f/u closely with PCP given he is on full dose ASA, recently has multiple falls and thus higher risk to put on anti-coagulation. f/u closely with PCP. Strict return precautions. Will increase his dose of home metoprolol  Final Clinical Impressions(s) / ED Diagnoses   Final diagnoses:  PAF (paroxysmal atrial fibrillation) (HCC)  Contusion of left hip and thigh, initial encounter  Strain of lumbar region, initial encounter    New Prescriptions Discharge Medication List as of 10/24/2016  5:17 PM       Sherwood Gambler, MD 10/24/16 [id number]    Sherwood Gambler, MD 10/24/16 8469

## 2016-10-24 NOTE — ED Triage Notes (Signed)
Per EMS, complains of lower back pain and increased falls. Pt had fall 3 days ago. Pt had 2 falls today, no loss of consciousness. 12 lead unremarkable. Pt denies dizziness, states he loses balance.

## 2016-10-24 NOTE — ED Notes (Signed)
Ambulated patient 40 feet with the assistance of a walker.

## 2016-10-30 ENCOUNTER — Ambulatory Visit (HOSPITAL_COMMUNITY)
Admission: RE | Admit: 2016-10-30 | Discharge: 2016-10-30 | Disposition: A | Discharge status: Home / Self Care | Payer: Medicare Other | Source: Ambulatory Visit | Attending: Nurse Practitioner | Admitting: Nurse Practitioner

## 2016-10-30 VITALS — BP 132/84 | HR 135 | Ht 70.0 in | Wt 316.0 lb

## 2016-10-30 DIAGNOSIS — Z79899 Other long term (current) drug therapy: Secondary | ICD-10-CM | POA: Insufficient documentation

## 2016-10-30 DIAGNOSIS — K219 Gastro-esophageal reflux disease without esophagitis: Secondary | ICD-10-CM | POA: Insufficient documentation

## 2016-10-30 DIAGNOSIS — I4819 Other persistent atrial fibrillation: Secondary | ICD-10-CM

## 2016-10-30 DIAGNOSIS — I481 Persistent atrial fibrillation: Secondary | ICD-10-CM | POA: Insufficient documentation

## 2016-10-30 DIAGNOSIS — Z87442 Personal history of urinary calculi: Secondary | ICD-10-CM | POA: Diagnosis not present

## 2016-10-30 DIAGNOSIS — Z87891 Personal history of nicotine dependence: Secondary | ICD-10-CM | POA: Diagnosis not present

## 2016-10-30 DIAGNOSIS — E1122 Type 2 diabetes mellitus with diabetic chronic kidney disease: Secondary | ICD-10-CM | POA: Diagnosis not present

## 2016-10-30 DIAGNOSIS — Z7901 Long term (current) use of anticoagulants: Secondary | ICD-10-CM | POA: Diagnosis not present

## 2016-10-30 DIAGNOSIS — N189 Chronic kidney disease, unspecified: Secondary | ICD-10-CM | POA: Insufficient documentation

## 2016-10-30 DIAGNOSIS — Z7984 Long term (current) use of oral hypoglycemic drugs: Secondary | ICD-10-CM | POA: Insufficient documentation

## 2016-10-30 DIAGNOSIS — I872 Venous insufficiency (chronic) (peripheral): Secondary | ICD-10-CM | POA: Insufficient documentation

## 2016-10-30 DIAGNOSIS — M1990 Unspecified osteoarthritis, unspecified site: Secondary | ICD-10-CM | POA: Insufficient documentation

## 2016-10-30 DIAGNOSIS — I129 Hypertensive chronic kidney disease with stage 1 through stage 4 chronic kidney disease, or unspecified chronic kidney disease: Secondary | ICD-10-CM | POA: Diagnosis not present

## 2016-10-30 DIAGNOSIS — Z803 Family history of malignant neoplasm of breast: Secondary | ICD-10-CM | POA: Diagnosis not present

## 2016-10-30 DIAGNOSIS — Z8042 Family history of malignant neoplasm of prostate: Secondary | ICD-10-CM | POA: Insufficient documentation

## 2016-10-30 DIAGNOSIS — E785 Hyperlipidemia, unspecified: Secondary | ICD-10-CM | POA: Diagnosis not present

## 2016-10-30 DIAGNOSIS — Z6841 Body Mass Index (BMI) 40.0 and over, adult: Secondary | ICD-10-CM | POA: Diagnosis not present

## 2016-10-30 MED ORDER — APIXABAN 5 MG PO TABS: 5.0000 mg | ORAL_TABLET | Freq: Two times a day (BID) | ORAL | 3 refills | Status: DC

## 2016-10-30 MED ORDER — METOPROLOL SUCCINATE ER 50 MG PO TB24: 50.0000 mg | ORAL_TABLET | Freq: Every day | ORAL | 3 refills | Status: DC

## 2016-10-30 MED ORDER — APIXABAN 5 MG PO TABS: 5.0000 mg | ORAL_TABLET | Freq: Two times a day (BID) | ORAL | 0 refills | Status: DC

## 2016-10-30 NOTE — Patient Instructions (Signed)
Your physician has recommended you make the following change in your medication:  1)Stop aspirin 2)Start Eliquis 5mg  twice a day 3)Increase metoprolol to 50mg  once a day

## 2016-10-31 NOTE — Progress Notes (Signed)
Primary Care Physician: Alex Inches, MD Referring Physician:[facility] ER f/u   Alex Bowman is a 71 y.o. male with a h/o morbid obesity, DM, HTN, that presented to [facility] ER after 2 falls in one day. He is not sure why he fell but  felt that his legs would not support him. Alex Bowman He fell 2 days earlier on the ice. He does have issues with bad knees and walks with a walker. He is trying to lose weight to get below 300 lbs so he can have knee surgery. In fact, he was started on phentermine 1-2 months ago to help with weight lose. He has lost 22 lbs.  Weight 15 months ago, he was over 400 lbs.EKG in the ER showed Afib with v rate of 113 bpm.  He was  not started on anticoagulant in the ER due to falls but pt states that he does not normally fall.   He is not aware of irregular heart beat this am with afib at 135 bpm. He denies increased dyspnea or fatigue.  He does not drink alcohol, no excessive caffeine, no tobacco, denies sleep apnea. Chadsvasc  Score of 3.  Today, he denies symptoms of palpitations, chest pain, shortness of breath, orthopnea, PND, lower extremity edema, dizziness, presyncope, syncope, or neurologic sequela. The patient is tolerating medications without difficulties and is otherwise without complaint today.   Past Medical History:  Diagnosis Date  . Arthritis   . Bilateral lower extremity edema    Chronic, with venous insufficiency  . Cataract    beginning  . Chronic kidney disease    kidney stones  . Complication of anesthesia    stayed awake during colonoscopy  . Diabetes mellitus without complication ([place])    "on medicine, but numbers have been good for years since weight loss"  . Dyslipidemia    Monitored by Primary Physician. On statin  . GERD (gastroesophageal reflux disease)   . Hypertension   . Knee pain    R>L  . Morbid obesity with BMI of 45.0-49.9, adult ([place])    Attempted weight loss efforts at last visit had BMI down to 42, unfortunately back up to 46 with  weight going up to 312 lb from 285 lb  . Pneumonia 20 years ago   h/o   Past Surgical History:  Procedure Laterality Date  . 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 Mild elevation in LVDP, EF 55%. --> False-positive stress test  . COLONOSCOPY    . CYSTOSCOPY WITH STENT PLACEMENT Left 04/19/2013   Procedure: CYSTOSCOPY WITH STENT PLACEMENT;  Surgeon: Alexis Frock, MD;  Location: [facility];  Service: Urology;  Laterality: Left;  RIGHT RETROGRADE PYELOGRAM  . CYSTOSCOPY WITH URETEROSCOPY Left 04/21/2013   Procedure: CYSTOSCOPY WITH URETEROSCOPY bilateral retrograde, bilateral stent change, stone extraction;  Surgeon: Alexis Frock, MD;  Location: [facility];  Service: Urology;  Laterality: Left;  bilateral ureters  . HAND SURGERY Bilateral 15 years ago  . HOLMIUM LASER APPLICATION Right 0/97/3532   Procedure: HOLMIUM LASER APPLICATION;  Surgeon: Alexis Frock, MD;  Location: [facility];  Service: Urology;  Laterality: Right;  . NEPHROLITHOTOMY Right 04/19/2013   Procedure: NEPHROLITHOTOMY PERCUTANEOUS;  Surgeon: Alexis Frock, MD;  Location: [facility];  Service: Urology;  Laterality: Right;  . NEPHROLITHOTOMY Right 04/21/2013   Procedure: NEPHROLITHOTOMY PERCUTANEOUS RIGHT 2ND STAGE PERCUTANEOUS NEPHROLITHOTOMY;  Surgeon: Alexis Frock, MD;  Location: [facility];  Service: Urology;  Laterality: Right;  . NM  MYOVIEW LTD; Persantine  May 2012   Suggested as high risk with 3 times a day 1.2 to, mild to moderate 3 times a day affecting basolateral and mid lateral walls.  Alex Bowman POLYPECTOMY      Current Outpatient Prescriptions  Medication Sig Dispense Refill  . atorvastatin (LIPITOR) 40 MG tablet Take 40 mg by mouth every morning.    . cholecalciferol (VITAMIN D) 1000 UNITS tablet Take 1,000 Units by mouth daily.    Alex Bowman HYDROcodone-acetaminophen (NORCO) 10-325 MG per tablet Take 1 tablet by mouth every 6 (six) hours as needed for pain. 30 tablet 0  . lisinopril  (PRINIVIL,ZESTRIL) 20 MG tablet TAKE 1 TABLET BY MOUTH TWICE DAILY    . metFORMIN (GLUCOPHAGE) 500 MG tablet Take 500 mg by mouth 2 (two) times daily with a meal.    . metoprolol succinate (TOPROL-XL) 50 MG 24 hr tablet Take 1 tablet (50 mg total) by mouth daily. TAKE 1 TABLET BY MOUTH EVERY DAY. 30 tablet 3  . Multiple Vitamin (MULTIVITAMIN WITH MINERALS) TABS tablet Take 1 tablet by mouth daily.    . phentermine 30 MG capsule 1 capsule daily.  0  . sertraline (ZOLOFT) 50 MG tablet 1 tablet daily.    . solifenacin (VESICARE) 5 MG tablet Take 5 mg by mouth daily.    Alex Bowman apixaban (ELIQUIS) 5 MG TABS tablet Take 1 tablet (5 mg total) by mouth 2 (two) times daily. 60 tablet 3  . hydrOXYzine (ATARAX/VISTARIL) 10 MG tablet 1 tablet daily as needed.     No current facility-administered medications for this encounter.     No Known Allergies  Social History   Social History  . Marital status: Married    Spouse name: N/A  . Number of children: N/A  . Years of education: N/A   Occupational History  . Not on file.   Social History Main Topics  . Smoking status: Former Smoker    Packs/day: 1.00    Years: 30.00    Types: Cigarettes    Quit date: 03/02/1997  . Smokeless tobacco: Former Systems developer    Types: Chew    Quit date: 08/12/2002  . Alcohol use No  . Drug use: No  . Sexual activity: Not on file   Other Topics Concern  . Not on file   Social History Narrative   He is a married father of 2, grandfather of one. Quit smoking over 15 years ago. Does not drink. Limited exercise due to knees and hip pain.    Family History  Problem Relation Age of Onset  . Breast cancer Mother   . Prostate cancer Father   . Colon cancer Neg Hx     ROS- All systems are reviewed and negative except as per the HPI above  Physical Exam: Vitals:   10/30/16 0946  BP: 132/84  Pulse: (!) 135  Weight: (!) 316 lb (143.3 kg)  Height: 5\' 10"  (1.778 m)   Wt Readings from Last 3 Encounters:  10/30/16 (!) 316  lb (143.3 kg)  10/24/16 292 lb (132.5 kg)  11/18/14 (!) 320 lb (145.2 kg)    Labs: Lab Results  Component Value Date   NA 138 10/24/2016   K 4.2 10/24/2016   CL 104 10/24/2016   CO2 25 10/24/2016   GLUCOSE 96 10/24/2016   BUN 25 (H) 10/24/2016   CREATININE 1.35 (H) 10/24/2016   CALCIUM 9.1 10/24/2016   No results found for: INR No results found for: CHOL, HDL, LDLCALC, TRIG  GEN- The patient is well appearing, alert and oriented x 3 today.   Head- normocephalic, atraumatic Eyes-  Sclera clear, conjunctiva pink Ears- hearing intact Oropharynx- clear Neck- supple, no JVP Lymph- no cervical lymphadenopathy Lungs- Clear to ausculation bilaterally, normal work of breathing Heart- Rapid irregular rate and rhythm, no murmurs, rubs or gallops, PMI not laterally displaced GI- soft, NT, ND, + BS Extremities- no clubbing, cyanosis, or edema MS- no significant deformity or atrophy Skin- no rash or lesion Psych- euthymic mood, full affect Neuro- strength and sensation are intact  EKG- afib at 135 bpm, qrs int 80 ms, qtc 429 ms Epic records reviewed   Assessment and Plan: 1. Persistent afib Fairly new onset Possibly phentermine is aggravating heart rhythm, pt is to see PCP soon and will bring this up, may be best to stop drug. Increase metoprolol to 50 mg qd for better rate control Chadsvasc score is at least 3, he says that he does not normally fall but is a fall risk I feel that it is reasonable to start a DOAC Pt denies a bleeding history Bleeding precautions reviewed Will start eliquis 5 mg bid and pt will stop ASA Echo  F/u in 2 weeks after echo , sooner if issues  Butch Penny C. Phyllistine Domingos, [facility] [address] [place], [place] [address] [phone]

## 2016-11-12 ENCOUNTER — Other Ambulatory Visit (HOSPITAL_COMMUNITY): Payer: Self-pay | Admitting: Nurse Practitioner

## 2016-11-12 ENCOUNTER — Other Ambulatory Visit: Payer: Self-pay

## 2016-11-12 ENCOUNTER — Ambulatory Visit (HOSPITAL_BASED_OUTPATIENT_CLINIC_OR_DEPARTMENT_OTHER)
Admission: RE | Admit: 2016-11-12 | Discharge: 2016-11-12 | Disposition: A | Discharge status: Home / Self Care | Payer: Medicare Other | Source: Ambulatory Visit | Attending: Nurse Practitioner | Admitting: Nurse Practitioner

## 2016-11-12 ENCOUNTER — Encounter (HOSPITAL_COMMUNITY): Payer: Self-pay | Admitting: Nurse Practitioner

## 2016-11-12 ENCOUNTER — Ambulatory Visit (HOSPITAL_COMMUNITY)
Admission: RE | Admit: 2016-11-12 | Discharge: 2016-11-12 | Disposition: A | Discharge status: Home / Self Care | Payer: Medicare Other | Source: Ambulatory Visit | Attending: Nurse Practitioner | Admitting: Nurse Practitioner

## 2016-11-12 VITALS — BP 128/76 | HR 113 | Ht 70.0 in | Wt 311.0 lb

## 2016-11-12 DIAGNOSIS — I872 Venous insufficiency (chronic) (peripheral): Secondary | ICD-10-CM | POA: Diagnosis not present

## 2016-11-12 DIAGNOSIS — I481 Persistent atrial fibrillation: Secondary | ICD-10-CM | POA: Insufficient documentation

## 2016-11-12 DIAGNOSIS — E1122 Type 2 diabetes mellitus with diabetic chronic kidney disease: Secondary | ICD-10-CM | POA: Insufficient documentation

## 2016-11-12 DIAGNOSIS — I129 Hypertensive chronic kidney disease with stage 1 through stage 4 chronic kidney disease, or unspecified chronic kidney disease: Secondary | ICD-10-CM | POA: Insufficient documentation

## 2016-11-12 DIAGNOSIS — E785 Hyperlipidemia, unspecified: Secondary | ICD-10-CM | POA: Diagnosis not present

## 2016-11-12 DIAGNOSIS — Z7901 Long term (current) use of anticoagulants: Secondary | ICD-10-CM | POA: Insufficient documentation

## 2016-11-12 DIAGNOSIS — Z87891 Personal history of nicotine dependence: Secondary | ICD-10-CM | POA: Insufficient documentation

## 2016-11-12 DIAGNOSIS — Z7984 Long term (current) use of oral hypoglycemic drugs: Secondary | ICD-10-CM | POA: Insufficient documentation

## 2016-11-12 DIAGNOSIS — K219 Gastro-esophageal reflux disease without esophagitis: Secondary | ICD-10-CM | POA: Insufficient documentation

## 2016-11-12 DIAGNOSIS — Z79899 Other long term (current) drug therapy: Secondary | ICD-10-CM | POA: Diagnosis not present

## 2016-11-12 DIAGNOSIS — Z6841 Body Mass Index (BMI) 40.0 and over, adult: Secondary | ICD-10-CM | POA: Diagnosis not present

## 2016-11-12 DIAGNOSIS — E119 Type 2 diabetes mellitus without complications: Secondary | ICD-10-CM | POA: Diagnosis not present

## 2016-11-12 DIAGNOSIS — I4819 Other persistent atrial fibrillation: Secondary | ICD-10-CM

## 2016-11-12 DIAGNOSIS — Z8042 Family history of malignant neoplasm of prostate: Secondary | ICD-10-CM | POA: Diagnosis not present

## 2016-11-12 DIAGNOSIS — Z853 Personal history of malignant neoplasm of breast: Secondary | ICD-10-CM | POA: Insufficient documentation

## 2016-11-12 DIAGNOSIS — N189 Chronic kidney disease, unspecified: Secondary | ICD-10-CM | POA: Insufficient documentation

## 2016-11-12 MED ORDER — METOPROLOL SUCCINATE ER 50 MG PO TB24: ORAL_TABLET | ORAL | 3 refills | Status: DC

## 2016-11-12 NOTE — Patient Instructions (Signed)
Your physician has recommended you make the following change in your medication:  1)Increase metoprolol to 50mg  in the morning and (1/2 tablet) 25mg  in the evening

## 2016-11-12 NOTE — Progress Notes (Signed)
  Echocardiogram 2D Echocardiogram has been performed.  Alex Bowman 11/12/2016, 1:58 PM

## 2016-11-12 NOTE — Progress Notes (Signed)
Primary Care Physician: Phineas Inches, MD Referring Physician:[facility] ER f/u   Alex Bowman is a 71 y.o. male with a h/o morbid obesity, DM, HTN, that presented to [facility] ER after 2 falls in one day. He is not sure why he fell but  felt that his legs would not support him. [name] He fell 2 days earlier on the ice. He does have issues with bad knees and walks with a walker. He is trying to lose weight to get below 300 lbs so he can have knee surgery. In fact, he was started on phentermine 1-2 months ago to help with weight lose. He has lost 22 lbs.  Weight 15 months ago, he was over 400 lbs.EKG in the ER showed Afib with v rate of 113 bpm.  He was  not started on anticoagulant in the ER due to falls but pt states that he does not normally fall.   He is not aware of irregular heart beat this am with afib at 135 bpm. He denies increased dyspnea or fatigue.  He does not drink alcohol, no excessive caffeine, no tobacco, denies sleep apnea. Chadsvasc  Score of 3.  F/u in afib clinic, just had echo but has not been read of as yet. He is doing well on eliquis no missed odese since started . He is still on phentermine, which could have contributed to afib, has not been back to PCP to discuss stopping this. His weight is down 5 more lbs but believes he will stop drug. He is still poorly rate controlled and will increase dose of BB.  Today, he denies symptoms of palpitations, chest pain, shortness of breath, orthopnea, PND, lower extremity edema, dizziness, presyncope, syncope, or neurologic sequela. The patient is tolerating medications without difficulties and is otherwise without complaint today.   Past Medical History:  Diagnosis Date  . Arthritis   . Bilateral lower extremity edema    Chronic, with venous insufficiency  . Cataract    beginning  . Chronic kidney disease    kidney stones  . Complication of anesthesia    stayed awake during colonoscopy  . Diabetes mellitus without complication ([place])    "on medicine, but numbers have been good for years since weight loss"  . Dyslipidemia    Monitored by Primary Physician. On statin  . GERD (gastroesophageal reflux disease)   . Hypertension   . Knee pain    R>L  . Morbid obesity with BMI of 45.0-49.9, adult ([place])    Attempted weight loss efforts at last visit had BMI down to 42, unfortunately back up to 46 with weight going up to 312 lb from 285 lb  . Pneumonia 20 years ago   h/o   Past Surgical History:  Procedure Laterality Date  . 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 Mild elevation in LVDP, EF 55%. --> False-positive stress test  . COLONOSCOPY    . CYSTOSCOPY WITH STENT PLACEMENT Left 04/19/2013   Procedure: CYSTOSCOPY WITH STENT PLACEMENT;  Surgeon: Alexis Frock, MD;  [facility];  Service: Urology;  Laterality: Left;  RIGHT RETROGRADE PYELOGRAM  . CYSTOSCOPY WITH URETEROSCOPY Left 04/21/2013   Procedure: CYSTOSCOPY WITH URETEROSCOPY bilateral retrograde, bilateral stent change, stone extraction;  Surgeon: Alexis Frock, MD;  [facility];  Service: Urology;  Laterality: Left;  bilateral ureters  . HAND SURGERY Bilateral 15 years ago  . HOLMIUM LASER APPLICATION Right 4/76/5465   Procedure: HOLMIUM LASER APPLICATION;  Surgeon: Alexis Frock, MD;  [facility];  Service: Urology;  Laterality: Right;  . NEPHROLITHOTOMY Right 04/19/2013   Procedure: NEPHROLITHOTOMY PERCUTANEOUS;  Surgeon: Alexis Frock, MD;  [facility];  Service: Urology;  Laterality: Right;  . NEPHROLITHOTOMY Right 04/21/2013   Procedure: NEPHROLITHOTOMY PERCUTANEOUS RIGHT 2ND STAGE PERCUTANEOUS NEPHROLITHOTOMY;  Surgeon: Alexis Frock, MD;  [facility];  Service: Urology;  Laterality: Right;  . NM MYOVIEW LTD; Persantine  May 2012   Suggested as high risk with 3 times a day 1.2 to, mild to moderate 3 times a day affecting basolateral and mid lateral walls.  [name] POLYPECTOMY      Current  Outpatient Prescriptions  Medication Sig Dispense Refill  . apixaban (ELIQUIS) 5 MG TABS tablet Take 1 tablet (5 mg total) by mouth 2 (two) times daily. 60 tablet 3  . atorvastatin (LIPITOR) 40 MG tablet Take 40 mg by mouth every morning.    . cholecalciferol (VITAMIN D) 1000 UNITS tablet Take 1,000 Units by mouth daily.    [name] HYDROcodone-acetaminophen (NORCO) 10-325 MG per tablet Take 1 tablet by mouth every 6 (six) hours as needed for pain. 30 tablet 0  . hydrOXYzine (ATARAX/VISTARIL) 10 MG tablet 1 tablet daily as needed.    [name] lisinopril (PRINIVIL,ZESTRIL) 20 MG tablet TAKE 1 TABLET BY MOUTH TWICE DAILY    . metFORMIN (GLUCOPHAGE) 500 MG tablet Take 500 mg by mouth 2 (two) times daily with a meal.    . metoprolol succinate (TOPROL-XL) 50 MG 24 hr tablet Take 1 tablet (50mg ) in the morning and 1/2 tablet (25mg ) in the evening 45 tablet 3  . Multiple Vitamin (MULTIVITAMIN WITH MINERALS) TABS tablet Take 1 tablet by mouth daily.    . phentermine 30 MG capsule 1 capsule daily.  0  . sertraline (ZOLOFT) 50 MG tablet 1 tablet daily.    . solifenacin (VESICARE) 5 MG tablet Take 5 mg by mouth daily.     No current facility-administered medications for this encounter.     No Known Allergies  Social History   Social History  . Marital status: Married    Spouse name: N/A  . Number of children: N/A  . Years of education: N/A   Occupational History  . Not on file.   Social History Main Topics  . Smoking status: Former Smoker    Packs/day: 1.00    Years: 30.00    Types: Cigarettes    Quit date: 03/02/1997  . Smokeless tobacco: Former Systems developer    Types: Chew    Quit date: 08/12/2002  . Alcohol use No  . Drug use: No  . Sexual activity: Not on file   Other Topics Concern  . Not on file   Social History Narrative   He is a married father of 2, grandfather of one. Quit smoking over 15 years ago. Does not drink. Limited exercise due to knees and hip pain.    Family History  Problem  Relation Age of Onset  . Breast cancer Mother   . Prostate cancer Father   . Colon cancer Neg Hx     ROS- All systems are reviewed and negative except as per the HPI above  Physical Exam: Vitals:   11/12/16 1416  BP: 128/76  Pulse: (!) 113  Weight: (!) 311 lb (141.1 kg)  Height: 5\' 10"  (1.778 m)   Wt Readings from Last 3 Encounters:  11/12/16 (!) 311 lb (141.1 kg)  10/30/16 (!) 316 lb (143.3 kg)  10/24/16 292 lb (  132.5 kg)    Labs: Lab Results  Component Value Date   NA 138 10/24/2016   K 4.2 10/24/2016   CL 104 10/24/2016   CO2 25 10/24/2016   GLUCOSE 96 10/24/2016   BUN 25 (H) 10/24/2016   CREATININE 1.35 (H) 10/24/2016   CALCIUM 9.1 10/24/2016   No results found for: INR No results found for: CHOL, HDL, LDLCALC, TRIG   GEN- The patient is well appearing, alert and oriented x 3 today.   Head- normocephalic, atraumatic Eyes-  Sclera clear, conjunctiva pink Ears- hearing intact Oropharynx- clear Neck- supple, no JVP Lymph- no cervical lymphadenopathy Lungs- Clear to ausculation bilaterally, normal work of breathing Heart- irregular rate and rhythm, no murmurs, rubs or gallops, PMI not laterally displaced GI- soft, NT, ND, + BS Extremities- no clubbing, cyanosis, or edema MS- no significant deformity or atrophy Skin- no rash or lesion Psych- euthymic mood, full affect Neuro- strength and sensation are intact  EKG- afib at 113 bpm, qrs int 80 ms, qtc 429 ms Epic records reviewed   Assessment and Plan: 1. Persistent afib Fairly new onset Possibly phentermine is aggravating heart rhythm, pt believes he will stop drug  prior to seeing PCP next week. Continue metoprolol at 50 mg am and add 1/2 tab in the pm. Chadsvasc score is at least 3, he states no further falls. He is doing well on eliquis 5 mg bid without bleeding issues Echo pending  F/u in  one week, will plan for cardioversion after on Doac x 3 weeks  Butch Penny C. Daritza Brees, [facility] [address] [place], [place] [address] [phone]

## 2016-11-13 LAB — ECHOCARDIOGRAM COMPLETE
AO mean calculated velocity dopler: 136 cm/s
AOASC: 35 cm
AV Area VTI: 1.59 cm2
AV Area mean vel: 1.6 cm2
AV Peak grad: 17 mmHg
AV VEL mean LVOT/AV: 0.46
AV peak Index: 0.63
AV pk vel: 206 cm/s
AVAREAMEANVIN: 0.63 cm2/m2
AVAREAVTIIND: 0.74 cm2/m2
AVG: 8 mmHg
AVLVOTPG: 4 mmHg
AVPHT: 346 ms
Ao pk vel: 0.46 m/s
CHL CUP AV VALUE AREA INDEX: 0.74
CHL CUP AV VEL: 1.88
CHL CUP DOP CALC LVOT VTI: 18.6 cm
CHL CUP MV DEC (S): 208
CHL CUP TV REG PEAK VELOCITY: 309 cm/s
E decel time: 208 msec
FS: 27 % — AB (ref 28–44)
IV/PV OW: 1.2
LA ID, A-P, ES: 48 mm
LA diam end sys: 48 mm
LA diam index: 1.89 cm/m2
LA vol A4C: 93.2 ml
LA vol: 77.4 mL
LAVOLIN: 30.5 mL/m2
LV PW d: 12.8 mm — AB (ref 0.6–1.1)
LVOT area: 3.46 cm2
LVOT diameter: 21 mm
LVOTPV: 94.9 cm/s
LVOTSV: 64 mL
LVOTVTI: 0.54 cm
Lateral S' vel: 8.49 cm/s
MV pk E vel: 120 m/s
MVPG: 6 mmHg
RV TAPSE: 14.6 mm
TRMAXVEL: 309 cm/s
VTI: 34.3 cm
Valve area: 1.88 cm2

## 2016-11-20 ENCOUNTER — Ambulatory Visit (HOSPITAL_COMMUNITY)
Admission: RE | Admit: 2016-11-20 | Discharge: 2016-11-20 | Disposition: A | Discharge status: Home / Self Care | Payer: Medicare Other | Source: Ambulatory Visit | Attending: Nurse Practitioner | Admitting: Nurse Practitioner

## 2016-11-20 ENCOUNTER — Encounter (HOSPITAL_COMMUNITY): Payer: Self-pay | Admitting: Nurse Practitioner

## 2016-11-20 VITALS — BP 128/78 | HR 121 | Ht 70.0 in | Wt 300.4 lb

## 2016-11-20 DIAGNOSIS — I129 Hypertensive chronic kidney disease with stage 1 through stage 4 chronic kidney disease, or unspecified chronic kidney disease: Secondary | ICD-10-CM | POA: Diagnosis not present

## 2016-11-20 DIAGNOSIS — M1990 Unspecified osteoarthritis, unspecified site: Secondary | ICD-10-CM | POA: Diagnosis not present

## 2016-11-20 DIAGNOSIS — Z7984 Long term (current) use of oral hypoglycemic drugs: Secondary | ICD-10-CM | POA: Diagnosis not present

## 2016-11-20 DIAGNOSIS — Z803 Family history of malignant neoplasm of breast: Secondary | ICD-10-CM | POA: Insufficient documentation

## 2016-11-20 DIAGNOSIS — Z87442 Personal history of urinary calculi: Secondary | ICD-10-CM | POA: Insufficient documentation

## 2016-11-20 DIAGNOSIS — Z7901 Long term (current) use of anticoagulants: Secondary | ICD-10-CM | POA: Insufficient documentation

## 2016-11-20 DIAGNOSIS — E785 Hyperlipidemia, unspecified: Secondary | ICD-10-CM | POA: Insufficient documentation

## 2016-11-20 DIAGNOSIS — I4819 Other persistent atrial fibrillation: Secondary | ICD-10-CM

## 2016-11-20 DIAGNOSIS — K219 Gastro-esophageal reflux disease without esophagitis: Secondary | ICD-10-CM | POA: Diagnosis not present

## 2016-11-20 DIAGNOSIS — Z87891 Personal history of nicotine dependence: Secondary | ICD-10-CM | POA: Insufficient documentation

## 2016-11-20 DIAGNOSIS — Z8042 Family history of malignant neoplasm of prostate: Secondary | ICD-10-CM | POA: Insufficient documentation

## 2016-11-20 DIAGNOSIS — I481 Persistent atrial fibrillation: Secondary | ICD-10-CM | POA: Insufficient documentation

## 2016-11-20 DIAGNOSIS — E1122 Type 2 diabetes mellitus with diabetic chronic kidney disease: Secondary | ICD-10-CM | POA: Insufficient documentation

## 2016-11-20 DIAGNOSIS — Z79899 Other long term (current) drug therapy: Secondary | ICD-10-CM | POA: Insufficient documentation

## 2016-11-20 DIAGNOSIS — N189 Chronic kidney disease, unspecified: Secondary | ICD-10-CM | POA: Diagnosis not present

## 2016-11-20 DIAGNOSIS — Z6841 Body Mass Index (BMI) 40.0 and over, adult: Secondary | ICD-10-CM | POA: Diagnosis not present

## 2016-11-20 DIAGNOSIS — I872 Venous insufficiency (chronic) (peripheral): Secondary | ICD-10-CM | POA: Diagnosis not present

## 2016-11-20 LAB — BASIC METABOLIC PANEL
ANION GAP: 11 (ref 5–15)
BUN: 24 mg/dL — AB (ref 6–20)
CO2: 28 mmol/L (ref 22–32)
Calcium: 9.5 mg/dL (ref 8.9–10.3)
Chloride: 102 mmol/L (ref 101–111)
Creatinine, Ser: 1.55 mg/dL — ABNORMAL HIGH (ref 0.61–1.24)
GFR, EST AFRICAN AMERICAN: 50 mL/min — AB (ref >60)
GFR, EST NON AFRICAN AMERICAN: 43 mL/min — AB (ref >60)
Glucose, Bld: 110 mg/dL — ABNORMAL HIGH (ref 65–99)
POTASSIUM: 4.5 mmol/L (ref 3.5–5.1)
Sodium: 141 mmol/L (ref 135–145)

## 2016-11-20 LAB — CBC
HCT: 38.7 % — ABNORMAL LOW (ref 39.0–52.0)
HEMOGLOBIN: 12.2 g/dL — AB (ref 13.0–17.0)
MCH: 29.2 pg (ref 26.0–34.0)
MCHC: 31.5 g/dL (ref 30.0–36.0)
MCV: 92.6 fL (ref 78.0–100.0)
Platelets: 220 10*3/uL (ref 150–400)
RBC: 4.18 MIL/uL — AB (ref 4.22–5.81)
RDW: 14.3 % (ref 11.5–15.5)
WBC: 6.5 10*3/uL (ref 4.0–10.5)

## 2016-11-20 MED ORDER — METOPROLOL SUCCINATE ER 50 MG PO TB24: 50.0000 mg | ORAL_TABLET | Freq: Two times a day (BID) | ORAL | 1 refills | Status: DC

## 2016-11-20 NOTE — Patient Instructions (Signed)
Your physician has recommended you make the following change in your medication: Increase your Metoprolol to 50 mg twice a day.  This Rx has been sent to your pharmacy.  Your cardioversion is scheduled for  April 18th.  Please be here at 12:30 the day of your procedure.  Arrive at the [facility] and go to admitting at 12:30 Do Not eat or drink anything after midnight the night prior to your procedure. Take all your medications with a sip of water prior to arrival. Do NOT miss any doses of your blood thinner. You will NOT be able to drive home after your procedure.

## 2016-11-20 NOTE — Progress Notes (Signed)
Primary Care Physician: Phineas Inches, MD Referring Physician:[facility] ER f/u   Alex Bowman is a 71 y.o. male with a h/o morbid obesity, DM, HTN, that presented to [facility] ER after 2 falls in one day. He is not sure why he fell but  felt that his legs would not support him. [name] He fell 2 days earlier on the ice. He does have issues with bad knees and walks with a walker. He is trying to lose weight to get below 300 lbs so he can have knee surgery. In fact, he was started on phentermine 1-2 months ago to help with weight lose. He has lost 22 lbs.  Weight 15 months ago, he was over 400 lbs.EKG in the ER showed Afib with v rate of 113 bpm. He says that he had some issues with afib within the last year and saw a MD in [facility] and when he went back for f/u he was in [place], so he never started anticoagulant and did not have any further issues with afib.  He was  not started on anticoagulant in the ER due to falls but pt states that he does not normally fall.   He is not aware of irregular heart beat this am with afib at 135 bpm. He denies increased dyspnea or fatigue.  He does not drink alcohol, no excessive caffeine, no tobacco, denies sleep apnea. Chadsvasc  Score of 3.  F/u in afib clinic, just had echo but has not been read of as yet. He is doing well on eliquis no missed odese since started . He is still on phentermine, which could have contributed to afib, has not been back to PCP to discuss stopping this. His weight is down 5 more lbs but believes he will stop drug. He is still poorly rate controlled and will increase dose of BB.  He returns to afib clinic for one week f/u. He remains in afib with rvr. He has been off phentermine x one week but has lost an additional 11 lbs. He is hoping to get his knees done in the near future so he can ambulate better and was told if he got under 300 lbs, he would be a better surgical candidate. He can now be scheduled for cardioversion as he is close to being on  anticoagulation x 3 weeks, first full day 3/23.Marland KitchenHe has not had any further dizziness, falls.Echo reviewed.  Today, he denies symptoms of palpitations, chest pain, shortness of breath, orthopnea, PND, lower extremity edema, dizziness, presyncope, syncope, or neurologic sequela. The patient is tolerating medications without difficulties and is otherwise without complaint today.   Past Medical History:  Diagnosis Date  . Arthritis   . Bilateral lower extremity edema    Chronic, with venous insufficiency  . Cataract    beginning  . Chronic kidney disease    kidney stones  . Complication of anesthesia    stayed awake during colonoscopy  . Diabetes mellitus without complication ([place])    "on medicine, but numbers have been good for years since weight loss"  . Dyslipidemia    Monitored by Primary Physician. On statin  . GERD (gastroesophageal reflux disease)   . Hypertension   . Knee pain    R>L  . Morbid obesity with BMI of 45.0-49.9, adult ([place])    Attempted weight loss efforts at last visit had BMI down to 42, unfortunately back up to 46 with weight going up to 312 lb from 285 lb  . Pneumonia  20 years ago   h/o   Past Surgical History:  Procedure Laterality Date  . 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 Mild elevation in LVDP, EF 55%. --> False-positive stress test  . COLONOSCOPY    . CYSTOSCOPY WITH STENT PLACEMENT Left 04/19/2013   Procedure: CYSTOSCOPY WITH STENT PLACEMENT;  Surgeon: Alexis Frock, MD;  Location: [facility];  Service: Urology;  Laterality: Left;  RIGHT RETROGRADE PYELOGRAM  . CYSTOSCOPY WITH URETEROSCOPY Left 04/21/2013   Procedure: CYSTOSCOPY WITH URETEROSCOPY bilateral retrograde, bilateral stent change, stone extraction;  Surgeon: Alexis Frock, MD;  [facility]: [facility];  Service: Urology;  Laterality: Left;  bilateral ureters  . HAND SURGERY Bilateral 15 years ago  . HOLMIUM LASER APPLICATION Right 03/23/7516   Procedure:  HOLMIUM LASER APPLICATION;  Surgeon: Alexis Frock, MD;  [facility];  Service: Urology;  Laterality: Right;  . NEPHROLITHOTOMY Right 04/19/2013   Procedure: NEPHROLITHOTOMY PERCUTANEOUS;  Surgeon: Alexis Frock, MD;  [facility];  Service: Urology;  Laterality: Right;  . NEPHROLITHOTOMY Right 04/21/2013   Procedure: NEPHROLITHOTOMY PERCUTANEOUS RIGHT 2ND STAGE PERCUTANEOUS NEPHROLITHOTOMY;  Surgeon: Alexis Frock, MD;  [facility];  Service: Urology;  Laterality: Right;  . NM MYOVIEW LTD; Persantine  May 2012   Suggested as high risk with 3 times a day 1.2 to, mild to moderate 3 times a day affecting basolateral and mid lateral walls.  [name] POLYPECTOMY      Current Outpatient Prescriptions  Medication Sig Dispense Refill  . apixaban (ELIQUIS) 5 MG TABS tablet Take 1 tablet (5 mg total) by mouth 2 (two) times daily. 60 tablet 3  . atorvastatin (LIPITOR) 40 MG tablet Take 40 mg by mouth every morning.    . cholecalciferol (VITAMIN D) 1000 UNITS tablet Take 1,000 Units by mouth daily.    [name] HYDROcodone-acetaminophen (NORCO) 10-325 MG per tablet Take 1 tablet by mouth every 6 (six) hours as needed for pain. 30 tablet 0  . hydrOXYzine (ATARAX/VISTARIL) 10 MG tablet 1 tablet daily as needed.    [name] lisinopril (PRINIVIL,ZESTRIL) 20 MG tablet TAKE 1 TABLET BY MOUTH TWICE DAILY    . metFORMIN (GLUCOPHAGE) 500 MG tablet Take 500 mg by mouth 2 (two) times daily with a meal.    . metoprolol succinate (TOPROL-XL) 50 MG 24 hr tablet Take 1 tablet (50 mg total) by mouth 2 (two) times daily. Take with or immediately following a meal. 180 tablet 1  . Multiple Vitamin (MULTIVITAMIN WITH MINERALS) TABS tablet Take 1 tablet by mouth daily.    . sertraline (ZOLOFT) 50 MG tablet 1 tablet daily.    . solifenacin (VESICARE) 5 MG tablet Take 5 mg by mouth daily.     No current facility-administered medications for this encounter.     No Known Allergies  Social History   Social History  . Marital  status: Married    Spouse name: N/A  . Number of children: N/A  . Years of education: N/A   Occupational History  . Not on file.   Social History Main Topics  . Smoking status: Former Smoker    Packs/day: 1.00    Years: 30.00    Types: Cigarettes    Quit date: 03/02/1997  . Smokeless tobacco: Former Systems developer    Types: Chew    Quit date: 08/12/2002  . Alcohol use No  . Drug use: No  . Sexual activity: Not on file   Other Topics Concern  . Not on  file   Social History Narrative   He is a married father of 2, grandfather of one. Quit smoking over 15 years ago. Does not drink. Limited exercise due to knees and hip pain.    Family History  Problem Relation Age of Onset  . Breast cancer Mother   . Prostate cancer Father   . Colon cancer Neg Hx     ROS- All systems are reviewed and negative except as per the HPI above  Physical Exam: Vitals:   11/20/16 1130  BP: 128/78  Pulse: (!) 121  Weight: (!) 300 lb 6.4 oz (136.3 kg)  Height: 5\' 10"  (1.778 m)   Wt Readings from Last 3 Encounters:  11/20/16 (!) 300 lb 6.4 oz (136.3 kg)  11/12/16 (!) 311 lb (141.1 kg)  10/30/16 (!) 316 lb (143.3 kg)    Labs: Lab Results  Component Value Date   NA 141 11/20/2016   K 4.5 11/20/2016   CL 102 11/20/2016   CO2 28 11/20/2016   GLUCOSE 110 (H) 11/20/2016   BUN 24 (H) 11/20/2016   CREATININE 1.55 (H) 11/20/2016   CALCIUM 9.5 11/20/2016   No results found for: INR No results found for: CHOL, HDL, LDLCALC, TRIG   GEN- The patient is well appearing, alert and oriented x 3 today.   Head- normocephalic, atraumatic Eyes-  Sclera clear, conjunctiva pink Ears- hearing intact Oropharynx- clear Neck- supple, no JVP Lymph- no cervical lymphadenopathy Lungs- Clear to ausculation bilaterally, normal work of breathing Heart- irregular rate and rhythm, no murmurs, rubs or gallops, PMI not laterally displaced GI- soft, NT, ND, + BS Extremities- no clubbing, cyanosis, or edema MS- no  significant deformity or atrophy Skin- no rash or lesion Psych- euthymic mood, full affect Neuro- strength and sensation are intact  EKG- afib at 113 bpm, qrs int 80 ms, qtc 429 ms Epic records reviewed Echo-Study Conclusions  - Procedure narrative: Transthoracic echocardiography. Image   quality was poor. The study was technically difficult, as a   result of poor sound wave transmission and body habitus. - Left ventricle: The cavity size was normal. Wall thickness was   increased in a pattern of moderate LVH. Systolic function was   normal. The estimated ejection fraction was in the range of 50%   to 55%. Images were inadequate for LV wall motion assessment. The   study is not technically sufficient to allow evaluation of LV   diastolic function. - Aortic valve: Poorly visualized. Mildly calcified leaflets. Mild   stenosis, moderate regurgitation. Mean gradient (S): 8 mm Hg.   Peak gradient (S): 17 mm Hg. Valve area (VTI): 1.88 cm^2. Valve   area (Vmax): 1.59 cm^2. Valve area (Vmean): 1.6 cm^2.   Regurgitation pressure half-time: 346 ms. - Mitral valve: Calcified annulus. Mildly thickened leaflets .   There was mild regurgitation. - Left atrium: The atrium was mildly dilated. - Right ventricle: The cavity size was moderately dilated. Systolic   function is mildly reduced. - Right atrium: The atrium was mildly dilated. - Tricuspid valve: There was mild regurgitation. - Pulmonary arteries: PA peak pressure: 53 mm Hg (S). - Inferior vena cava: The vessel was dilated. The respirophasic   diameter changes were blunted (< 50%), consistent with elevated   central venous pressure.  Impressions:  - Technically difficult study with poor echo windows. LVEF around   50-55%, moderate LVH, calcified aortic valve with mild stenosis   and mild to moderate regurgitation, mild MR, mild biatrial   enlargement, moderate  RVE with reduced RV systolic function, mild   TR, RVSP 53 mmHg, dilated  IVC.   Assessment and Plan: 1. Persistent afib Fairly new onset Off phentermine with an additional 11 lbs lost in one week.  Chadsvasc score is at least 3, he states no further falls. He is doing well on eliquis 5 mg bid without bleeding issues, will have been drug x 3 weeks 4/13 and will schedule cardioversion 4/18, states no missed doses Risk vrs benefit of procedure explained Increase metoprolol ER to 50 mg bid for better rate control Cbc/bmet today Will check TSH on f/u   F/u in  one week after cardioversion  Butch Penny C. Simon Llamas, [facility] [address] [place], [place] [address] [phone]

## 2016-11-21 ENCOUNTER — Other Ambulatory Visit (HOSPITAL_COMMUNITY): Payer: Self-pay | Admitting: *Deleted

## 2016-11-27 ENCOUNTER — Ambulatory Visit (HOSPITAL_COMMUNITY): Payer: Medicare Other | Admitting: Certified Registered Nurse Anesthetist

## 2016-11-27 ENCOUNTER — Encounter (HOSPITAL_COMMUNITY)
Admission: RE | Disposition: A | Discharge status: Home / Self Care | Payer: Self-pay | Source: Ambulatory Visit | Attending: Cardiovascular Disease

## 2016-11-27 ENCOUNTER — Ambulatory Visit (HOSPITAL_COMMUNITY)
Admission: RE | Admit: 2016-11-27 | Discharge: 2016-11-27 | Disposition: A | Discharge status: Home / Self Care | Payer: Medicare Other | Source: Ambulatory Visit | Attending: Cardiovascular Disease | Admitting: Cardiovascular Disease

## 2016-11-27 ENCOUNTER — Encounter (HOSPITAL_COMMUNITY): Payer: Self-pay

## 2016-11-27 DIAGNOSIS — I872 Venous insufficiency (chronic) (peripheral): Secondary | ICD-10-CM | POA: Diagnosis not present

## 2016-11-27 DIAGNOSIS — I481 Persistent atrial fibrillation: Secondary | ICD-10-CM | POA: Insufficient documentation

## 2016-11-27 DIAGNOSIS — M1990 Unspecified osteoarthritis, unspecified site: Secondary | ICD-10-CM | POA: Insufficient documentation

## 2016-11-27 DIAGNOSIS — E785 Hyperlipidemia, unspecified: Secondary | ICD-10-CM | POA: Diagnosis not present

## 2016-11-27 DIAGNOSIS — I129 Hypertensive chronic kidney disease with stage 1 through stage 4 chronic kidney disease, or unspecified chronic kidney disease: Secondary | ICD-10-CM | POA: Diagnosis not present

## 2016-11-27 DIAGNOSIS — I2510 Atherosclerotic heart disease of native coronary artery without angina pectoris: Secondary | ICD-10-CM | POA: Insufficient documentation

## 2016-11-27 DIAGNOSIS — I4819 Other persistent atrial fibrillation: Secondary | ICD-10-CM

## 2016-11-27 DIAGNOSIS — K219 Gastro-esophageal reflux disease without esophagitis: Secondary | ICD-10-CM | POA: Diagnosis not present

## 2016-11-27 DIAGNOSIS — Z6841 Body Mass Index (BMI) 40.0 and over, adult: Secondary | ICD-10-CM | POA: Insufficient documentation

## 2016-11-27 DIAGNOSIS — Z87891 Personal history of nicotine dependence: Secondary | ICD-10-CM | POA: Insufficient documentation

## 2016-11-27 DIAGNOSIS — Z7984 Long term (current) use of oral hypoglycemic drugs: Secondary | ICD-10-CM | POA: Insufficient documentation

## 2016-11-27 DIAGNOSIS — N189 Chronic kidney disease, unspecified: Secondary | ICD-10-CM | POA: Insufficient documentation

## 2016-11-27 DIAGNOSIS — E1122 Type 2 diabetes mellitus with diabetic chronic kidney disease: Secondary | ICD-10-CM | POA: Diagnosis not present

## 2016-11-27 DIAGNOSIS — Z7901 Long term (current) use of anticoagulants: Secondary | ICD-10-CM | POA: Insufficient documentation

## 2016-11-27 HISTORY — PX: CARDIOVERSION: SHX1299

## 2016-11-27 SURGERY — CARDIOVERSION
Anesthesia: General

## 2016-11-27 MED ORDER — LIDOCAINE HCL (CARDIAC) 20 MG/ML IV SOLN
INTRAVENOUS | Status: DC | PRN
  Administered 2016-11-27: 5 mL via INTRATRACHEAL

## 2016-11-27 MED ORDER — PROPOFOL 10 MG/ML IV BOLUS
INTRAVENOUS | Status: DC | PRN
  Administered 2016-11-27: 100 mg via INTRAVENOUS

## 2016-11-27 NOTE — Discharge Instructions (Signed)
Electrical Cardioversion, Care After °This sheet gives you information about how to care for yourself after your procedure. Your health care provider may also give you more specific instructions. If you have problems or questions, contact your health care provider. °What can I expect after the procedure? °After the procedure, it is common to have: °· Some redness on the skin where the shocks were given. °Follow these instructions at home: °· Do not drive for 24 hours if you were given a medicine to help you relax (sedative). °· Take over-the-counter and prescription medicines only as told by your health care provider. °· Ask your health care provider how to check your pulse. Check it often. °· Rest for 48 hours after the procedure or as told by your health care provider. °· Avoid or limit your caffeine use as told by your health care provider. °Contact a health care provider if: °· You feel like your heart is beating too quickly or your pulse is not regular. °· You have a serious muscle cramp that does not go away. °Get help right away if: °· You have discomfort in your chest. °· You are dizzy or you feel faint. °· You have trouble breathing or you are short of breath. °· Your speech is slurred. °· You have trouble moving an arm or leg on one side of your body. °· Your fingers or toes turn cold or blue. °This information is not intended to replace advice given to you by your health care provider. Make sure you discuss any questions you have with your health care provider. °Document Released: 05/19/2013 Document Revised: 03/01/2016 Document Reviewed: 02/02/2016 °Elsevier Interactive Patient Education © 2017 Elsevier Inc. ° °

## 2016-11-27 NOTE — Interval H&P Note (Signed)
History and Physical Interval Note:  11/27/2016 1:00 PM  Alex Bowman  has presented today for surgery, with the diagnosis of afib  The various methods of treatment have been discussed with the patient and family. After consideration of risks, benefits and other options for treatment, the patient has consented to  Procedure(s): CARDIOVERSION (N/A) as a surgical intervention .  The patient's history has been reviewed, patient examined, no change in status, stable for surgery.  I have reviewed the patient's chart and labs.  Questions were answered to the patient's satisfaction.     Skeet Latch, MD

## 2016-11-27 NOTE — H&P (View-Only) (Signed)
Primary Care Physician: Phineas Inches, MD Referring Physician:[facility] ER f/u   Alex Bowman is a 71 y.o. male with a h/o morbid obesity, DM, HTN, that presented to [facility] ER after 2 falls in one day. He is not sure why he fell but  felt that his legs would not support him. [name] He fell 2 days earlier on the ice. He does have issues with bad knees and walks with a walker. He is trying to lose weight to get below 300 lbs so he can have knee surgery. In fact, he was started on phentermine 1-2 months ago to help with weight lose. He has lost 22 lbs.  Weight 15 months ago, he was over 400 lbs.EKG in the ER showed Afib with v rate of 113 bpm. He says that he had some issues with afib within the last year and saw a MD in [facility] and when he went back for f/u he was in [place], so he never started anticoagulant and did not have any further issues with afib.  He was  not started on anticoagulant in the ER due to falls but pt states that he does not normally fall.   He is not aware of irregular heart beat this am with afib at 135 bpm. He denies increased dyspnea or fatigue.  He does not drink alcohol, no excessive caffeine, no tobacco, denies sleep apnea. Chadsvasc  Score of 3.  F/u in afib clinic, just had echo but has not been read of as yet. He is doing well on eliquis no missed odese since started . He is still on phentermine, which could have contributed to afib, has not been back to PCP to discuss stopping this. His weight is down 5 more lbs but believes he will stop drug. He is still poorly rate controlled and will increase dose of BB.  He returns to afib clinic for one week f/u. He remains in afib with rvr. He has been off phentermine x one week but has lost an additional 11 lbs. He is hoping to get his knees done in the near future so he can ambulate better and was told if he got under 300 lbs, he would be a better surgical candidate. He can now be scheduled for cardioversion as he is close to being on  anticoagulation x 3 weeks, first full day 3/23.Marland KitchenHe has not had any further dizziness, falls.Echo reviewed.  Today, he denies symptoms of palpitations, chest pain, shortness of breath, orthopnea, PND, lower extremity edema, dizziness, presyncope, syncope, or neurologic sequela. The patient is tolerating medications without difficulties and is otherwise without complaint today.   Past Medical History:  Diagnosis Date  . Arthritis   . Bilateral lower extremity edema    Chronic, with venous insufficiency  . Cataract    beginning  . Chronic kidney disease    kidney stones  . Complication of anesthesia    stayed awake during colonoscopy  . Diabetes mellitus without complication ([place])    "on medicine, but numbers have been good for years since weight loss"  . Dyslipidemia    Monitored by Primary Physician. On statin  . GERD (gastroesophageal reflux disease)   . Hypertension   . Knee pain    R>L  . Morbid obesity with BMI of 45.0-49.9, adult ([place])    Attempted weight loss efforts at last visit had BMI down to 42, unfortunately back up to 46 with weight going up to 312 lb from 285 lb  . Pneumonia  20 years ago   h/o   Past Surgical History:  Procedure Laterality Date  . 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 Mild elevation in LVDP, EF 55%. --> False-positive stress test  . COLONOSCOPY    . CYSTOSCOPY WITH STENT PLACEMENT Left 04/19/2013   Procedure: CYSTOSCOPY WITH STENT PLACEMENT;  Surgeon: Alexis Frock, MD;  [facility];  Service: Urology;  Laterality: Left;  RIGHT RETROGRADE PYELOGRAM  . CYSTOSCOPY WITH URETEROSCOPY Left 04/21/2013   Procedure: CYSTOSCOPY WITH URETEROSCOPY bilateral retrograde, bilateral stent change, stone extraction;  Surgeon: Alexis Frock, MD;  [facility];  Service: Urology;  Laterality: Left;  bilateral ureters  . HAND SURGERY Bilateral 15 years ago  . HOLMIUM LASER APPLICATION Right 02/09/1600   Procedure:  HOLMIUM LASER APPLICATION;  Surgeon: Alexis Frock, MD;  [facility];  Service: Urology;  Laterality: Right;  . NEPHROLITHOTOMY Right 04/19/2013   Procedure: NEPHROLITHOTOMY PERCUTANEOUS;  Surgeon: Alexis Frock, MD;  [facility];  Service: Urology;  Laterality: Right;  . NEPHROLITHOTOMY Right 04/21/2013   Procedure: NEPHROLITHOTOMY PERCUTANEOUS RIGHT 2ND STAGE PERCUTANEOUS NEPHROLITHOTOMY;  Surgeon: Alexis Frock, MD;  [facility];  Service: Urology;  Laterality: Right;  . NM MYOVIEW LTD; Persantine  May 2012   Suggested as high risk with 3 times a day 1.2 to, mild to moderate 3 times a day affecting basolateral and mid lateral walls.  [name] POLYPECTOMY      Current Outpatient Prescriptions  Medication Sig Dispense Refill  . apixaban (ELIQUIS) 5 MG TABS tablet Take 1 tablet (5 mg total) by mouth 2 (two) times daily. 60 tablet 3  . atorvastatin (LIPITOR) 40 MG tablet Take 40 mg by mouth every morning.    . cholecalciferol (VITAMIN D) 1000 UNITS tablet Take 1,000 Units by mouth daily.    [name] HYDROcodone-acetaminophen (NORCO) 10-325 MG per tablet Take 1 tablet by mouth every 6 (six) hours as needed for pain. 30 tablet 0  . hydrOXYzine (ATARAX/VISTARIL) 10 MG tablet 1 tablet daily as needed.    [name] lisinopril (PRINIVIL,ZESTRIL) 20 MG tablet TAKE 1 TABLET BY MOUTH TWICE DAILY    . metFORMIN (GLUCOPHAGE) 500 MG tablet Take 500 mg by mouth 2 (two) times daily with a meal.    . metoprolol succinate (TOPROL-XL) 50 MG 24 hr tablet Take 1 tablet (50 mg total) by mouth 2 (two) times daily. Take with or immediately following a meal. 180 tablet 1  . Multiple Vitamin (MULTIVITAMIN WITH MINERALS) TABS tablet Take 1 tablet by mouth daily.    . sertraline (ZOLOFT) 50 MG tablet 1 tablet daily.    . solifenacin (VESICARE) 5 MG tablet Take 5 mg by mouth daily.     No current facility-administered medications for this encounter.     No Known Allergies  Social History   Social History  . Marital  status: Married    Spouse name: N/A  . Number of children: N/A  . Years of education: N/A   Occupational History  . Not on file.   Social History Main Topics  . Smoking status: Former Smoker    Packs/day: 1.00    Years: 30.00    Types: Cigarettes    Quit date: 03/02/1997  . Smokeless tobacco: Former Systems developer    Types: Chew    Quit date: 08/12/2002  . Alcohol use No  . Drug use: No  . Sexual activity: Not on file   Other Topics Concern  . Not on  file   Social History Narrative   He is a married father of 2, grandfather of one. Quit smoking over 15 years ago. Does not drink. Limited exercise due to knees and hip pain.    Family History  Problem Relation Age of Onset  . Breast cancer Mother   . Prostate cancer Father   . Colon cancer Neg Hx     ROS- All systems are reviewed and negative except as per the HPI above  Physical Exam: Vitals:   11/20/16 1130  BP: 128/78  Pulse: (!) 121  Weight: (!) 300 lb 6.4 oz (136.3 kg)  Height: 5\' 10"  (1.778 m)   Wt Readings from Last 3 Encounters:  11/20/16 (!) 300 lb 6.4 oz (136.3 kg)  11/12/16 (!) 311 lb (141.1 kg)  10/30/16 (!) 316 lb (143.3 kg)    Labs: Lab Results  Component Value Date   NA 141 11/20/2016   K 4.5 11/20/2016   CL 102 11/20/2016   CO2 28 11/20/2016   GLUCOSE 110 (H) 11/20/2016   BUN 24 (H) 11/20/2016   CREATININE 1.55 (H) 11/20/2016   CALCIUM 9.5 11/20/2016   No results found for: INR No results found for: CHOL, HDL, LDLCALC, TRIG   GEN- The patient is well appearing, alert and oriented x 3 today.   Head- normocephalic, atraumatic Eyes-  Sclera clear, conjunctiva pink Ears- hearing intact Oropharynx- clear Neck- supple, no JVP Lymph- no cervical lymphadenopathy Lungs- Clear to ausculation bilaterally, normal work of breathing Heart- irregular rate and rhythm, no murmurs, rubs or gallops, PMI not laterally displaced GI- soft, NT, ND, + BS Extremities- no clubbing, cyanosis, or edema MS- no  significant deformity or atrophy Skin- no rash or lesion Psych- euthymic mood, full affect Neuro- strength and sensation are intact  EKG- afib at 113 bpm, qrs int 80 ms, qtc 429 ms Epic records reviewed Echo-Study Conclusions  - Procedure narrative: Transthoracic echocardiography. Image   quality was poor. The study was technically difficult, as a   result of poor sound wave transmission and body habitus. - Left ventricle: The cavity size was normal. Wall thickness was   increased in a pattern of moderate LVH. Systolic function was   normal. The estimated ejection fraction was in the range of 50%   to 55%. Images were inadequate for LV wall motion assessment. The   study is not technically sufficient to allow evaluation of LV   diastolic function. - Aortic valve: Poorly visualized. Mildly calcified leaflets. Mild   stenosis, moderate regurgitation. Mean gradient (S): 8 mm Hg.   Peak gradient (S): 17 mm Hg. Valve area (VTI): 1.88 cm^2. Valve   area (Vmax): 1.59 cm^2. Valve area (Vmean): 1.6 cm^2.   Regurgitation pressure half-time: 346 ms. - Mitral valve: Calcified annulus. Mildly thickened leaflets .   There was mild regurgitation. - Left atrium: The atrium was mildly dilated. - Right ventricle: The cavity size was moderately dilated. Systolic   function is mildly reduced. - Right atrium: The atrium was mildly dilated. - Tricuspid valve: There was mild regurgitation. - Pulmonary arteries: PA peak pressure: 53 mm Hg (S). - Inferior vena cava: The vessel was dilated. The respirophasic   diameter changes were blunted (< 50%), consistent with elevated   central venous pressure.  Impressions:  - Technically difficult study with poor echo windows. LVEF around   50-55%, moderate LVH, calcified aortic valve with mild stenosis   and mild to moderate regurgitation, mild MR, mild biatrial   enlargement, moderate  RVE with reduced RV systolic function, mild   TR, RVSP 53 mmHg, dilated  IVC.   Assessment and Plan: 1. Persistent afib Fairly new onset Off phentermine with an additional 11 lbs lost in one week.  Chadsvasc score is at least 3, he states no further falls. He is doing well on eliquis 5 mg bid without bleeding issues, will have been drug x 3 weeks 4/13 and will schedule cardioversion 4/18, states no missed doses Risk vrs benefit of procedure explained Increase metoprolol ER to 50 mg bid for better rate control Cbc/bmet today Will check TSH on f/u   F/u in  one week after cardioversion  Butch Penny C. Vashti Bolanos, [facility] [address] [place], [place] [address] [phone]

## 2016-11-27 NOTE — Anesthesia Preprocedure Evaluation (Signed)
Anesthesia Evaluation  Patient identified by MRN, date of birth, ID band Patient awake    Reviewed: Allergy & Precautions, H&P , NPO status , Patient's Chart, lab work & pertinent test results  Airway Mallampati: II  TM Distance: >3 FB Neck ROM: Full    Dental no notable dental hx.    Pulmonary sleep apnea , pneumonia, resolved, former smoker,  CXR: NAD   Pulmonary exam normal breath sounds clear to auscultation       Cardiovascular hypertension, Pt. on medications and Pt. on home beta blockers negative cardio ROS Normal cardiovascular exam Rhythm:Regular Rate:Normal  Echo 11/12/16 - Procedure narrative: Transthoracic echocardiography. Image quality was poor. The study was technically difficult, as a result of poor sound wave transmission and body habitus. - Left ventricle: The cavity size was normal. Wall thickness was increased in a pattern of moderate LVH. Systolic function was normal. The estimated ejection fraction was in the range of 50% to 55%. Images were inadequate for LV wall motion assessment. The study is not technically sufficient to allow evaluation of LV diastolic function. - Aortic valve: Poorly visualized. Mildly calcified leaflets. Mild stenosis, moderate regurgitation. Mean gradient (S): 8 mm Hg. Peak gradient (S): 17 mm Hg. Valve area (VTI): 1.88 cm^2. Valve area (Vmax): 1.59 cm^2. Valve area (Vmean): 1.6 cm^2. Regurgitation pressure half-time: 346 ms. - Mitral valve: Calcified annulus. Mildly thickened leaflets .   There was mild regurgitation. - Left atrium: The atrium was mildly dilated. - Right ventricle: The cavity size was moderately dilated. Systolic function is mildly reduced. - Right atrium: The atrium was mildly dilated. - Tricuspid valve: There was mild regurgitation. - Pulmonary arteries: PA peak pressure: 53 mm Hg (S). - Inferior vena cava: The vessel was dilated. The respirophasic diameter changes were  blunted (< 50%), consistent with elevated central venous pressure.  Impressions: - Technically difficult study with poor echo windows. LVEF around 50-55%, moderate LVH, calcified aortic valve with mild stenosis and mild to moderate regurgitation, mild MR, mild biatrial enlargement, moderate RVE with reduced RV systolic function, mild TR, RVSP 53 mmHg, dilated IVC.   Neuro/Psych negative neurological ROS  negative psych ROS   GI/Hepatic Neg liver ROS, GERD  ,  Endo/Other  diabetes, Type 2, Oral Hypoglycemic AgentsMorbid obesity  Renal/GU negative Renal ROS  negative genitourinary   Musculoskeletal negative musculoskeletal ROS (+)   Abdominal (+) + obese,   Peds negative pediatric ROS (+)  Hematology negative hematology ROS (+)   Anesthesia Other Findings   Reproductive/Obstetrics negative OB ROS                            Anesthesia Physical  Anesthesia Plan  ASA: III  Anesthesia Plan: General   Post-op Pain Management:    Induction: Intravenous  Airway Management Planned: Mask  Additional Equipment:   Intra-op Plan:   Post-operative Plan:   Informed Consent: I have reviewed the patients History and Physical, chart, labs and discussed the procedure including the risks, benefits and alternatives for the proposed anesthesia with the patient or authorized representative who has indicated his/her understanding and acceptance.   Dental advisory given  Plan Discussed with: CRNA  Anesthesia Plan Comments:         Anesthesia Quick Evaluation

## 2016-11-27 NOTE — CV Procedure (Signed)
Electrical Cardioversion Procedure Note Alex Bowman [id number] [birth year]  Procedure: Electrical Cardioversion Indications:  Atrial Fibrillation  Procedure Details Consent: Risks of procedure as well as the alternatives and risks of each were explained to the (patient/caregiver).  Consent for procedure obtained. Time Out: Verified patient identification, verified procedure, site/side was marked, verified correct patient position, special equipment/implants available, medications/allergies/relevent history reviewed, required imaging and test results available.  Performed  Patient placed on cardiac monitor, pulse oximetry, supplemental oxygen as necessary.  Sedation given: Propofol 100 mg Pacer pads placed anterior and posterior chest.  Cardioverted 1 time(s).  Cardioverted at 150J.  Evaluation Findings: Post procedure EKG shows: Sinus rhythm with frequent PACs Complications: None Patient did tolerate procedure well.   Skeet Latch, MD 11/27/2016, 1:44 PM

## 2016-11-27 NOTE — Transfer of Care (Signed)
Immediate Anesthesia Transfer of Care Note  Patient: Alex Bowman  Procedure(s) Performed: Procedure(s): CARDIOVERSION (N/A)  Patient Location: Endoscopy Unit  Anesthesia Type:General  Level of Consciousness: awake and alert   Airway & Oxygen Therapy: Patient Spontanous Breathing  Post-op Assessment: Report given to RN and Post -op Vital signs reviewed and stable  Post vital signs: Reviewed and stable  Last Vitals:  Vitals:   11/27/16 1223  BP: 133/72  Pulse: (!) 118  Resp: 20    Last Pain:  Vitals:   11/27/16 1223  TempSrc: Oral         Complications: No apparent anesthesia complications

## 2016-11-27 NOTE — Anesthesia Postprocedure Evaluation (Signed)
Anesthesia Post Note  Patient: Alex Bowman  Procedure(s) Performed: Procedure(s) (LRB): CARDIOVERSION (N/A)  Patient location during evaluation: PACU Anesthesia Type: General Level of consciousness: sedated and patient cooperative Pain management: pain level controlled Vital Signs Assessment: post-procedure vital signs reviewed and stable Respiratory status: spontaneous breathing Cardiovascular status: stable Anesthetic complications: no       Last Vitals:  Vitals:   11/27/16 1400 11/27/16 1410  BP: (!) 117/91 128/70  Pulse: (!) 51 (!) 51  Resp: 14 16  Temp:      Last Pain:  Vitals:   11/27/16 1350  TempSrc: Axillary                 Nolon Nations

## 2016-12-04 ENCOUNTER — Encounter (HOSPITAL_COMMUNITY): Payer: Self-pay | Admitting: Nurse Practitioner

## 2016-12-04 ENCOUNTER — Ambulatory Visit (HOSPITAL_COMMUNITY)
Admission: RE | Admit: 2016-12-04 | Discharge: 2016-12-04 | Disposition: A | Discharge status: Home / Self Care | Payer: Medicare Other | Source: Ambulatory Visit | Attending: Nurse Practitioner | Admitting: Nurse Practitioner

## 2016-12-04 VITALS — BP 124/76 | HR 110 | Ht 70.0 in | Wt 304.0 lb

## 2016-12-04 DIAGNOSIS — Z7984 Long term (current) use of oral hypoglycemic drugs: Secondary | ICD-10-CM | POA: Diagnosis not present

## 2016-12-04 DIAGNOSIS — I481 Persistent atrial fibrillation: Secondary | ICD-10-CM | POA: Diagnosis present

## 2016-12-04 DIAGNOSIS — Z7901 Long term (current) use of anticoagulants: Secondary | ICD-10-CM | POA: Diagnosis not present

## 2016-12-04 DIAGNOSIS — Z87891 Personal history of nicotine dependence: Secondary | ICD-10-CM | POA: Insufficient documentation

## 2016-12-04 DIAGNOSIS — Z6841 Body Mass Index (BMI) 40.0 and over, adult: Secondary | ICD-10-CM | POA: Diagnosis not present

## 2016-12-04 DIAGNOSIS — Z803 Family history of malignant neoplasm of breast: Secondary | ICD-10-CM | POA: Diagnosis not present

## 2016-12-04 DIAGNOSIS — Z8042 Family history of malignant neoplasm of prostate: Secondary | ICD-10-CM | POA: Insufficient documentation

## 2016-12-04 DIAGNOSIS — M1990 Unspecified osteoarthritis, unspecified site: Secondary | ICD-10-CM | POA: Diagnosis not present

## 2016-12-04 DIAGNOSIS — E1122 Type 2 diabetes mellitus with diabetic chronic kidney disease: Secondary | ICD-10-CM | POA: Insufficient documentation

## 2016-12-04 DIAGNOSIS — Z87442 Personal history of urinary calculi: Secondary | ICD-10-CM | POA: Insufficient documentation

## 2016-12-04 DIAGNOSIS — Z79899 Other long term (current) drug therapy: Secondary | ICD-10-CM | POA: Diagnosis not present

## 2016-12-04 DIAGNOSIS — N189 Chronic kidney disease, unspecified: Secondary | ICD-10-CM | POA: Diagnosis not present

## 2016-12-04 DIAGNOSIS — K219 Gastro-esophageal reflux disease without esophagitis: Secondary | ICD-10-CM | POA: Diagnosis not present

## 2016-12-04 DIAGNOSIS — I872 Venous insufficiency (chronic) (peripheral): Secondary | ICD-10-CM | POA: Diagnosis not present

## 2016-12-04 DIAGNOSIS — Z9181 History of falling: Secondary | ICD-10-CM | POA: Insufficient documentation

## 2016-12-04 DIAGNOSIS — E785 Hyperlipidemia, unspecified: Secondary | ICD-10-CM | POA: Insufficient documentation

## 2016-12-04 DIAGNOSIS — I129 Hypertensive chronic kidney disease with stage 1 through stage 4 chronic kidney disease, or unspecified chronic kidney disease: Secondary | ICD-10-CM | POA: Insufficient documentation

## 2016-12-04 DIAGNOSIS — I4819 Other persistent atrial fibrillation: Secondary | ICD-10-CM

## 2016-12-04 LAB — BASIC METABOLIC PANEL
ANION GAP: 9 (ref 5–15)
BUN: 22 mg/dL — ABNORMAL HIGH (ref 6–20)
CHLORIDE: 106 mmol/L (ref 101–111)
CO2: 24 mmol/L (ref 22–32)
Calcium: 9.1 mg/dL (ref 8.9–10.3)
Creatinine, Ser: 1.27 mg/dL — ABNORMAL HIGH (ref 0.61–1.24)
GFR calc Af Amer: >60 mL/min (ref >60)
GFR calc non Af Amer: 55 mL/min — ABNORMAL LOW (ref >60)
GLUCOSE: 109 mg/dL — AB (ref 65–99)
POTASSIUM: 4.6 mmol/L (ref 3.5–5.1)
Sodium: 139 mmol/L (ref 135–145)

## 2016-12-04 LAB — TSH: TSH: 1.872 u[IU]/mL (ref 0.350–4.500)

## 2016-12-04 MED ORDER — METOPROLOL SUCCINATE ER 50 MG PO TB24: ORAL_TABLET | ORAL | 1 refills | Status: DC

## 2016-12-04 NOTE — Patient Instructions (Addendum)
Your physician has recommended you make the following change in your medication: 1)Increase metoprolol to 75mg  (1 and 1/2 tablets of the 50mg  tablets) twice a day

## 2016-12-04 NOTE — Progress Notes (Signed)
Primary Care Physician: Phineas Inches, MD Referring Physician:[facility] ER f/u   Alex Bowman is a 71 y.o. male with a h/o morbid obesity, DM, HTN, that presented to [facility] ER after 2 falls in one day. He is not sure why he fell but  felt that his legs would not support him. [name] He fell 2 days earlier on the ice. He does have issues with bad knees and walks with a walker. He is trying to lose weight to get below 300 lbs so he can have knee surgery. In fact, he was started on phentermine 1-2 months ago to help with weight lose. He has lost 22 lbs.  Weight 15 months ago, he was over 400 lbs.EKG in the ER showed Afib with v rate of 113 bpm. He says that he had some issues with afib within the last year and saw a MD in [facility] and when he went back for f/u he was in [place], so he never started anticoagulant and did not have any further issues with afib.  He was  not started on anticoagulant in the ER due to falls but pt states that he does not normally fall.   He is not aware of irregular heart beat this am with afib at 135 bpm. He denies increased dyspnea or fatigue.  He does not drink alcohol, no excessive caffeine, no tobacco, denies sleep apnea. Chadsvasc  Score of 3.  F/u in afib clinic, just had echo but has not been read of as yet. He is doing well on eliquis no missed odese since started . He is still on phentermine, which could have contributed to afib, has not been back to PCP to discuss stopping this. His weight is down 5 more lbs but believes he will stop drug. He is still poorly rate controlled and will increase dose of BB.  He returns to afib clinic for one week f/u. He remains in afib with rvr. He has been off phentermine x one week but has lost an additional 11 lbs. He is hoping to get his knees done in the near future so he can ambulate better and was told if he got under 300 lbs, he would be a better surgical candidate. He can now be scheduled for cardioversion as he is close to being on  anticoagulation x 3 weeks, first full day 3/23.He has not had any further dizziness, falls. Echo reviewed.  F/u cardioversion, 4/25, he did have a successful cardioversion but unfortunately has returned to afib since then. He could tell no difference in NSR and thought he was still in rhythm today. Since he is asymptomatic,  will plan on rate control. Will need additional increase in BB.  Today, he denies symptoms of palpitations, chest pain, shortness of breath, orthopnea, PND, lower extremity edema, dizziness, presyncope, syncope, or neurologic sequela. The patient is tolerating medications without difficulties and is otherwise without complaint today.   Past Medical History:  Diagnosis Date  . Arthritis   . Bilateral lower extremity edema    Chronic, with venous insufficiency  . Cataract    beginning  . Chronic kidney disease    kidney stones  . Complication of anesthesia    stayed awake during colonoscopy  . Diabetes mellitus without complication ([place])    "on medicine, but numbers have been good for years since weight loss"  . Dyslipidemia    Monitored by Primary Physician. On statin  . GERD (gastroesophageal reflux disease)   . Hypertension   .  Knee pain    R>L  . Morbid obesity with BMI of 45.0-49.9, adult ([place])    Attempted weight loss efforts at last visit had BMI down to 42, unfortunately back up to 46 with weight going up to 312 lb from 285 lb  . Pneumonia 20 years ago   h/o   Past Surgical History:  Procedure Laterality Date  . 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 Mild elevation in LVDP, EF 55%. --> False-positive stress test  . CARDIOVERSION N/A 11/27/2016   Procedure: CARDIOVERSION;  Surgeon: Skeet Latch, MD;  Location: [facility];  Service: Cardiovascular;  Laterality: N/A;  . COLONOSCOPY    . CYSTOSCOPY WITH STENT PLACEMENT Left 04/19/2013   Procedure: CYSTOSCOPY WITH STENT PLACEMENT;  Surgeon: Alexis Frock,  MD;  Location: [facility];  Service: Urology;  Laterality: Left;  RIGHT RETROGRADE PYELOGRAM  . CYSTOSCOPY WITH URETEROSCOPY Left 04/21/2013   Procedure: CYSTOSCOPY WITH URETEROSCOPY bilateral retrograde, bilateral stent change, stone extraction;  Surgeon: Alexis Frock, MD;  Location: [facility];  Service: Urology;  Laterality: Left;  bilateral ureters  . HAND SURGERY Bilateral 15 years ago  . HOLMIUM LASER APPLICATION Right 8/83/2549   Procedure: HOLMIUM LASER APPLICATION;  Surgeon: Alexis Frock, MD;  Location: [facility];  Service: Urology;  Laterality: Right;  . NEPHROLITHOTOMY Right 04/19/2013   Procedure: NEPHROLITHOTOMY PERCUTANEOUS;  Surgeon: Alexis Frock, MD;  Location: [facility];  Service: Urology;  Laterality: Right;  . NEPHROLITHOTOMY Right 04/21/2013   Procedure: NEPHROLITHOTOMY PERCUTANEOUS RIGHT 2ND STAGE PERCUTANEOUS NEPHROLITHOTOMY;  Surgeon: Alexis Frock, MD;  Location: [facility];  Service: Urology;  Laterality: Right;  . NM MYOVIEW LTD; [place]  May 2012   Suggested as high risk with 3 times a day 1.2 to, mild to moderate 3 times a day affecting basolateral and mid lateral walls.  [name] POLYPECTOMY      Current Outpatient Prescriptions  Medication Sig Dispense Refill  . apixaban (ELIQUIS) 5 MG TABS tablet Take 1 tablet (5 mg total) by mouth 2 (two) times daily. 60 tablet 3  . atorvastatin (LIPITOR) 40 MG tablet Take 40 mg by mouth every morning.    . cholecalciferol (VITAMIN D) 1000 UNITS tablet Take 1,000 Units by mouth daily.    [name] HYDROcodone-acetaminophen (NORCO) 10-325 MG per tablet Take 1 tablet by mouth every 6 (six) hours as needed for pain. 30 tablet 0  . hydrOXYzine (ATARAX/VISTARIL) 10 MG tablet 1 tablet daily as needed.    [name] lisinopril (PRINIVIL,ZESTRIL) 20 MG tablet TAKE 1 TABLET BY MOUTH TWICE DAILY    . metFORMIN (GLUCOPHAGE) 500 MG tablet Take 500 mg by mouth 2 (two) times daily with a meal.    . metoprolol succinate (TOPROL-XL) 50 MG 24 hr tablet Take 75mg  (1 and 1/2  tablets) twice a day by mouth 180 tablet 1  . Multiple Vitamin (MULTIVITAMIN WITH MINERALS) TABS tablet Take 1 tablet by mouth daily.    . sertraline (ZOLOFT) 50 MG tablet 1 tablet daily.    . solifenacin (VESICARE) 5 MG tablet Take 5 mg by mouth daily.     No current facility-administered medications for this encounter.     No Known Allergies  Social History   Social History  . Marital status: Married    Spouse name: N/A  . Number of children: N/A  . Years of education: N/A   Occupational History  . Not on file.   Social History Main Topics  . Smoking status: Former  Smoker    Packs/day: 1.00    Years: 30.00    Types: Cigarettes    Quit date: 03/02/1997  . Smokeless tobacco: Former Systems developer    Types: Chew    Quit date: 08/12/2002  . Alcohol use No  . Drug use: No  . Sexual activity: Not on file   Other Topics Concern  . Not on file   Social History Narrative   He is a married father of 2, grandfather of one. Quit smoking over 15 years ago. Does not drink. Limited exercise due to knees and hip pain.    Family History  Problem Relation Age of Onset  . Breast cancer Mother   . Prostate cancer Father   . Colon cancer Neg Hx     ROS- All systems are reviewed and negative except as per the HPI above  Physical Exam: Vitals:   12/04/16 1034  BP: 124/76  Pulse: (!) 110  Weight: (!) 304 lb (137.9 kg)  Height: 5\' 10"  (1.778 m)   Wt Readings from Last 3 Encounters:  12/04/16 (!) 304 lb (137.9 kg)  11/27/16 296 lb (134.3 kg)  11/20/16 (!) 300 lb 6.4 oz (136.3 kg)    Labs: Lab Results  Component Value Date   NA 141 11/20/2016   K 4.5 11/20/2016   CL 102 11/20/2016   CO2 28 11/20/2016   GLUCOSE 110 (H) 11/20/2016   BUN 24 (H) 11/20/2016   CREATININE 1.55 (H) 11/20/2016   CALCIUM 9.5 11/20/2016   No results found for: INR No results found for: CHOL, HDL, LDLCALC, TRIG   GEN- The patient is well appearing, alert and oriented x 3 today.   Head- normocephalic,  atraumatic Eyes-  Sclera clear, conjunctiva pink Ears- hearing intact Oropharynx- clear Neck- supple, no JVP Lymph- no cervical lymphadenopathy Lungs- Clear to ausculation bilaterally, normal work of breathing Heart- irregular rate and rhythm, no murmurs, rubs or gallops, PMI not laterally displaced GI- soft, NT, ND, + BS Extremities- no clubbing, cyanosis, or edema MS- no significant deformity or atrophy Skin- no rash or lesion Psych- euthymic mood, full affect Neuro- strength and sensation are intact  EKG- afib at 110 bpm, qrs int 80 ms, qtc 429 ms Epic records reviewed Echo-Study Conclusions  - Procedure narrative: Transthoracic echocardiography. Image   quality was poor. The study was technically difficult, as a   result of poor sound wave transmission and body habitus. - Left ventricle: The cavity size was normal. Wall thickness was   increased in a pattern of moderate LVH. Systolic function was   normal. The estimated ejection fraction was in the range of 50%   to 55%. Images were inadequate for LV wall motion assessment. The   study is not technically sufficient to allow evaluation of LV   diastolic function. - Aortic valve: Poorly visualized. Mildly calcified leaflets. Mild   stenosis, moderate regurgitation. Mean gradient (S): 8 mm Hg.   Peak gradient (S): 17 mm Hg. Valve area (VTI): 1.88 cm^2. Valve   area (Vmax): 1.59 cm^2. Valve area (Vmean): 1.6 cm^2.   Regurgitation pressure half-time: 346 ms. - Mitral valve: Calcified annulus. Mildly thickened leaflets .   There was mild regurgitation. - Left atrium: The atrium was mildly dilated. - Right ventricle: The cavity size was moderately dilated. Systolic   function is mildly reduced. - Right atrium: The atrium was mildly dilated. - Tricuspid valve: There was mild regurgitation. - Pulmonary arteries: PA peak pressure: 53 mm Hg (S). - Inferior vena  cava: The vessel was dilated. The respirophasic   diameter changes  were blunted (< 50%), consistent with elevated   central venous pressure.  Impressions:  - Technically difficult study with poor echo windows. LVEF around   50-55%, moderate LVH, calcified aortic valve with mild stenosis   and mild to moderate regurgitation, mild MR, mild biatrial   enlargement, moderate RVE with reduced RV systolic function, mild   TR, RVSP 53 mmHg, dilated IVC.   Assessment and Plan: 1. Persistent afib Successful cardioversion but now with early return of afib, asymptomatic and did not feel any better in SR thatn afib Will plan on rate control, due to his weight, it may be difficult to restore and maintain SR Chadsvasc score is at least 3, he states no further falls. He is doing well on eliquis 5 mg bid without bleeding issues, with chadsvasc score of at least 3 Increase metoprolol ER to 50 11/2 tabs  bid for better rate control TSH today  F/u in  2 weeks  Butch Penny C. Freddy Kinne, [facility] [address] [place], [place] [address] [phone]

## 2016-12-13 ENCOUNTER — Ambulatory Visit (HOSPITAL_COMMUNITY)
Admission: RE | Admit: 2016-12-13 | Discharge: 2016-12-13 | Disposition: A | Discharge status: Home / Self Care | Payer: Medicare Other | Source: Ambulatory Visit | Attending: Nurse Practitioner | Admitting: Nurse Practitioner

## 2016-12-13 ENCOUNTER — Encounter (HOSPITAL_COMMUNITY): Payer: Self-pay | Admitting: Nurse Practitioner

## 2016-12-13 VITALS — BP 118/64 | HR 52 | Ht 70.0 in | Wt 313.6 lb

## 2016-12-13 DIAGNOSIS — Z7984 Long term (current) use of oral hypoglycemic drugs: Secondary | ICD-10-CM | POA: Insufficient documentation

## 2016-12-13 DIAGNOSIS — Z79899 Other long term (current) drug therapy: Secondary | ICD-10-CM | POA: Insufficient documentation

## 2016-12-13 DIAGNOSIS — E1122 Type 2 diabetes mellitus with diabetic chronic kidney disease: Secondary | ICD-10-CM | POA: Diagnosis not present

## 2016-12-13 DIAGNOSIS — I129 Hypertensive chronic kidney disease with stage 1 through stage 4 chronic kidney disease, or unspecified chronic kidney disease: Secondary | ICD-10-CM | POA: Insufficient documentation

## 2016-12-13 DIAGNOSIS — Z87891 Personal history of nicotine dependence: Secondary | ICD-10-CM | POA: Insufficient documentation

## 2016-12-13 DIAGNOSIS — I481 Persistent atrial fibrillation: Secondary | ICD-10-CM | POA: Diagnosis not present

## 2016-12-13 DIAGNOSIS — N189 Chronic kidney disease, unspecified: Secondary | ICD-10-CM | POA: Diagnosis not present

## 2016-12-13 DIAGNOSIS — Z6841 Body Mass Index (BMI) 40.0 and over, adult: Secondary | ICD-10-CM | POA: Insufficient documentation

## 2016-12-13 DIAGNOSIS — E785 Hyperlipidemia, unspecified: Secondary | ICD-10-CM | POA: Insufficient documentation

## 2016-12-13 DIAGNOSIS — Z7901 Long term (current) use of anticoagulants: Secondary | ICD-10-CM | POA: Diagnosis not present

## 2016-12-13 DIAGNOSIS — K219 Gastro-esophageal reflux disease without esophagitis: Secondary | ICD-10-CM | POA: Diagnosis not present

## 2016-12-13 DIAGNOSIS — I4819 Other persistent atrial fibrillation: Secondary | ICD-10-CM

## 2016-12-13 NOTE — Progress Notes (Signed)
Primary Care Physician: Phineas Inches, MD Referring Physician:[facility] ER f/u   Alex Bowman is a 71 y.o. male with a h/o morbid obesity, DM, HTN, that presented to [facility] ER after 2 falls in one day. He is not sure why he fell but  felt that his legs would not support him. [name] He fell 2 days earlier on the ice. He does have issues with bad knees and walks with a walker. He is trying to lose weight to get below 300 lbs so he can have knee surgery. In fact, he was started on phentermine 1-2 months ago to help with weight lose. He has lost 22 lbs.  Weight 15 months ago, he was over 400 lbs.EKG in the ER showed Afib with v rate of 113 bpm. He says that he had some issues with afib within the last year and saw a MD in [facility] and when he went back for f/u he was in [place], so he never started anticoagulant and did not have any further issues with afib.  He was  not started on anticoagulant in the ER due to falls but pt states that he does not normally fall.   He is not aware of irregular heart beat this am with afib at 135 bpm. He denies increased dyspnea or fatigue.  He does not drink alcohol, no excessive caffeine, no tobacco, denies sleep apnea. Chadsvasc  Score of 3.  F/u in afib clinic, just had echo but has not been read of as yet. He is doing well on eliquis no missed odese since started . He is still on phentermine, which could have contributed to afib, has not been back to PCP to discuss stopping this. His weight is down 5 more lbs but believes he will stop drug. He is still poorly rate controlled and will increase dose of BB.  He returns to afib clinic for one week f/u. He remains in afib with rvr. He has been off phentermine x one week but has lost an additional 11 lbs. He is hoping to get his knees done in the near future so he can ambulate better and was told if he got under 300 lbs, he would be a better surgical candidate. He can now be scheduled for cardioversion as he is close to being on  anticoagulation x 3 weeks, first full day 3/23.He has not had any further dizziness, falls. Echo reviewed.  F/u cardioversion, 4/25, he did have a successful cardioversion but unfortunately has returned to afib since then. He could tell no difference in NSR and thought he was still in rhythm today. Since he is asymptomatic,  will plan on rate control. Will need additional increase in BB.  F/U afib clinic 5/6 and is sinus rhythm. Pt was unaware. He states that he still has shortness of breath and fatigue with ambulation probably 2/2 morbid obesity and knee issues, walking with a walker.ZContinues on xarelto with a chadsvasc score of at least 3.  Today, he denies symptoms of palpitations, chest pain,  orthopnea, PND, lower extremity edema, dizziness, presyncope, syncope, or neurologic sequela.Positive for fatigue and exertional dyspnea with ambulation. The patient is tolerating medications without difficulties and is otherwise without complaint today.   Past Medical History:  Diagnosis Date  . Arthritis   . Bilateral lower extremity edema    Chronic, with venous insufficiency  . Cataract    beginning  . Chronic kidney disease    kidney stones  . Complication of anesthesia  stayed awake during colonoscopy  . Diabetes mellitus without complication ([place])    "on medicine, but numbers have been good for years since weight loss"  . Dyslipidemia    Monitored by Primary Physician. On statin  . GERD (gastroesophageal reflux disease)   . Hypertension   . Knee pain    R>L  . Morbid obesity with BMI of 45.0-49.9, adult ([place])    Attempted weight loss efforts at last visit had BMI down to 42, unfortunately back up to 46 with weight going up to 312 lb from 285 lb  . Pneumonia 20 years ago   h/o   Past Surgical History:  Procedure Laterality Date  . 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 Mild elevation in LVDP, EF 55%. --> False-positive stress test    . CARDIOVERSION N/A 11/27/2016   Procedure: CARDIOVERSION;  Surgeon: Skeet Latch, MD;  Location: [place];  Service: Cardiovascular;  Laterality: N/A;  . COLONOSCOPY    . CYSTOSCOPY WITH STENT PLACEMENT Left 04/19/2013   Procedure: CYSTOSCOPY WITH STENT PLACEMENT;  Surgeon: Alexis Frock, MD;  Location: [facility];  Service: Urology;  Laterality: Left;  RIGHT RETROGRADE PYELOGRAM  . CYSTOSCOPY WITH URETEROSCOPY Left 04/21/2013   Procedure: CYSTOSCOPY WITH URETEROSCOPY bilateral retrograde, bilateral stent change, stone extraction;  Surgeon: Alexis Frock, MD;  Location: [facility];  Service: Urology;  Laterality: Left;  bilateral ureters  . HAND SURGERY Bilateral 15 years ago  . HOLMIUM LASER APPLICATION Right 0/03/6760   Procedure: HOLMIUM LASER APPLICATION;  Surgeon: Alexis Frock, MD;  Location: [facility];  Service: Urology;  Laterality: Right;  . NEPHROLITHOTOMY Right 04/19/2013   Procedure: NEPHROLITHOTOMY PERCUTANEOUS;  Surgeon: Alexis Frock, MD;  Location: [facility];  Service: Urology;  Laterality: Right;  . NEPHROLITHOTOMY Right 04/21/2013   Procedure: NEPHROLITHOTOMY PERCUTANEOUS RIGHT 2ND STAGE PERCUTANEOUS NEPHROLITHOTOMY;  Surgeon: Alexis Frock, MD;  Location: [facility];  Service: Urology;  Laterality: Right;  . NM MYOVIEW LTD; [place]  May 2012   Suggested as high risk with 3 times a day 1.2 to, mild to moderate 3 times a day affecting basolateral and mid lateral walls.  [name] POLYPECTOMY      Current Outpatient Prescriptions  Medication Sig Dispense Refill  . apixaban (ELIQUIS) 5 MG TABS tablet Take 1 tablet (5 mg total) by mouth 2 (two) times daily. 60 tablet 3  . atorvastatin (LIPITOR) 40 MG tablet Take 40 mg by mouth every morning.    . cholecalciferol (VITAMIN D) 1000 UNITS tablet Take 1,000 Units by mouth daily.    [name] HYDROcodone-acetaminophen (NORCO) 10-325 MG per tablet Take 1 tablet by mouth every 6 (six) hours as needed for pain. 30 tablet 0  . hydrOXYzine (ATARAX/VISTARIL) 10  MG tablet 1 tablet daily as needed.    [name] lisinopril (PRINIVIL,ZESTRIL) 20 MG tablet TAKE 1 TABLET BY MOUTH TWICE DAILY    . metFORMIN (GLUCOPHAGE) 500 MG tablet Take 500 mg by mouth 2 (two) times daily with a meal.    . metoprolol succinate (TOPROL-XL) 50 MG 24 hr tablet Take 75mg  (1 and 1/2 tablets) twice a day by mouth 180 tablet 1  . Multiple Vitamin (MULTIVITAMIN WITH MINERALS) TABS tablet Take 1 tablet by mouth daily.    . sertraline (ZOLOFT) 50 MG tablet 1 tablet daily.    . solifenacin (VESICARE) 5 MG tablet Take 5 mg by mouth daily.     No current facility-administered medications for this encounter.     No  Known Allergies  Social History   Social History  . Marital status: Married    Spouse name: N/A  . Number of children: N/A  . Years of education: N/A   Occupational History  . Not on file.   Social History Main Topics  . Smoking status: Former Smoker    Packs/day: 1.00    Years: 30.00    Types: Cigarettes    Quit date: 03/02/1997  . Smokeless tobacco: Former Systems developer    Types: Chew    Quit date: 08/12/2002  . Alcohol use No  . Drug use: No  . Sexual activity: Not on file   Other Topics Concern  . Not on file   Social History Narrative   He is a married father of 2, grandfather of one. Quit smoking over 15 years ago. Does not drink. Limited exercise due to knees and hip pain.    Family History  Problem Relation Age of Onset  . Breast cancer Mother   . Prostate cancer Father   . Colon cancer Neg Hx     ROS- All systems are reviewed and negative except as per the HPI above  Physical Exam: Vitals:   12/13/16 1057  BP: 118/64  Pulse: (!) 52  Weight: (!) 313 lb 9.6 oz (142.2 kg)  Height: 5\' 10"  (1.778 m)   Wt Readings from Last 3 Encounters:  12/13/16 (!) 313 lb 9.6 oz (142.2 kg)  12/04/16 (!) 304 lb (137.9 kg)  11/27/16 296 lb (134.3 kg)    Labs: Lab Results  Component Value Date   NA 139 12/04/2016   K 4.6 12/04/2016   CL 106 12/04/2016   CO2  24 12/04/2016   GLUCOSE 109 (H) 12/04/2016   BUN 22 (H) 12/04/2016   CREATININE 1.27 (H) 12/04/2016   CALCIUM 9.1 12/04/2016   No results found for: INR No results found for: CHOL, HDL, LDLCALC, TRIG   GEN- The patient is well appearing, alert and oriented x 3 today.   Head- normocephalic, atraumatic Eyes-  Sclera clear, conjunctiva pink Ears- hearing intact Oropharynx- clear Neck- supple, no JVP Lymph- no cervical lymphadenopathy Lungs- Clear to ausculation bilaterally, normal work of breathing Heart- regular rate and rhythm, no murmurs, rubs or gallops, PMI not laterally displaced GI- soft, NT, ND, + BS Extremities- no clubbing, cyanosis, or edema MS- no significant deformity or atrophy Skin- no rash or lesion Psych- euthymic mood, full affect Neuro- strength and sensation are intact  EKG-Sinus brady at 52 bpm, pr int 200 ms, qrs int 82 ms, qtc 438 ms Epic records reviewed Echo-Study Conclusions  - Procedure narrative: Transthoracic echocardiography. Image   quality was poor. The study was technically difficult, as a   result of poor sound wave transmission and body habitus. - Left ventricle: The cavity size was normal. Wall thickness was   increased in a pattern of moderate LVH. Systolic function was   normal. The estimated ejection fraction was in the range of 50%   to 55%. Images were inadequate for LV wall motion assessment. The   study is not technically sufficient to allow evaluation of LV   diastolic function. - Aortic valve: Poorly visualized. Mildly calcified leaflets. Mild   stenosis, moderate regurgitation. Mean gradient (S): 8 mm Hg.   Peak gradient (S): 17 mm Hg. Valve area (VTI): 1.88 cm^2. Valve   area (Vmax): 1.59 cm^2. Valve area (Vmean): 1.6 cm^2.   Regurgitation pressure half-time: 346 ms. - Mitral valve: Calcified annulus. Mildly thickened leaflets .  There was mild regurgitation. - Left atrium: The atrium was mildly dilated. - Right ventricle:  The cavity size was moderately dilated. Systolic   function is mildly reduced. - Right atrium: The atrium was mildly dilated. - Tricuspid valve: There was mild regurgitation. - Pulmonary arteries: PA peak pressure: 53 mm Hg (S). - Inferior vena cava: The vessel was dilated. The respirophasic   diameter changes were blunted (< 50%), consistent with elevated   central venous pressure.  Impressions:  - Technically difficult study with poor echo windows. LVEF around   50-55%, moderate LVH, calcified aortic valve with mild stenosis   and mild to moderate regurgitation, mild MR, mild biatrial   enlargement, moderate RVE with reduced RV systolic function, mild   TR, RVSP 53 mmHg, dilated IVC.   Assessment and Plan: 1. Persistent afib Successful cardioversion but   with early return of afib, asymptomatic and did not feel any better in SR thatn afib However, today he is in S brady, unfortunately he cannot tell when he is back in SR and does not feel improved If returns to afib, plan on continuing rate control, due to his weight, it may be difficult to restore and maintain SR Chadsvasc score is at least 3, he states no further falls, no bleeding issues. Continue eliquis 5 mg bid  F/u with pcp as scheduled Afib clinic as needed  Butch Penny C. Shaelin Lalley, [facility] [address] [place], [place] [address] [phone]

## 2017-02-21 ENCOUNTER — Emergency Department (HOSPITAL_COMMUNITY): Payer: Medicare Other

## 2017-02-21 ENCOUNTER — Encounter (HOSPITAL_COMMUNITY): Payer: Self-pay | Admitting: *Deleted

## 2017-02-21 ENCOUNTER — Observation Stay (HOSPITAL_COMMUNITY)
Admission: EM | Admit: 2017-02-21 | Discharge: 2017-02-24 | Disposition: A | Discharge status: Home / Self Care | Payer: Medicare Other | Attending: Internal Medicine | Admitting: Internal Medicine

## 2017-02-21 DIAGNOSIS — S22060A Wedge compression fracture of T7-T8 vertebra, initial encounter for closed fracture: Secondary | ICD-10-CM | POA: Diagnosis present

## 2017-02-21 DIAGNOSIS — I481 Persistent atrial fibrillation: Secondary | ICD-10-CM | POA: Diagnosis not present

## 2017-02-21 DIAGNOSIS — M4316 Spondylolisthesis, lumbar region: Secondary | ICD-10-CM | POA: Insufficient documentation

## 2017-02-21 DIAGNOSIS — I2510 Atherosclerotic heart disease of native coronary artery without angina pectoris: Secondary | ICD-10-CM | POA: Diagnosis not present

## 2017-02-21 DIAGNOSIS — M47816 Spondylosis without myelopathy or radiculopathy, lumbar region: Secondary | ICD-10-CM | POA: Diagnosis not present

## 2017-02-21 DIAGNOSIS — M48061 Spinal stenosis, lumbar region without neurogenic claudication: Secondary | ICD-10-CM | POA: Diagnosis not present

## 2017-02-21 DIAGNOSIS — Z87442 Personal history of urinary calculi: Secondary | ICD-10-CM | POA: Diagnosis not present

## 2017-02-21 DIAGNOSIS — E8881 Metabolic syndrome: Secondary | ICD-10-CM | POA: Insufficient documentation

## 2017-02-21 DIAGNOSIS — Z6841 Body Mass Index (BMI) 40.0 and over, adult: Secondary | ICD-10-CM | POA: Insufficient documentation

## 2017-02-21 DIAGNOSIS — I10 Essential (primary) hypertension: Secondary | ICD-10-CM | POA: Diagnosis present

## 2017-02-21 DIAGNOSIS — G4733 Obstructive sleep apnea (adult) (pediatric): Secondary | ICD-10-CM | POA: Diagnosis not present

## 2017-02-21 DIAGNOSIS — J90 Pleural effusion, not elsewhere classified: Secondary | ICD-10-CM | POA: Insufficient documentation

## 2017-02-21 DIAGNOSIS — M4802 Spinal stenosis, cervical region: Secondary | ICD-10-CM | POA: Diagnosis not present

## 2017-02-21 DIAGNOSIS — E1122 Type 2 diabetes mellitus with diabetic chronic kidney disease: Secondary | ICD-10-CM | POA: Insufficient documentation

## 2017-02-21 DIAGNOSIS — R600 Localized edema: Secondary | ICD-10-CM | POA: Diagnosis present

## 2017-02-21 DIAGNOSIS — I480 Paroxysmal atrial fibrillation: Secondary | ICD-10-CM | POA: Diagnosis not present

## 2017-02-21 DIAGNOSIS — I1310 Hypertensive heart and chronic kidney disease without heart failure, with stage 1 through stage 4 chronic kidney disease, or unspecified chronic kidney disease: Secondary | ICD-10-CM | POA: Diagnosis not present

## 2017-02-21 DIAGNOSIS — Z7901 Long term (current) use of anticoagulants: Secondary | ICD-10-CM | POA: Insufficient documentation

## 2017-02-21 DIAGNOSIS — Z7984 Long term (current) use of oral hypoglycemic drugs: Secondary | ICD-10-CM | POA: Diagnosis not present

## 2017-02-21 DIAGNOSIS — E785 Hyperlipidemia, unspecified: Secondary | ICD-10-CM | POA: Diagnosis not present

## 2017-02-21 DIAGNOSIS — D649 Anemia, unspecified: Secondary | ICD-10-CM

## 2017-02-21 DIAGNOSIS — D631 Anemia in chronic kidney disease: Secondary | ICD-10-CM | POA: Diagnosis not present

## 2017-02-21 DIAGNOSIS — W19XXXA Unspecified fall, initial encounter: Secondary | ICD-10-CM | POA: Insufficient documentation

## 2017-02-21 DIAGNOSIS — E1151 Type 2 diabetes mellitus with diabetic peripheral angiopathy without gangrene: Secondary | ICD-10-CM | POA: Insufficient documentation

## 2017-02-21 DIAGNOSIS — E119 Type 2 diabetes mellitus without complications: Secondary | ICD-10-CM

## 2017-02-21 DIAGNOSIS — I4819 Other persistent atrial fibrillation: Secondary | ICD-10-CM | POA: Diagnosis present

## 2017-02-21 DIAGNOSIS — N183 Chronic kidney disease, stage 3 unspecified: Secondary | ICD-10-CM

## 2017-02-21 DIAGNOSIS — M8580 Other specified disorders of bone density and structure, unspecified site: Secondary | ICD-10-CM | POA: Insufficient documentation

## 2017-02-21 DIAGNOSIS — Z9889 Other specified postprocedural states: Secondary | ICD-10-CM | POA: Insufficient documentation

## 2017-02-21 DIAGNOSIS — Z87891 Personal history of nicotine dependence: Secondary | ICD-10-CM | POA: Insufficient documentation

## 2017-02-21 LAB — CBC WITH DIFFERENTIAL/PLATELET
BASOS ABS: 0 10*3/uL (ref 0.0–0.1)
Basophils Relative: 0 %
EOS ABS: 0.3 10*3/uL (ref 0.0–0.7)
Eosinophils Relative: 5 %
HCT: 33.9 % — ABNORMAL LOW (ref 39.0–52.0)
HEMOGLOBIN: 10.2 g/dL — AB (ref 13.0–17.0)
Lymphocytes Relative: 23 %
Lymphs Abs: 1.7 10*3/uL (ref 0.7–4.0)
MCH: 28.7 pg (ref 26.0–34.0)
MCHC: 30.1 g/dL (ref 30.0–36.0)
MCV: 95.5 fL (ref 78.0–100.0)
Monocytes Absolute: 0.7 10*3/uL (ref 0.1–1.0)
Monocytes Relative: 9 %
NEUTROS PCT: 63 %
Neutro Abs: 4.7 10*3/uL (ref 1.7–7.7)
PLATELETS: 164 10*3/uL (ref 150–400)
RBC: 3.55 MIL/uL — AB (ref 4.22–5.81)
RDW: 14.9 % (ref 11.5–15.5)
WBC: 7.4 10*3/uL (ref 4.0–10.5)

## 2017-02-21 LAB — BASIC METABOLIC PANEL
ANION GAP: 11 (ref 5–15)
BUN: 28 mg/dL — AB (ref 6–20)
CHLORIDE: 102 mmol/L (ref 101–111)
CO2: 26 mmol/L (ref 22–32)
Calcium: 9.1 mg/dL (ref 8.9–10.3)
Creatinine, Ser: 1.87 mg/dL — ABNORMAL HIGH (ref 0.61–1.24)
GFR, EST AFRICAN AMERICAN: 40 mL/min — AB (ref >60)
GFR, EST NON AFRICAN AMERICAN: 35 mL/min — AB (ref >60)
Glucose, Bld: 106 mg/dL — ABNORMAL HIGH (ref 65–99)
POTASSIUM: 4.9 mmol/L (ref 3.5–5.1)
SODIUM: 139 mmol/L (ref 135–145)

## 2017-02-21 MED ORDER — FENTANYL CITRATE (PF) 100 MCG/2ML IJ SOLN
50.0000 ug | Freq: Once | INTRAMUSCULAR | Status: AC
  Administered 2017-02-21: 50 ug via INTRAVENOUS
  Filled 2017-02-21: qty 2

## 2017-02-21 NOTE — ED Notes (Signed)
Patient transported to CT 

## 2017-02-21 NOTE — ED Triage Notes (Signed)
Pt to ED from home post-fall. Pt reports falling backward while trying to put his shoes on. Denies hitting head. Pt is on Eliquis for a-fib. Skin tear noted to L hand.

## 2017-02-21 NOTE — Addendum Note (Signed)
Addendum  created 02/21/17 1017 by Nolon Nations, MD   Sign clinical note

## 2017-02-21 NOTE — Anesthesia Postprocedure Evaluation (Signed)
Anesthesia Post Note  Patient: Alex Bowman  Procedure(s) Performed: Procedure(s) (LRB): CARDIOVERSION (N/A)     Anesthesia Post Evaluation  Last Vitals:  Vitals:   11/27/16 1400 11/27/16 1410  BP: (!) 117/91 128/70  Pulse: (!) 51 (!) 51  Resp: 14 16  Temp:      Last Pain:  Vitals:   11/27/16 1350  TempSrc: Axillary                 Nolon Nations

## 2017-02-21 NOTE — ED Provider Notes (Signed)
[facility] DEPT Provider Note   CSN: [id number] Arrival date & time: 02/21/17  2017     History   Chief Complaint Chief Complaint  Patient presents with  . Fall    HPI MOUSA PROUT is a 71 y.o. male.  The history is provided by the patient.  Illness  This is a new problem. The current episode started 3 to 5 hours ago. The problem occurs constantly. The problem has not changed since onset.Pertinent negatives include no chest pain, no abdominal pain, no headaches and no shortness of breath. Associated symptoms comments: Back Pain. Exacerbated by: palpation. Nothing relieves the symptoms. He has tried nothing for the symptoms.    Past Medical History:  Diagnosis Date  . Arthritis   . Bilateral lower extremity edema    Chronic, with venous insufficiency  . Cataract    beginning  . Chronic kidney disease    kidney stones  . Complication of anesthesia    stayed awake during colonoscopy  . Diabetes mellitus without complication ([place])    "on medicine, but numbers have been good for years since weight loss"  . Dyslipidemia    Monitored by Primary Physician. On statin  . GERD (gastroesophageal reflux disease)   . Hypertension   . Knee pain    R>L  . Morbid obesity with BMI of 45.0-49.9, adult ([place])    Attempted weight loss efforts at last visit had BMI down to 42, unfortunately back up to 46 with weight going up to 312 lb from 285 lb  . Pneumonia 20 years ago   h/o    Patient Active Problem List   Diagnosis Date Noted  . Persistent atrial fibrillation ([place])   . Severe obesity (BMI >= 40) ([place]) 03/13/2013  . Dyslipidemia   . Hypertension   . Diabetes mellitus without complication ([place])   . Bilateral lower extremity edema   . OSA (obstructive sleep apnea)   . Metabolic syndrome 63/33/5456    Past Surgical History:  Procedure Laterality Date  . 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 Mild elevation in LVDP, EF 55%.  --> False-positive stress test  . CARDIOVERSION N/A 11/27/2016   Procedure: CARDIOVERSION;  Surgeon: Skeet Latch, MD;  Location: [facility];  Service: Cardiovascular;  Laterality: N/A;  . COLONOSCOPY    . CYSTOSCOPY WITH STENT PLACEMENT Left 04/19/2013   Procedure: CYSTOSCOPY WITH STENT PLACEMENT;  Surgeon: Alexis Frock, MD;  Location: [facility];  Service: Urology;  Laterality: Left;  RIGHT RETROGRADE PYELOGRAM  . CYSTOSCOPY WITH URETEROSCOPY Left 04/21/2013   Procedure: CYSTOSCOPY WITH URETEROSCOPY bilateral retrograde, bilateral stent change, stone extraction;  Surgeon: Alexis Frock, MD;  Location: [facility];  Service: Urology;  Laterality: Left;  bilateral ureters  . HAND SURGERY Bilateral 15 years ago  . HOLMIUM LASER APPLICATION Right 2/56/3893   Procedure: HOLMIUM LASER APPLICATION;  Surgeon: Alexis Frock, MD;  Location: [facility];  Service: Urology;  Laterality: Right;  . NEPHROLITHOTOMY Right 04/19/2013   Procedure: NEPHROLITHOTOMY PERCUTANEOUS;  Surgeon: Alexis Frock, MD;  Location: [facility];  Service: Urology;  Laterality: Right;  . NEPHROLITHOTOMY Right 04/21/2013   Procedure: NEPHROLITHOTOMY PERCUTANEOUS RIGHT 2ND STAGE PERCUTANEOUS NEPHROLITHOTOMY;  Surgeon: Alexis Frock, MD;  Location: [facility];  Service: Urology;  Laterality: Right;  . NM MYOVIEW LTD; [place]  May 2012   Suggested as high risk with 3 times a day 1.2 to, mild to moderate 3 times a day affecting basolateral and mid lateral walls.  [name]  POLYPECTOMY         Home Medications    Prior to Admission medications   Medication Sig Start Date End Date Taking? Authorizing Provider  apixaban (ELIQUIS) 5 MG TABS tablet Take 1 tablet (5 mg total) by mouth 2 (two) times daily. 10/30/16   Sherran Needs, NP  atorvastatin (LIPITOR) 40 MG tablet Take 40 mg by mouth every morning.    [provider]  cholecalciferol (VITAMIN D) 1000 UNITS tablet Take 1,000 Units by mouth daily.    [provider]    HYDROcodone-acetaminophen (NORCO) 10-325 MG per tablet Take 1 tablet by mouth every 6 (six) hours as needed for pain. 04/26/13   Alexis Frock, MD  hydrOXYzine (ATARAX/VISTARIL) 10 MG tablet 1 tablet daily as needed. 10/23/16   [provider]  lisinopril (PRINIVIL,ZESTRIL) 20 MG tablet TAKE 1 TABLET BY MOUTH TWICE DAILY 07/11/14   [provider]  metFORMIN (GLUCOPHAGE) 500 MG tablet Take 500 mg by mouth 2 (two) times daily with a meal.    [provider]  metoprolol succinate (TOPROL-XL) 50 MG 24 hr tablet Take 75mg  (1 and 1/2 tablets) twice a day by mouth 12/04/16   Sherran Needs, NP  Multiple Vitamin (MULTIVITAMIN WITH MINERALS) TABS tablet Take 1 tablet by mouth daily.    [provider]  sertraline (ZOLOFT) 50 MG tablet 1 tablet daily. 07/24/16   [provider]  solifenacin (VESICARE) 5 MG tablet Take 5 mg by mouth daily.    [provider]    Family History Family History  Problem Relation Age of Onset  . Breast cancer Mother   . Prostate cancer Father   . Colon cancer Neg Hx     Social History Social History  Substance Use Topics  . Smoking status: Former Smoker    Packs/day: 1.00    Years: 30.00    Types: Cigarettes    Quit date: 03/02/1997  . Smokeless tobacco: Former Systems developer    Types: Chew    Quit date: 08/12/2002  . Alcohol use No     Allergies   Patient has no known allergies.   Review of Systems Review of Systems  Constitutional: Negative for chills and fever.  HENT: Negative for ear pain and sore throat.   Eyes: Negative for pain and visual disturbance.  Respiratory: Negative for cough and shortness of breath.   Cardiovascular: Negative for chest pain and palpitations.  Gastrointestinal: Negative for abdominal pain and vomiting.  Endocrine: Negative for polyuria.  Genitourinary: Negative for dysuria and hematuria.  Musculoskeletal: Positive for back pain. Negative for arthralgias.  Skin: Negative for  color change and rash.  Neurological: Negative for seizures, syncope and headaches.  Psychiatric/Behavioral: Negative for agitation and behavioral problems.  All other systems reviewed and are negative.    Physical Exam Updated Vital Signs BP (!) 142/70 (BP Location: Left Arm)   Pulse (!) 59   Temp 98.1 F (36.7 C)   Resp 18   SpO2 99%   Physical Exam  Constitutional: He is oriented to person, place, and time. He appears well-developed and well-nourished.  HENT:  Head: Normocephalic.  Mouth/Throat: Oropharynx is clear and moist.  Eyes: Pupils are equal, round, and reactive to light. Conjunctivae and EOM are normal.  Neck: Normal range of motion. No tracheal deviation present.  Cardiovascular: Normal rate and intact distal pulses.   Pulmonary/Chest: Effort normal. No respiratory distress.  Abdominal: Soft. Bowel sounds are normal. He exhibits no distension. There is no tenderness.  There is no rebound and no guarding.  Musculoskeletal: He exhibits tenderness (midline spinal tenderness in c, t, and L spine). He exhibits no deformity.  Neurological: He is alert and oriented to person, place, and time. No cranial nerve deficit or sensory deficit. He exhibits normal muscle tone. Coordination normal.  Skin: Skin is warm and dry.  Psychiatric: He has a normal mood and affect.     ED Treatments / Results  Labs (all labs ordered are listed, but only abnormal results are displayed) Labs Reviewed  CBC WITH DIFFERENTIAL/PLATELET - Abnormal; Notable for the following:       Result Value   RBC 3.55 (*)    Hemoglobin 10.2 (*)    HCT 33.9 (*)    All other components within normal limits  BASIC METABOLIC PANEL - Abnormal; Notable for the following:    Glucose, Bld 106 (*)    BUN 28 (*)    Creatinine, Ser 1.87 (*)    GFR calc non Af Amer 35 (*)    GFR calc Af Amer 40 (*)    All other components within normal limits    EKG  EKG Interpretation None       Radiology Ct Head Wo  Contrast  Result Date: 02/21/2017 CLINICAL DATA:  Fall with pain EXAM: CT HEAD WITHOUT CONTRAST CT CERVICAL SPINE WITHOUT CONTRAST TECHNIQUE: Multidetector CT imaging of the head and cervical spine was performed following the standard protocol without intravenous contrast. Multiplanar CT image reconstructions of the cervical spine were also generated. COMPARISON:  10/24/2016 FINDINGS: CT HEAD FINDINGS Brain: No acute territorial infarction, hemorrhage or intracranial mass is seen. There is motion degradation. Mild to moderate atrophy. Mild small vessel ischemic changes of the white matter. Nonenlarged ventricles. Vascular: No hyperdense vessels.  Carotid artery calcifications. Skull: No depressed skull fracture Sinuses/Orbits: Mucosal thickening in the ethmoid sinuses. No acute orbital abnormality. Other: None CT CERVICAL SPINE FINDINGS Alignment: Motion degradation limits the exam. Straightening of the cervical spine. Facet alignment grossly normal. Skull base and vertebrae: Craniovertebral junction is intact. No obvious fracture is seen. Soft tissues and spinal canal: Prominent prevertebral soft tissues. No visible canal hematoma Disc levels: Moderate severe multilevel degenerative disc changes from C3 through C7. Diffuse canal stenosis of the mid to lower cervical spine. Multilevel facet degenerative changes with multiple levels of foraminal stenosis. Upper chest: Partially visualized right pleural effusion. No thyroid mass. Other: None IMPRESSION: 1. Motion degradation 2. No definite CT evidence for acute intracranial abnormality 3. Multilevel advanced degenerative changes of the spine. No obvious fracture. Electronically Signed   By: Donavan Foil M.D.   On: 02/21/2017 23:55   Ct Cervical Spine Wo Contrast  Result Date: 02/21/2017 CLINICAL DATA:  Fall with pain EXAM: CT HEAD WITHOUT CONTRAST CT CERVICAL SPINE WITHOUT CONTRAST TECHNIQUE: Multidetector CT imaging of the head and cervical spine was performed  following the standard protocol without intravenous contrast. Multiplanar CT image reconstructions of the cervical spine were also generated. COMPARISON:  10/24/2016 FINDINGS: CT HEAD FINDINGS Brain: No acute territorial infarction, hemorrhage or intracranial mass is seen. There is motion degradation. Mild to moderate atrophy. Mild small vessel ischemic changes of the white matter. Nonenlarged ventricles. Vascular: No hyperdense vessels.  Carotid artery calcifications. Skull: No depressed skull fracture Sinuses/Orbits: Mucosal thickening in the ethmoid sinuses. No acute orbital abnormality. Other: None CT CERVICAL SPINE FINDINGS Alignment: Motion degradation limits the exam. Straightening of the cervical spine. Facet alignment grossly normal. Skull base and vertebrae: Craniovertebral junction is intact.  No obvious fracture is seen. Soft tissues and spinal canal: Prominent prevertebral soft tissues. No visible canal hematoma Disc levels: Moderate severe multilevel degenerative disc changes from C3 through C7. Diffuse canal stenosis of the mid to lower cervical spine. Multilevel facet degenerative changes with multiple levels of foraminal stenosis. Upper chest: Partially visualized right pleural effusion. No thyroid mass. Other: None IMPRESSION: 1. Motion degradation 2. No definite CT evidence for acute intracranial abnormality 3. Multilevel advanced degenerative changes of the spine. No obvious fracture. Electronically Signed   By: Donavan Foil M.D.   On: 02/21/2017 23:55   Ct Thoracic Spine Wo Contrast  Result Date: 02/22/2017 CLINICAL DATA:  [place] backwards well timing issues. Severe back pain. EXAM: CT THORACIC AND LUMBAR SPINE WITHOUT CONTRAST TECHNIQUE: Multidetector CT imaging of the thoracic and lumbar spine was performed without contrast. Multiplanar CT image reconstructions were also generated. COMPARISON:  Chest radiograph October 24, 2016 and lumbar spine radiographs October 24, 2016 FINDINGS: CT THORACIC  SPINE FINDINGS ALIGNMENT: Thoracic vertebral bodies are in alignment. Maintained thoracic lordosis. VERTEBRAE: Acute T7 2 column fracture with slight ventral widening. No retropulsed bony fragments. Intervertebral disc heights generally preserved, multilevel disc calcification. Mild osteopenia without destructive bony lesions. PARASPINAL AND OTHER SOFT TISSUES: Small to moderate RIGHT, small LEFT pleural effusions. Small prevertebral hematoma T5-6 -T7. Scattered ground-glass opacities RIGHT greater than LEFT lungs. Cardiomegaly and mild coronary artery calcifications incompletely assessed. DISC LEVELS: Multilevel mild RIGHT neural foraminal narrowing. No osseous canal stenosis. CT LUMBAR SPINE FINDINGS SEGMENTATION: For the purposes of this report the last well-formed intervertebral disc space is reported as L5-S1. ALIGNMENT: Maintained lumbar lordosis. Minimal L2-3 retrolisthesis, grade 1 L4-5 anterolisthesis. No spondylolysis. VERTEBRAE: Vertebral bodies and posterior elements are intact. Moderate L2-3 and L3-4 disc height loss with endplate spurring compatible with degenerative discs. Osteopenia without destructive bony lesions. PARASPINAL AND OTHER SOFT TISSUES: Included prevertebral and paraspinal soft tissues are nonsuspicious. Moderate aortoiliac atherosclerosis, and incompletely assessed. DISC LEVELS: L1-2: No disc bulge, canal stenosis nor neural foraminal narrowing. L2-3: Retrolisthesis. Moderate broad-based disc osteophyte complex. No canal stenosis. Mild neural foraminal narrowing. L3-4: Moderate broad-based disc osteophyte complex. Moderate facet arthropathy and ligamentum flavum redundancy. No canal stenosis. Mild to moderate neural foraminal narrowing. L4-5: Anterolisthesis. Small broad-based disc osteophyte complex. Severe facet arthropathy and ligamentum flavum redundancy. Mild canal stenosis. Mild to moderate neural foraminal narrowing. L5-S1: Small broad-based disc bulge. Moderate to severe facet  arthropathy without canal stenosis. Minimal neural foraminal narrowing. L3-4: IMPRESSION: CT THORACIC SPINE IMPRESSION 1. Acute T7 2 column fracture without height loss ; slight ventral widening consistent with hyperextension injury. No malalignment. 2. No osseous canal stenosis. Multilevel mild RIGHT neural foraminal narrowing . 3. Small to moderate RIGHT and small LEFT pleural effusions and findings of pulmonary edema. Recommend chest radiograph. CT LUMBAR SPINE IMPRESSION 1. No acute fracture. Minimal L2-3 retrolisthesis and grade 1 L4-5 anterolisthesis, no spondylolysis. 2. Mild canal stenosis L4-5. Neural foraminal narrowing L2-3 through L5-S1: Mild to moderate L3-4 and L4-5. Electronically Signed   By: Elon Alas M.D.   On: 02/22/2017 00:22   Ct Lumbar Spine Wo Contrast  Result Date: 02/22/2017 CLINICAL DATA:  [place] backwards well timing issues. Severe back pain. EXAM: CT THORACIC AND LUMBAR SPINE WITHOUT CONTRAST TECHNIQUE: Multidetector CT imaging of the thoracic and lumbar spine was performed without contrast. Multiplanar CT image reconstructions were also generated. COMPARISON:  Chest radiograph October 24, 2016 and lumbar spine radiographs October 24, 2016 FINDINGS: CT THORACIC SPINE FINDINGS ALIGNMENT: Thoracic  vertebral bodies are in alignment. Maintained thoracic lordosis. VERTEBRAE: Acute T7 2 column fracture with slight ventral widening. No retropulsed bony fragments. Intervertebral disc heights generally preserved, multilevel disc calcification. Mild osteopenia without destructive bony lesions. PARASPINAL AND OTHER SOFT TISSUES: Small to moderate RIGHT, small LEFT pleural effusions. Small prevertebral hematoma T5-6 -T7. Scattered ground-glass opacities RIGHT greater than LEFT lungs. Cardiomegaly and mild coronary artery calcifications incompletely assessed. DISC LEVELS: Multilevel mild RIGHT neural foraminal narrowing. No osseous canal stenosis. CT LUMBAR SPINE FINDINGS SEGMENTATION: For the  purposes of this report the last well-formed intervertebral disc space is reported as L5-S1. ALIGNMENT: Maintained lumbar lordosis. Minimal L2-3 retrolisthesis, grade 1 L4-5 anterolisthesis. No spondylolysis. VERTEBRAE: Vertebral bodies and posterior elements are intact. Moderate L2-3 and L3-4 disc height loss with endplate spurring compatible with degenerative discs. Osteopenia without destructive bony lesions. PARASPINAL AND OTHER SOFT TISSUES: Included prevertebral and paraspinal soft tissues are nonsuspicious. Moderate aortoiliac atherosclerosis, and incompletely assessed. DISC LEVELS: L1-2: No disc bulge, canal stenosis nor neural foraminal narrowing. L2-3: Retrolisthesis. Moderate broad-based disc osteophyte complex. No canal stenosis. Mild neural foraminal narrowing. L3-4: Moderate broad-based disc osteophyte complex. Moderate facet arthropathy and ligamentum flavum redundancy. No canal stenosis. Mild to moderate neural foraminal narrowing. L4-5: Anterolisthesis. Small broad-based disc osteophyte complex. Severe facet arthropathy and ligamentum flavum redundancy. Mild canal stenosis. Mild to moderate neural foraminal narrowing. L5-S1: Small broad-based disc bulge. Moderate to severe facet arthropathy without canal stenosis. Minimal neural foraminal narrowing. L3-4: IMPRESSION: CT THORACIC SPINE IMPRESSION 1. Acute T7 2 column fracture without height loss ; slight ventral widening consistent with hyperextension injury. No malalignment. 2. No osseous canal stenosis. Multilevel mild RIGHT neural foraminal narrowing . 3. Small to moderate RIGHT and small LEFT pleural effusions and findings of pulmonary edema. Recommend chest radiograph. CT LUMBAR SPINE IMPRESSION 1. No acute fracture. Minimal L2-3 retrolisthesis and grade 1 L4-5 anterolisthesis, no spondylolysis. 2. Mild canal stenosis L4-5. Neural foraminal narrowing L2-3 through L5-S1: Mild to moderate L3-4 and L4-5. Electronically Signed   By: Elon Alas M.D.   On: 02/22/2017 00:22    Procedures Procedures (including critical care time)  Medications Ordered in ED Medications  fentaNYL (SUBLIMAZE) injection 50 mcg (50 mcg Intravenous Given 02/21/17 2236)     Initial Impression / Assessment and Plan / ED Course  I have reviewed the triage vital signs and the nursing notes.  Pertinent labs & imaging results that were available during my care of the patient were reviewed by me and considered in my medical decision making (see chart for details).     Patient presenting after a mechanical fall while tying his shoe. States he went straight back and landed on his back. Unsure if he hit his head however has a loss of consciousness, altered mental status, nausea, vomiting since the event. He has tenderness to palpation along the C, T, and L-spine. Also on Eliquis. No paresthesias and has not walked since the event. We'll obtain imaging of the spine and head.   Imaging shows acute T7 fracture. CT of the C, L spine were negative and CT head negative. Considering patient on Eliquis, neurosurgery will be consulted for further evaluation. Neurosurgery evaluated patient in the emergency department and recommended hospitalist admission for observation overnight considering patient is on a liquids. Will be fitted for TLSO brace while an inpatient. Patient made aware of the plan. Stable while under my care in the emergency department.  Patient was seen with my attending, Dr. Jeanell Sparrow, who voiced agreement and  oversaw the evaluation and treatment of this patient.   Dragon Field seismologist was used in the creation of this note. If there are any errors or inconsistencies needing clarification, please contact me directly.   Final Clinical Impressions(s) / ED Diagnoses   Final diagnoses:  Fall, initial encounter    New Prescriptions New Prescriptions   No medications on file     Valda Lamb, MD 02/22/17 [id number]    Pattricia Boss,  MD 02/23/17 [phone]

## 2017-02-22 ENCOUNTER — Encounter (HOSPITAL_COMMUNITY): Payer: Self-pay | Admitting: Internal Medicine

## 2017-02-22 DIAGNOSIS — S22060A Wedge compression fracture of T7-T8 vertebra, initial encounter for closed fracture: Secondary | ICD-10-CM | POA: Diagnosis not present

## 2017-02-22 DIAGNOSIS — D649 Anemia, unspecified: Secondary | ICD-10-CM

## 2017-02-22 DIAGNOSIS — E119 Type 2 diabetes mellitus without complications: Secondary | ICD-10-CM

## 2017-02-22 DIAGNOSIS — I10 Essential (primary) hypertension: Secondary | ICD-10-CM

## 2017-02-22 LAB — CBC WITH DIFFERENTIAL/PLATELET
Basophils Absolute: 0 10*3/uL (ref 0.0–0.1)
Basophils Relative: 0 %
EOS ABS: 0.3 10*3/uL (ref 0.0–0.7)
EOS PCT: 5 %
HCT: 31.7 % — ABNORMAL LOW (ref 39.0–52.0)
HEMOGLOBIN: 9.5 g/dL — AB (ref 13.0–17.0)
LYMPHS ABS: 1.7 10*3/uL (ref 0.7–4.0)
Lymphocytes Relative: 25 %
MCH: 28.5 pg (ref 26.0–34.0)
MCHC: 30 g/dL (ref 30.0–36.0)
MCV: 95.2 fL (ref 78.0–100.0)
MONOS PCT: 11 %
Monocytes Absolute: 0.8 10*3/uL (ref 0.1–1.0)
NEUTROS PCT: 59 %
Neutro Abs: 4 10*3/uL (ref 1.7–7.7)
Platelets: 162 10*3/uL (ref 150–400)
RBC: 3.33 MIL/uL — ABNORMAL LOW (ref 4.22–5.81)
RDW: 14.9 % (ref 11.5–15.5)
WBC: 6.8 10*3/uL (ref 4.0–10.5)

## 2017-02-22 LAB — COMPREHENSIVE METABOLIC PANEL
ALK PHOS: 66 U/L (ref 38–126)
ALT: 10 U/L — ABNORMAL LOW (ref 17–63)
ANION GAP: 7 (ref 5–15)
AST: 20 U/L (ref 15–41)
Albumin: 3.1 g/dL — ABNORMAL LOW (ref 3.5–5.0)
BUN: 26 mg/dL — ABNORMAL HIGH (ref 6–20)
CO2: 27 mmol/L (ref 22–32)
Calcium: 8.7 mg/dL — ABNORMAL LOW (ref 8.9–10.3)
Chloride: 105 mmol/L (ref 101–111)
Creatinine, Ser: 1.69 mg/dL — ABNORMAL HIGH (ref 0.61–1.24)
GFR, EST AFRICAN AMERICAN: 45 mL/min — AB (ref >60)
GFR, EST NON AFRICAN AMERICAN: 39 mL/min — AB (ref >60)
Glucose, Bld: 104 mg/dL — ABNORMAL HIGH (ref 65–99)
Potassium: 4.1 mmol/L (ref 3.5–5.1)
SODIUM: 139 mmol/L (ref 135–145)
TOTAL PROTEIN: 6.1 g/dL — AB (ref 6.5–8.1)
Total Bilirubin: 1.1 mg/dL (ref 0.3–1.2)

## 2017-02-22 LAB — GLUCOSE, CAPILLARY
GLUCOSE-CAPILLARY: 91 mg/dL (ref 65–99)
GLUCOSE-CAPILLARY: 92 mg/dL (ref 65–99)
GLUCOSE-CAPILLARY: 130 mg/dL — AB (ref 65–99)
GLUCOSE-CAPILLARY: 114 mg/dL — AB (ref 65–99)
Glucose-Capillary: 116 mg/dL — ABNORMAL HIGH (ref 65–99)

## 2017-02-22 MED ORDER — INSULIN ASPART 100 UNIT/ML ~~LOC~~ SOLN: 0.0000 [IU] | Freq: Every day | SUBCUTANEOUS | Status: DC

## 2017-02-22 MED ORDER — APIXABAN 5 MG PO TABS
5.0000 mg | ORAL_TABLET | Freq: Two times a day (BID) | ORAL | Status: DC
  Administered 2017-02-22 – 2017-02-24 (×5): 5 mg via ORAL
  Filled 2017-02-22 (×5): qty 1

## 2017-02-22 MED ORDER — POTASSIUM CHLORIDE CRYS ER 10 MEQ PO TBCR
10.0000 meq | EXTENDED_RELEASE_TABLET | Freq: Every day | ORAL | Status: DC
  Administered 2017-02-22 – 2017-02-24 (×3): 10 meq via ORAL
  Filled 2017-02-22 (×3): qty 1

## 2017-02-22 MED ORDER — FUROSEMIDE 20 MG PO TABS
20.0000 mg | ORAL_TABLET | Freq: Every day | ORAL | Status: DC
  Administered 2017-02-22 – 2017-02-24 (×3): 20 mg via ORAL
  Filled 2017-02-22 (×3): qty 1

## 2017-02-22 MED ORDER — ADULT MULTIVITAMIN W/MINERALS CH
1.0000 | ORAL_TABLET | Freq: Every day | ORAL | Status: DC
  Administered 2017-02-22 – 2017-02-24 (×3): 1 via ORAL
  Filled 2017-02-22 (×3): qty 1

## 2017-02-22 MED ORDER — METOPROLOL SUCCINATE ER 25 MG PO TB24
75.0000 mg | ORAL_TABLET | Freq: Every day | ORAL | Status: DC
  Administered 2017-02-22: 09:00:00 75 mg via ORAL
  Filled 2017-02-22 (×3): qty 1

## 2017-02-22 MED ORDER — ATORVASTATIN CALCIUM 40 MG PO TABS
40.0000 mg | ORAL_TABLET | Freq: Every morning | ORAL | Status: DC
Start: 2017-02-22 — End: 2017-02-24
  Administered 2017-02-22 – 2017-02-24 (×3): 40 mg via ORAL
  Filled 2017-02-22 (×3): qty 1

## 2017-02-22 MED ORDER — SODIUM CHLORIDE 0.9% FLUSH
3.0000 mL | Freq: Two times a day (BID) | INTRAVENOUS | Status: DC
  Administered 2017-02-22 – 2017-02-23 (×4): 3 mL via INTRAVENOUS

## 2017-02-22 MED ORDER — VITAMIN D 1000 UNITS PO TABS
1000.0000 [IU] | ORAL_TABLET | Freq: Every day | ORAL | Status: DC
  Administered 2017-02-22 – 2017-02-24 (×3): 1000 [IU] via ORAL
  Filled 2017-02-22 (×3): qty 1

## 2017-02-22 MED ORDER — MORPHINE SULFATE (PF) 4 MG/ML IV SOLN
4.0000 mg | INTRAVENOUS | Status: DC | PRN
  Administered 2017-02-22 (×2): 4 mg via INTRAVENOUS
  Filled 2017-02-22 (×2): qty 1

## 2017-02-22 MED ORDER — INSULIN ASPART 100 UNIT/ML ~~LOC~~ SOLN
0.0000 [IU] | Freq: Three times a day (TID) | SUBCUTANEOUS | Status: DC
  Administered 2017-02-22: 1 [IU] via SUBCUTANEOUS

## 2017-02-22 MED ORDER — OXYCODONE HCL 5 MG PO TABS
5.0000 mg | ORAL_TABLET | ORAL | Status: DC | PRN
  Administered 2017-02-22: 5 mg via ORAL
  Filled 2017-02-22: qty 1

## 2017-02-22 MED ORDER — MORPHINE SULFATE (PF) 4 MG/ML IV SOLN: 4.0000 mg | INTRAVENOUS | Status: DC | PRN

## 2017-02-22 MED ORDER — DARIFENACIN HYDROBROMIDE ER 7.5 MG PO TB24
7.5000 mg | ORAL_TABLET | Freq: Every day | ORAL | Status: DC
Start: 2017-02-22 — End: 2017-02-24
  Administered 2017-02-22 – 2017-02-24 (×3): 7.5 mg via ORAL
  Filled 2017-02-22 (×3): qty 1

## 2017-02-22 MED ORDER — LISINOPRIL 20 MG PO TABS
20.0000 mg | ORAL_TABLET | Freq: Every day | ORAL | Status: DC
Start: 2017-02-22 — End: 2017-02-24
  Administered 2017-02-22 – 2017-02-23 (×2): 20 mg via ORAL
  Filled 2017-02-22 (×3): qty 1

## 2017-02-22 MED ORDER — METFORMIN HCL 500 MG PO TABS
500.0000 mg | ORAL_TABLET | Freq: Two times a day (BID) | ORAL | Status: DC
  Administered 2017-02-22 – 2017-02-23 (×4): 500 mg via ORAL
  Filled 2017-02-22 (×5): qty 1

## 2017-02-22 MED ORDER — ZOLPIDEM TARTRATE 5 MG PO TABS
5.0000 mg | ORAL_TABLET | Freq: Every evening | ORAL | Status: DC | PRN
  Administered 2017-02-22: 5 mg via ORAL
  Filled 2017-02-22: qty 1

## 2017-02-22 MED ORDER — OXYCODONE HCL 5 MG PO TABS
10.0000 mg | ORAL_TABLET | ORAL | Status: DC | PRN
  Filled 2017-02-22 (×2): qty 2

## 2017-02-22 MED ORDER — OXYCODONE HCL 5 MG PO TABS
5.0000 mg | ORAL_TABLET | ORAL | Status: DC | PRN
  Administered 2017-02-23 – 2017-02-24 (×3): 5 mg via ORAL
  Filled 2017-02-22 (×3): qty 1

## 2017-02-22 MED ORDER — SERTRALINE HCL 50 MG PO TABS
50.0000 mg | ORAL_TABLET | Freq: Every day | ORAL | Status: DC
  Administered 2017-02-22 – 2017-02-24 (×3): 50 mg via ORAL
  Filled 2017-02-22 (×3): qty 1

## 2017-02-22 NOTE — Progress Notes (Signed)
Pt wanted to get out of bed and transfer to chair. Upon sitting up, TLSO brace became too narrow to accommodate trunk with the shifting of pt's abdomen. Notified ortho tech with update to see if brace could be expanded any more. Received call back from Ridott with [employer] who said the current brace pt had would not be able to be adjusted any further. They would either have to try the largest "turtle shell" model they had, or pt would need to be fitted for a custom brace. Pt updated and information passed along to night shift RN. Will continue to monitor

## 2017-02-22 NOTE — H&P (Signed)
TRH H&P   Patient Demographics:    Alex Bowman, is a 71 y.o. male  MRN: [id number]   DOB - Jun 12, 1946  Admit Date - 02/21/2017  Outpatient Primary MD for the patient is [name], MD  Referring MD/NP/PA: Valda Lamb  Outpatient Specialists: neurosurgery  Patient coming from: home  Chief Complaint  Patient presents with  . Fall      HPI:    Alex Bowman  is a 71 y.o. male, Pafib was walking in the living room with his walker and fell backwards. Injuring his back.    In ED, CT brain negative,  CT T spine showed t7 compression fracture.  Pt has uncontrolled pain, neurosurgery consulted and brace ordered.  Pt will be admitted obs for back pain     Review of systems:    In addition to the HPI above,  No Fever-chills, No Headache, No changes with Vision or hearing, No problems swallowing food or Liquids, No Chest pain, Cough or Shortness of Breath, No Abdominal pain, No Nausea or Vommitting, Bowel movements are regular, No Blood in stool or Urine, No dysuria, No new skin rashes or bruises, No new joints pains-aches,  No new weakness, tingling, numbness in any extremity, No recent weight gain or loss, No polyuria, polydypsia or polyphagia, No significant Mental Stressors.  A full 10 point Review of Systems was done, except as stated above, all other Review of Systems were negative.   With Past History of the following :    Past Medical History:  Diagnosis Date  . Arthritis   . Bilateral lower extremity edema    Chronic, with venous insufficiency  . Cataract    beginning  . Chronic kidney disease    kidney stones  . Complication of anesthesia    stayed awake during colonoscopy  . Diabetes mellitus without complication ([place])    "on medicine, but numbers have been good for years since weight loss"  . Dyslipidemia    Monitored by Primary Physician. On  statin  . GERD (gastroesophageal reflux disease)   . Hypertension   . Knee pain    R>L  . Morbid obesity with BMI of 45.0-49.9, adult ([place])    Attempted weight loss efforts at last visit had BMI down to 42, unfortunately back up to 46 with weight going up to 312 lb from 285 lb  . Pneumonia 20 years ago   h/o      Past Surgical History:  Procedure Laterality Date  . 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 Mild elevation in LVDP, EF 55%. --> False-positive stress test  . CARDIOVERSION N/A 11/27/2016   Procedure: CARDIOVERSION;  Surgeon: Skeet Latch, MD;  Location: [facility];  Service: Cardiovascular;  Laterality: N/A;  . COLONOSCOPY    . CYSTOSCOPY WITH STENT PLACEMENT Left 04/19/2013   Procedure: CYSTOSCOPY WITH  STENT PLACEMENT;  Surgeon: Alexis Frock, MD;  Location: [facility];  Service: Urology;  Laterality: Left;  RIGHT RETROGRADE PYELOGRAM  . CYSTOSCOPY WITH URETEROSCOPY Left 04/21/2013   Procedure: CYSTOSCOPY WITH URETEROSCOPY bilateral retrograde, bilateral stent change, stone extraction;  Surgeon: Alexis Frock, MD;  Location: [facility];  Service: Urology;  Laterality: Left;  bilateral ureters  . HAND SURGERY Bilateral 15 years ago  . HOLMIUM LASER APPLICATION Right 7/42/5956   Procedure: HOLMIUM LASER APPLICATION;  Surgeon: Alexis Frock, MD;  Location: [facility];  Service: Urology;  Laterality: Right;  . NEPHROLITHOTOMY Right 04/19/2013   Procedure: NEPHROLITHOTOMY PERCUTANEOUS;  Surgeon: Alexis Frock, MD;  Location: [facility];  Service: Urology;  Laterality: Right;  . NEPHROLITHOTOMY Right 04/21/2013   Procedure: NEPHROLITHOTOMY PERCUTANEOUS RIGHT 2ND STAGE PERCUTANEOUS NEPHROLITHOTOMY;  Surgeon: Alexis Frock, MD;  Location: [facility];  Service: Urology;  Laterality: Right;  . NM MYOVIEW LTD; [place]  May 2012   Suggested as high risk with 3 times a day 1.2 to, mild to moderate 3 times a day affecting basolateral and mid lateral walls.    [name] POLYPECTOMY        Social History:     Social History  Substance Use Topics  . Smoking status: Former Smoker    Packs/day: 1.00    Years: 30.00    Types: Cigarettes    Quit date: 03/02/1997  . Smokeless tobacco: Former Systems developer    Types: Chew    Quit date: 08/12/2002  . Alcohol use No     Lives - at home  Mobility - walks with walker     Family History :     Family History  Problem Relation Age of Onset  . Breast cancer Mother   . Prostate cancer Father   . Colon cancer Neg Hx       Home Medications:   Prior to Admission medications   Medication Sig Start Date End Date Taking? Authorizing Provider  apixaban (ELIQUIS) 5 MG TABS tablet Take 1 tablet (5 mg total) by mouth 2 (two) times daily. 10/30/16   Sherran Needs, NP  atorvastatin (LIPITOR) 40 MG tablet Take 40 mg by mouth every morning.    [provider]  cholecalciferol (VITAMIN D) 1000 UNITS tablet Take 1,000 Units by mouth daily.    [provider]  HYDROcodone-acetaminophen (NORCO) 10-325 MG per tablet Take 1 tablet by mouth every 6 (six) hours as needed for pain. 04/26/13   Alexis Frock, MD  hydrOXYzine (ATARAX/VISTARIL) 10 MG tablet 1 tablet daily as needed. 10/23/16   [provider]  lisinopril (PRINIVIL,ZESTRIL) 20 MG tablet TAKE 1 TABLET BY MOUTH TWICE DAILY 07/11/14   [provider]  metFORMIN (GLUCOPHAGE) 500 MG tablet Take 500 mg by mouth 2 (two) times daily with a meal.    [provider]  metoprolol succinate (TOPROL-XL) 50 MG 24 hr tablet Take 75mg  (1 and 1/2 tablets) twice a day by mouth 12/04/16   Sherran Needs, NP  Multiple Vitamin (MULTIVITAMIN WITH MINERALS) TABS tablet Take 1 tablet by mouth daily.    [provider]  sertraline (ZOLOFT) 50 MG tablet 1 tablet daily. 07/24/16   [provider]  solifenacin (VESICARE) 5 MG tablet Take 5 mg by mouth daily.    [provider]     Allergies:    No Known Allergies    Physical Exam:   Vitals  Blood pressure (!) 142/70, pulse (!) 59, temperature 98.1 F (36.7 C), resp. rate 18, SpO2  99 %.   1. General  lying in bed in NAD,    2. Normal affect and insight, Not Suicidal or Homicidal, Awake Alert, Oriented X 3.  3. No F.N deficits, ALL C.Nerves Intact, Strength 5/5 all 4 extremities, Sensation intact all 4 extremities, Plantars down going.  4. Ears and Eyes appear Normal, Conjunctivae clear, PERRLA. Moist Oral Mucosa.  5. Supple Neck, No JVD, No cervical lymphadenopathy appriciated, No Carotid Bruits.  6. Symmetrical Chest wall movement, Good air movement bilaterally, CTAB.  7. RRR, No Gallops, Rubs or Murmurs, No Parasternal Heave.  8. Positive Bowel Sounds, Abdomen Soft, No tenderness, No organomegaly appriciated,No rebound -guarding or rigidity.  9.  No Cyanosis, Normal Skin Turgor, No Skin Rash or Bruise.  10. Good muscle tone,  joints appear normal , no effusions, Normal ROM.  11. No Palpable Lymph Nodes in Neck or Axillae     Data Review:    CBC  Recent Labs Lab 02/21/17 2208  WBC 7.4  HGB 10.2*  HCT 33.9*  PLT 164  MCV 95.5  MCH 28.7  MCHC 30.1  RDW 14.9  LYMPHSABS 1.7  MONOABS 0.7  EOSABS 0.3  BASOSABS 0.0   ------------------------------------------------------------------------------------------------------------------  Chemistries   Recent Labs Lab 02/21/17 2208  NA 139  K 4.9  CL 102  CO2 26  GLUCOSE 106*  BUN 28*  CREATININE 1.87*  CALCIUM 9.1   ------------------------------------------------------------------------------------------------------------------ CrCl cannot be calculated (Unknown ideal weight.). ------------------------------------------------------------------------------------------------------------------ No results for input(s): TSH, T4TOTAL, T3FREE, THYROIDAB in the last 72 hours.  Invalid input(s): FREET3  Coagulation profile No results for input(s): INR, PROTIME in the last  168 hours. ------------------------------------------------------------------------------------------------------------------- No results for input(s): DDIMER in the last 72 hours. -------------------------------------------------------------------------------------------------------------------  Cardiac Enzymes No results for input(s): CKMB, TROPONINI, MYOGLOBIN in the last 168 hours.  Invalid input(s): CK ------------------------------------------------------------------------------------------------------------------ No results found for: BNP   ---------------------------------------------------------------------------------------------------------------  Urinalysis    Component Value Date/Time   COLORURINE YELLOW 10/24/2016 1357   APPEARANCEUR CLEAR 10/24/2016 1357   LABSPEC 1.017 10/24/2016 1357   PHURINE 5.0 10/24/2016 1357   GLUCOSEU NEGATIVE 10/24/2016 1357   HGBUR NEGATIVE 10/24/2016 1357   [place] 10/24/2016 1357   [place] 10/24/2016 1357   PROTEINUR NEGATIVE 10/24/2016 1357   UROBILINOGEN 0.2 04/23/2013 1619   NITRITE NEGATIVE 10/24/2016 1357   LEUKOCYTESUR TRACE (A) 10/24/2016 1357    ----------------------------------------------------------------------------------------------------------------   Imaging Results:    Ct Head Wo Contrast  Result Date: 02/21/2017 CLINICAL DATA:  Fall with pain EXAM: CT HEAD WITHOUT CONTRAST CT CERVICAL SPINE WITHOUT CONTRAST TECHNIQUE: Multidetector CT imaging of the head and cervical spine was performed following the standard protocol without intravenous contrast. Multiplanar CT image reconstructions of the cervical spine were also generated. COMPARISON:  10/24/2016 FINDINGS: CT HEAD FINDINGS Brain: No acute territorial infarction, hemorrhage or intracranial mass is seen. There is motion degradation. Mild to moderate atrophy. Mild small vessel ischemic changes of the white matter. Nonenlarged ventricles.  Vascular: No hyperdense vessels.  Carotid artery calcifications. Skull: No depressed skull fracture Sinuses/Orbits: Mucosal thickening in the ethmoid sinuses. No acute orbital abnormality. Other: None CT CERVICAL SPINE FINDINGS Alignment: Motion degradation limits the exam. Straightening of the cervical spine. Facet alignment grossly normal. Skull base and vertebrae: Craniovertebral junction is intact. No obvious fracture is seen. Soft tissues and spinal canal: Prominent prevertebral soft tissues. No visible canal hematoma Disc levels: Moderate severe multilevel degenerative disc changes from C3 through C7. Diffuse canal stenosis of the mid to lower cervical spine. Multilevel  facet degenerative changes with multiple levels of foraminal stenosis. Upper chest: Partially visualized right pleural effusion. No thyroid mass. Other: None IMPRESSION: 1. Motion degradation 2. No definite CT evidence for acute intracranial abnormality 3. Multilevel advanced degenerative changes of the spine. No obvious fracture. Electronically Signed   By: Donavan Foil M.D.   On: 02/21/2017 23:55   Ct Cervical Spine Wo Contrast  Result Date: 02/21/2017 CLINICAL DATA:  Fall with pain EXAM: CT HEAD WITHOUT CONTRAST CT CERVICAL SPINE WITHOUT CONTRAST TECHNIQUE: Multidetector CT imaging of the head and cervical spine was performed following the standard protocol without intravenous contrast. Multiplanar CT image reconstructions of the cervical spine were also generated. COMPARISON:  10/24/2016 FINDINGS: CT HEAD FINDINGS Brain: No acute territorial infarction, hemorrhage or intracranial mass is seen. There is motion degradation. Mild to moderate atrophy. Mild small vessel ischemic changes of the white matter. Nonenlarged ventricles. Vascular: No hyperdense vessels.  Carotid artery calcifications. Skull: No depressed skull fracture Sinuses/Orbits: Mucosal thickening in the ethmoid sinuses. No acute orbital abnormality. Other: None CT CERVICAL  SPINE FINDINGS Alignment: Motion degradation limits the exam. Straightening of the cervical spine. Facet alignment grossly normal. Skull base and vertebrae: Craniovertebral junction is intact. No obvious fracture is seen. Soft tissues and spinal canal: Prominent prevertebral soft tissues. No visible canal hematoma Disc levels: Moderate severe multilevel degenerative disc changes from C3 through C7. Diffuse canal stenosis of the mid to lower cervical spine. Multilevel facet degenerative changes with multiple levels of foraminal stenosis. Upper chest: Partially visualized right pleural effusion. No thyroid mass. Other: None IMPRESSION: 1. Motion degradation 2. No definite CT evidence for acute intracranial abnormality 3. Multilevel advanced degenerative changes of the spine. No obvious fracture. Electronically Signed   By: Donavan Foil M.D.   On: 02/21/2017 23:55   Ct Thoracic Spine Wo Contrast  Result Date: 02/22/2017 CLINICAL DATA:  [place] backwards well timing issues. Severe back pain. EXAM: CT THORACIC AND LUMBAR SPINE WITHOUT CONTRAST TECHNIQUE: Multidetector CT imaging of the thoracic and lumbar spine was performed without contrast. Multiplanar CT image reconstructions were also generated. COMPARISON:  Chest radiograph October 24, 2016 and lumbar spine radiographs October 24, 2016 FINDINGS: CT THORACIC SPINE FINDINGS ALIGNMENT: Thoracic vertebral bodies are in alignment. Maintained thoracic lordosis. VERTEBRAE: Acute T7 2 column fracture with slight ventral widening. No retropulsed bony fragments. Intervertebral disc heights generally preserved, multilevel disc calcification. Mild osteopenia without destructive bony lesions. PARASPINAL AND OTHER SOFT TISSUES: Small to moderate RIGHT, small LEFT pleural effusions. Small prevertebral hematoma T5-6 -T7. Scattered ground-glass opacities RIGHT greater than LEFT lungs. Cardiomegaly and mild coronary artery calcifications incompletely assessed. DISC LEVELS: Multilevel  mild RIGHT neural foraminal narrowing. No osseous canal stenosis. CT LUMBAR SPINE FINDINGS SEGMENTATION: For the purposes of this report the last well-formed intervertebral disc space is reported as L5-S1. ALIGNMENT: Maintained lumbar lordosis. Minimal L2-3 retrolisthesis, grade 1 L4-5 anterolisthesis. No spondylolysis. VERTEBRAE: Vertebral bodies and posterior elements are intact. Moderate L2-3 and L3-4 disc height loss with endplate spurring compatible with degenerative discs. Osteopenia without destructive bony lesions. PARASPINAL AND OTHER SOFT TISSUES: Included prevertebral and paraspinal soft tissues are nonsuspicious. Moderate aortoiliac atherosclerosis, and incompletely assessed. DISC LEVELS: L1-2: No disc bulge, canal stenosis nor neural foraminal narrowing. L2-3: Retrolisthesis. Moderate broad-based disc osteophyte complex. No canal stenosis. Mild neural foraminal narrowing. L3-4: Moderate broad-based disc osteophyte complex. Moderate facet arthropathy and ligamentum flavum redundancy. No canal stenosis. Mild to moderate neural foraminal narrowing. L4-5: Anterolisthesis. Small broad-based disc osteophyte complex. Severe facet arthropathy  and ligamentum flavum redundancy. Mild canal stenosis. Mild to moderate neural foraminal narrowing. L5-S1: Small broad-based disc bulge. Moderate to severe facet arthropathy without canal stenosis. Minimal neural foraminal narrowing. L3-4: IMPRESSION: CT THORACIC SPINE IMPRESSION 1. Acute T7 2 column fracture without height loss ; slight ventral widening consistent with hyperextension injury. No malalignment. 2. No osseous canal stenosis. Multilevel mild RIGHT neural foraminal narrowing . 3. Small to moderate RIGHT and small LEFT pleural effusions and findings of pulmonary edema. Recommend chest radiograph. CT LUMBAR SPINE IMPRESSION 1. No acute fracture. Minimal L2-3 retrolisthesis and grade 1 L4-5 anterolisthesis, no spondylolysis. 2. Mild canal stenosis L4-5. Neural  foraminal narrowing L2-3 through L5-S1: Mild to moderate L3-4 and L4-5. Electronically Signed   By: Elon Alas M.D.   On: 02/22/2017 00:22   Ct Lumbar Spine Wo Contrast  Result Date: 02/22/2017 CLINICAL DATA:  [place] backwards well timing issues. Severe back pain. EXAM: CT THORACIC AND LUMBAR SPINE WITHOUT CONTRAST TECHNIQUE: Multidetector CT imaging of the thoracic and lumbar spine was performed without contrast. Multiplanar CT image reconstructions were also generated. COMPARISON:  Chest radiograph October 24, 2016 and lumbar spine radiographs October 24, 2016 FINDINGS: CT THORACIC SPINE FINDINGS ALIGNMENT: Thoracic vertebral bodies are in alignment. Maintained thoracic lordosis. VERTEBRAE: Acute T7 2 column fracture with slight ventral widening. No retropulsed bony fragments. Intervertebral disc heights generally preserved, multilevel disc calcification. Mild osteopenia without destructive bony lesions. PARASPINAL AND OTHER SOFT TISSUES: Small to moderate RIGHT, small LEFT pleural effusions. Small prevertebral hematoma T5-6 -T7. Scattered ground-glass opacities RIGHT greater than LEFT lungs. Cardiomegaly and mild coronary artery calcifications incompletely assessed. DISC LEVELS: Multilevel mild RIGHT neural foraminal narrowing. No osseous canal stenosis. CT LUMBAR SPINE FINDINGS SEGMENTATION: For the purposes of this report the last well-formed intervertebral disc space is reported as L5-S1. ALIGNMENT: Maintained lumbar lordosis. Minimal L2-3 retrolisthesis, grade 1 L4-5 anterolisthesis. No spondylolysis. VERTEBRAE: Vertebral bodies and posterior elements are intact. Moderate L2-3 and L3-4 disc height loss with endplate spurring compatible with degenerative discs. Osteopenia without destructive bony lesions. PARASPINAL AND OTHER SOFT TISSUES: Included prevertebral and paraspinal soft tissues are nonsuspicious. Moderate aortoiliac atherosclerosis, and incompletely assessed. DISC LEVELS: L1-2: No disc bulge,  canal stenosis nor neural foraminal narrowing. L2-3: Retrolisthesis. Moderate broad-based disc osteophyte complex. No canal stenosis. Mild neural foraminal narrowing. L3-4: Moderate broad-based disc osteophyte complex. Moderate facet arthropathy and ligamentum flavum redundancy. No canal stenosis. Mild to moderate neural foraminal narrowing. L4-5: Anterolisthesis. Small broad-based disc osteophyte complex. Severe facet arthropathy and ligamentum flavum redundancy. Mild canal stenosis. Mild to moderate neural foraminal narrowing. L5-S1: Small broad-based disc bulge. Moderate to severe facet arthropathy without canal stenosis. Minimal neural foraminal narrowing. L3-4: IMPRESSION: CT THORACIC SPINE IMPRESSION 1. Acute T7 2 column fracture without height loss ; slight ventral widening consistent with hyperextension injury. No malalignment. 2. No osseous canal stenosis. Multilevel mild RIGHT neural foraminal narrowing . 3. Small to moderate RIGHT and small LEFT pleural effusions and findings of pulmonary edema. Recommend chest radiograph. CT LUMBAR SPINE IMPRESSION 1. No acute fracture. Minimal L2-3 retrolisthesis and grade 1 L4-5 anterolisthesis, no spondylolysis. 2. Mild canal stenosis L4-5. Neural foraminal narrowing L2-3 through L5-S1: Mild to moderate L3-4 and L4-5. Electronically Signed   By: Elon Alas M.D.   On: 02/22/2017 00:22     Assessment & Plan:    Principal Problem:   Closed wedge compression fracture of T7 vertebra (HCC) Active Problems:   Hypertension   Diabetes mellitus without complication (HCC)   Persistent atrial  fibrillation ([place])   Anemia    T7 compression fracture Back brace Neurosurgery consult appreciated  Hypertension Cont lisinopril  Hyperlipidemia Cont lipitor  Dm2 Cont metformin,  fsbs ac and qhs, iss  Pafib Cont metoprolol,  Cont eliquis     DVT Prophylaxis on eilquis   AM Labs Ordered, also please review Full Orders  Family Communication:  Admission, patients condition and plan of care including tests being ordered have been discussed with the patient  who indicate understanding and agree with the plan and Code Status.  Code Status FULL CODE  Likely DC to  home  Condition GUARDED    Consults called: neurosurgery by ED  Admission status: observation  Time spent in minutes : 45   Jani Gravel M.D on 02/22/2017 at 2:01 AM  Between 7pm to 7am - Pager - [phone]. After 7am go to [email or web address] - password [facility]  [facility] - Office  [phone]

## 2017-02-22 NOTE — Consult Note (Signed)
CC:  Chief Complaint  Patient presents with  . Fall   HPI: Alex Bowman is a 71 y.o. male who presented to ER via EMS after mechanical fall at home. Fell backwards landing on back. No head trauma. No LOC. Unable to get up off the floor so his wife called EMS. Continues to have diffuse back pain. Moderate in severity. Pain worsened with movement. Feels well other than back pain. Denies numbness/tingling or weakness of the extremities. Denies bowel/bladder dysfunction. Is on Eliquis.   PMH: Past Medical History:  Diagnosis Date  . Arthritis   . Bilateral lower extremity edema    Chronic, with venous insufficiency  . Cataract    beginning  . Chronic kidney disease    kidney stones  . Complication of anesthesia    stayed awake during colonoscopy  . Diabetes mellitus without complication ([place])    "on medicine, but numbers have been good for years since weight loss"  . Dyslipidemia    Monitored by Primary Physician. On statin  . GERD (gastroesophageal reflux disease)   . Hypertension   . Knee pain    R>L  . Morbid obesity with BMI of 45.0-49.9, adult ([place])    Attempted weight loss efforts at last visit had BMI down to 42, unfortunately back up to 46 with weight going up to 312 lb from 285 lb  . Pneumonia 20 years ago   h/o    PSH: Past Surgical History:  Procedure Laterality Date  . 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 Mild elevation in LVDP, EF 55%. --> False-positive stress test  . CARDIOVERSION N/A 11/27/2016   Procedure: CARDIOVERSION;  Surgeon: Skeet Latch, MD;  Location: [facility];  Service: Cardiovascular;  Laterality: N/A;  . COLONOSCOPY    . CYSTOSCOPY WITH STENT PLACEMENT Left 04/19/2013   Procedure: CYSTOSCOPY WITH STENT PLACEMENT;  Surgeon: Alexis Frock, MD;  Location: [facility];  Service: Urology;  Laterality: Left;  RIGHT RETROGRADE PYELOGRAM  . CYSTOSCOPY WITH URETEROSCOPY Left 04/21/2013   Procedure:  CYSTOSCOPY WITH URETEROSCOPY bilateral retrograde, bilateral stent change, stone extraction;  Surgeon: Alexis Frock, MD;  Location: [facility];  Service: Urology;  Laterality: Left;  bilateral ureters  . HAND SURGERY Bilateral 15 years ago  . HOLMIUM LASER APPLICATION Right 1/63/8466   Procedure: HOLMIUM LASER APPLICATION;  Surgeon: Alexis Frock, MD;  Location: [facility];  Service: Urology;  Laterality: Right;  . NEPHROLITHOTOMY Right 04/19/2013   Procedure: NEPHROLITHOTOMY PERCUTANEOUS;  Surgeon: Alexis Frock, MD;  Location: [facility];  Service: Urology;  Laterality: Right;  . NEPHROLITHOTOMY Right 04/21/2013   Procedure: NEPHROLITHOTOMY PERCUTANEOUS RIGHT 2ND STAGE PERCUTANEOUS NEPHROLITHOTOMY;  Surgeon: Alexis Frock, MD;  Location: [facility];  Service: Urology;  Laterality: Right;  . NM MYOVIEW LTD; [place]  May 2012   Suggested as high risk with 3 times a day 1.2 to, mild to moderate 3 times a day affecting basolateral and mid lateral walls.  [name] POLYPECTOMY      SH: Social History  Substance Use Topics  . Smoking status: Former Smoker    Packs/day: 1.00    Years: 30.00    Types: Cigarettes    Quit date: 03/02/1997  . Smokeless tobacco: Former Systems developer    Types: Chew    Quit date: 08/12/2002  . Alcohol use No    MEDS: Prior to Admission medications   Medication Sig Start Date End Date Taking? Authorizing Provider  apixaban (ELIQUIS) 5 MG TABS tablet  Take 1 tablet (5 mg total) by mouth 2 (two) times daily. 10/30/16   Sherran Needs, NP  atorvastatin (LIPITOR) 40 MG tablet Take 40 mg by mouth every morning.    [provider]  cholecalciferol (VITAMIN D) 1000 UNITS tablet Take 1,000 Units by mouth daily.    [provider]  HYDROcodone-acetaminophen (NORCO) 10-325 MG per tablet Take 1 tablet by mouth every 6 (six) hours as needed for pain. 04/26/13   Alexis Frock, MD  hydrOXYzine (ATARAX/VISTARIL) 10 MG tablet 1 tablet daily as needed. 10/23/16   [provider]   lisinopril (PRINIVIL,ZESTRIL) 20 MG tablet TAKE 1 TABLET BY MOUTH TWICE DAILY 07/11/14   [provider]  metFORMIN (GLUCOPHAGE) 500 MG tablet Take 500 mg by mouth 2 (two) times daily with a meal.    [provider]  metoprolol succinate (TOPROL-XL) 50 MG 24 hr tablet Take 75mg  (1 and 1/2 tablets) twice a day by mouth 12/04/16   Sherran Needs, NP  Multiple Vitamin (MULTIVITAMIN WITH MINERALS) TABS tablet Take 1 tablet by mouth daily.    [provider]  sertraline (ZOLOFT) 50 MG tablet 1 tablet daily. 07/24/16   [provider]  solifenacin (VESICARE) 5 MG tablet Take 5 mg by mouth daily.    [provider]    ALLERGY: No Known Allergies  ROS: Review of Systems  Constitutional: Negative.   HENT: Negative.   Eyes: Negative.   Respiratory: Negative.   Cardiovascular: Negative.   Gastrointestinal: Negative for nausea and vomiting.  Genitourinary: Negative.   Musculoskeletal: Positive for back pain and falls.  Skin: Negative.   Neurological: Negative for dizziness, tingling, tremors, sensory change, speech change, focal weakness, seizures, loss of consciousness and headaches.  Endo/Heme/Allergies: Bruises/bleeds easily.    Vitals:   02/21/17 2024 02/21/17 2230  BP: (!) 151/64 (!) 142/70  Pulse: (!) 50 (!) 59  Resp: 16 18  Temp: 98.1 F (36.7 C)    General appearance: WDWN, resting comfortably, NAD  Eyes: PERRL, Fundoscopic: normal Cardiovascular: Regular rate and rhythm without murmurs, rubs, gallops. No edema or variciosities. Distal pulses normal. Pulmonary: Clear to auscultation Musculoskeletal:     Muscle tone upper extremities: Normal    Muscle tone lower extremities: Normal    Motor exam: Upper Extremities Deltoid Bicep Tricep Grip  Right 5/5 5/5 5/5 5/5  Left 5/5 5/5 5/5 5/5   Lower Extremity IP Quad PF DF EHL  Right 5/5 5/5 5/5 5/5 5/5  Left 5/5 5/5 5/5 5/5 5/5   Neurological Awake, alert, oriented Memory and  concentration grossly intact Speech fluent, appropriate CNII: Visual fields normal CNIII/IV/VI: EOMI CNV: Facial sensation normal CNVII: Symmetric, normal strength CNVIII: Grossly normal CNIX: Normal palate movement CNXI: Trap and SCM strength normal CN XII: Tongue protrusion normal Sensation grossly intact to LT DTR: Normal  IMAGING: CT T/L Spine IMPRESSION: CT THORACIC SPINE IMPRESSION  1. Acute T7 2 column fracture without height loss ; slight ventral widening consistent with hyperextension injury. No malalignment. 2. No osseous canal stenosis. Multilevel mild RIGHT neural foraminal narrowing . 3. Small to moderate RIGHT and small LEFT pleural effusions and findings of pulmonary edema. Recommend chest radiograph.  CT LUMBAR SPINE IMPRESSION  1. No acute fracture. Minimal L2-3 retrolisthesis and grade 1 L4-5 anterolisthesis, no spondylolysis. 2. Mild canal stenosis L4-5. Neural foraminal narrowing L2-3 through L5-S1: Mild to moderate L3-4 and L4-5.  IMPRESSION/PLAN - 71 y.o. male with acute T7 fracture. No malalignment. No retropulsion. He is neurologically  intact and is without radicular symptoms. No surgical intervention indicated. Should heal with time. TLSO brace for support. Because he is on Eliquis, I rec he be admitted under hospitalist service for monitoring and pain control. Will continue to follow patient. May consider upright Xrays once placed in TLSO.

## 2017-02-22 NOTE — ED Notes (Signed)
EDP at bedside  

## 2017-02-22 NOTE — Progress Notes (Signed)
Orthopedic Tech Progress Note Patient Details:  Alex Bowman [birth year] [id number]  Patient ID: [name], male   DOB: March 20, 1946, 70 y.o.   MRN: [id number]   [place] Pink 02/22/2017, 9:21 AMCalled [employer] for TLSO brace.

## 2017-02-22 NOTE — Progress Notes (Signed)
Patient admitted through ED with a fall. On arrival, patient alert and oriented. Not in any distress or SOB. Made him comfortable in bed and oriented him to the room. Placed on cardiac monitor and safety measures put in place.Will keep monitoring.

## 2017-02-22 NOTE — Progress Notes (Signed)
[facility]  I have reviewed the H and P and evaluated the patient.   A/P - T7 compression fracture due to mechanical fall. - conservative management recommended by NS - cont Back brace and attempts at pain control and ambulation with back brace - have started low dose Oxycodone  PAF - currently sinus brady - cont Eliquis and Toprol  CKD 3 - follow Cr closely - on Lasix and Lisinopril   DM2 - cont Metformin and SSI  HTN - Lisinopril, Lasix, Toprol  Morbid obesity Body mass index is 45.84 kg/m.   Debbe Odea, MD Pager: [email or web address]

## 2017-02-22 NOTE — Progress Notes (Signed)
Orthopedic Tech Progress Note Patient Details:  Alex Bowman 06-07-1946 [id number]  Patient ID: [name], male   DOB: 02/21/1946, 71 y.o.   MRN: [id number]   [place] Pink 02/22/2017, 6:55 PMCalled [employer] for brace adjustments.

## 2017-02-22 NOTE — Discharge Instructions (Signed)

## 2017-02-22 NOTE — Progress Notes (Addendum)
Pt seen and examined.  No issues since this am Continues to have back pain as expected Denies N/T, radicular symptoms, weakness of extremities. Family has some concerns about him coming home. They do not feel as though they can care for him. Lives with wife who recently had pelvis surgery. Would like him to be considered for SNF  EXAM: Temp:  [98 F (36.7 C)-98.1 F (36.7 C)] 98 F (36.7 C) (07/14 0405) Pulse Rate:  [50-59] 59 (07/14 0405) Resp:  [16-18] 16 (07/14 0405) BP: (142-151)/(51-70) 145/51 (07/14 0405) SpO2:  [96 %-99 %] 96 % (07/14 0405) Weight:  [144.9 kg (319 lb 7.1 oz)] 144.9 kg (319 lb 7.1 oz) (07/14 0902) Intake/Output      07/13 0701 - 07/14 0700 07/14 0701 - 07/15 0700   Urine (mL/kg/hr)  650 (0.6)   Total Output   650   Net   -650         Awake and alert Follows commands throughout Full strength Sensation grossly intact to LT  Plan Neuro exam stable. Continue TLSO brace with upright and OOB. Technically cleared for discharge from NS standpoint based on normal exam, but family is concerned about him returning home. Will consult PT/OT for consideration of rehab. Continue to monitor overnight

## 2017-02-23 DIAGNOSIS — I10 Essential (primary) hypertension: Secondary | ICD-10-CM | POA: Diagnosis not present

## 2017-02-23 DIAGNOSIS — G4733 Obstructive sleep apnea (adult) (pediatric): Secondary | ICD-10-CM | POA: Diagnosis not present

## 2017-02-23 DIAGNOSIS — I480 Paroxysmal atrial fibrillation: Secondary | ICD-10-CM | POA: Diagnosis not present

## 2017-02-23 DIAGNOSIS — E8881 Metabolic syndrome: Secondary | ICD-10-CM | POA: Diagnosis not present

## 2017-02-23 DIAGNOSIS — R600 Localized edema: Secondary | ICD-10-CM | POA: Diagnosis not present

## 2017-02-23 DIAGNOSIS — S22060A Wedge compression fracture of T7-T8 vertebra, initial encounter for closed fracture: Secondary | ICD-10-CM | POA: Diagnosis not present

## 2017-02-23 DIAGNOSIS — N183 Chronic kidney disease, stage 3 (moderate): Secondary | ICD-10-CM | POA: Diagnosis not present

## 2017-02-23 DIAGNOSIS — E785 Hyperlipidemia, unspecified: Secondary | ICD-10-CM | POA: Diagnosis not present

## 2017-02-23 DIAGNOSIS — I481 Persistent atrial fibrillation: Secondary | ICD-10-CM | POA: Diagnosis not present

## 2017-02-23 DIAGNOSIS — E1122 Type 2 diabetes mellitus with diabetic chronic kidney disease: Secondary | ICD-10-CM | POA: Diagnosis not present

## 2017-02-23 LAB — GLUCOSE, CAPILLARY
Glucose-Capillary: 102 mg/dL — ABNORMAL HIGH (ref 65–99)
Glucose-Capillary: 106 mg/dL — ABNORMAL HIGH (ref 65–99)
Glucose-Capillary: 123 mg/dL — ABNORMAL HIGH (ref 65–99)
Glucose-Capillary: 122 mg/dL — ABNORMAL HIGH (ref 65–99)

## 2017-02-23 MED ORDER — SENNA 8.6 MG PO TABS
2.0000 | ORAL_TABLET | Freq: Every day | ORAL | Status: DC
  Administered 2017-02-23: 17.2 mg via ORAL
  Filled 2017-02-23: qty 2

## 2017-02-23 MED ORDER — POLYETHYLENE GLYCOL 3350 17 G PO PACK
17.0000 g | PACK | Freq: Every day | ORAL | Status: DC
  Filled 2017-02-23: qty 1

## 2017-02-23 NOTE — Progress Notes (Signed)
PROGRESS NOTE    Alex Bowman   WFU:[id number]  DOB: Jan 06, 1946  DOA: 02/21/2017 PCP: [name], MD   Brief Narrative:  Alex Bowman  is a 71 y.o. male, Pafib was walking in the living room with his walker and fell backwards. Injuring his back.    In ED, CT brain negative,  CT T spine showed t7 compression fracture.  Pt has uncontrolled pain, neurosurgery consulted and brace ordered.  Pt will be admitted obs for back pain    Subjective: Moderate back pain which is severe when ambulating.   Assessment & Plan:  T7 compression fracture due to mechanical fall. - conservative management recommended by NS - cont Back brace and attempts at pain control and ambulation with back brace - have started Oxycodone  PAF - currently sinus brady - cont Eliquis and Toprol  CKD 3 - follow Cr closely - on Lasix and Lisinopril   DM2 - cont Metformin and SSI  HTN - Lisinopril, Lasix, Toprol  Morbid obesity Body mass index is 45.84 kg/m.    DVT prophylaxis: eliquis Code Status: full code Family Communication: sister in law Disposition Plan: SNF tomorrow if possible Consultants:   NS Procedures:    Antimicrobials:  Anti-infectives    None       Objective: Vitals:   02/22/17 2020 02/23/17 0307 02/23/17 1020 02/23/17 1300  BP: (!) 123/54 (!) 149/49 (!) 145/52 (!) 143/65  Pulse: (!) 46 (!) 46 (!) 52 63  Resp: 20 18 18 18   Temp: 98.2 F (36.8 C) 97.9 F (36.6 C)  97.9 F (36.6 C)  TempSrc: Oral Oral  Oral  SpO2: 95% 100% 100% 100%  Weight:      Height:        Intake/Output Summary (Last 24 hours) at 02/23/17 1458 Last data filed at 02/23/17 1300  Gross per 24 hour  Intake              363 ml  Output              700 ml  Net             -337 ml   Filed Weights   02/22/17 0902  Weight: (!) 144.9 kg (319 lb 7.1 oz)    Examination: General exam: Appears comfortable  HEENT: PERRLA, oral mucosa moist, no sclera icterus or thrush Respiratory  system: Clear to auscultation. Respiratory effort normal. Cardiovascular system: S1 & S2 heard, RRR.  No murmurs  Gastrointestinal system: Abdomen soft, non-tender, nondistended. Normal bowel sound. No organomegaly Central nervous system: Alert and oriented. No focal neurological deficits. Extremities: No cyanosis, clubbing or edema Skin: No rashes or ulcers Psychiatry:  Mood & affect appropriate.     Data Reviewed: I have personally reviewed following labs and imaging studies  CBC:  Recent Labs Lab 02/21/17 2208 02/22/17 0503  WBC 7.4 6.8  NEUTROABS 4.7 4.0  HGB 10.2* 9.5*  HCT 33.9* 31.7*  MCV 95.5 95.2  PLT 164 202   Basic Metabolic Panel:  Recent Labs Lab 02/21/17 2208 02/22/17 0503  NA 139 139  K 4.9 4.1  CL 102 105  CO2 26 27  GLUCOSE 106* 104*  BUN 28* 26*  CREATININE 1.87* 1.69*  CALCIUM 9.1 8.7*   GFR: Estimated Creatinine Clearance: 57.7 mL/min (A) (by C-G formula based on SCr of 1.69 mg/dL (H)). Liver Function Tests:  Recent Labs Lab 02/22/17 0503  AST 20  ALT 10*  ALKPHOS 66  BILITOT 1.1  PROT  6.1*  ALBUMIN 3.1*   No results for input(s): LIPASE, AMYLASE in the last 168 hours. No results for input(s): AMMONIA in the last 168 hours. Coagulation Profile: No results for input(s): INR, PROTIME in the last 168 hours. Cardiac Enzymes: No results for input(s): CKTOTAL, CKMB, CKMBINDEX, TROPONINI in the last 168 hours. BNP (last 3 results) No results for input(s): PROBNP in the last 8760 hours. HbA1C: No results for input(s): HGBA1C in the last 72 hours. CBG:  Recent Labs Lab 02/22/17 1213 02/22/17 1631 02/22/17 2015 02/23/17 0606 02/23/17 1144  GLUCAP 130* 116* 114* 102* 106*   Lipid Profile: No results for input(s): CHOL, HDL, LDLCALC, TRIG, CHOLHDL, LDLDIRECT in the last 72 hours. Thyroid Function Tests: No results for input(s): TSH, T4TOTAL, FREET4, T3FREE, THYROIDAB in the last 72 hours. Anemia Panel: No results for input(s):  VITAMINB12, FOLATE, FERRITIN, TIBC, IRON, RETICCTPCT in the last 72 hours. Urine analysis:    Component Value Date/Time   COLORURINE YELLOW 10/24/2016 1357   APPEARANCEUR CLEAR 10/24/2016 1357   LABSPEC 1.017 10/24/2016 1357   PHURINE 5.0 10/24/2016 1357   GLUCOSEU NEGATIVE 10/24/2016 1357   HGBUR NEGATIVE 10/24/2016 1357   BILIRUBINUR NEGATIVE 10/24/2016 1357   KETONESUR NEGATIVE 10/24/2016 1357   PROTEINUR NEGATIVE 10/24/2016 1357   UROBILINOGEN 0.2 04/23/2013 1619   NITRITE NEGATIVE 10/24/2016 1357   LEUKOCYTESUR TRACE (A) 10/24/2016 1357   Sepsis Labs: @LABRCNTIP (procalcitonin:4,lacticidven:4) )No results found for this or any previous visit (from the past 240 hour(s)).       Radiology Studies: Ct Head Wo Contrast  Result Date: 02/21/2017 CLINICAL DATA:  Fall with pain EXAM: CT HEAD WITHOUT CONTRAST CT CERVICAL SPINE WITHOUT CONTRAST TECHNIQUE: Multidetector CT imaging of the head and cervical spine was performed following the standard protocol without intravenous contrast. Multiplanar CT image reconstructions of the cervical spine were also generated. COMPARISON:  10/24/2016 FINDINGS: CT HEAD FINDINGS Brain: No acute territorial infarction, hemorrhage or intracranial mass is seen. There is motion degradation. Mild to moderate atrophy. Mild small vessel ischemic changes of the white matter. Nonenlarged ventricles. Vascular: No hyperdense vessels.  Carotid artery calcifications. Skull: No depressed skull fracture Sinuses/Orbits: Mucosal thickening in the ethmoid sinuses. No acute orbital abnormality. Other: None CT CERVICAL SPINE FINDINGS Alignment: Motion degradation limits the exam. Straightening of the cervical spine. Facet alignment grossly normal. Skull base and vertebrae: Craniovertebral junction is intact. No obvious fracture is seen. Soft tissues and spinal canal: Prominent prevertebral soft tissues. No visible canal hematoma Disc levels: Moderate severe multilevel degenerative  disc changes from C3 through C7. Diffuse canal stenosis of the mid to lower cervical spine. Multilevel facet degenerative changes with multiple levels of foraminal stenosis. Upper chest: Partially visualized right pleural effusion. No thyroid mass. Other: None IMPRESSION: 1. Motion degradation 2. No definite CT evidence for acute intracranial abnormality 3. Multilevel advanced degenerative changes of the spine. No obvious fracture. Electronically Signed   By: Donavan Foil M.D.   On: 02/21/2017 23:55   Ct Cervical Spine Wo Contrast  Result Date: 02/21/2017 CLINICAL DATA:  Fall with pain EXAM: CT HEAD WITHOUT CONTRAST CT CERVICAL SPINE WITHOUT CONTRAST TECHNIQUE: Multidetector CT imaging of the head and cervical spine was performed following the standard protocol without intravenous contrast. Multiplanar CT image reconstructions of the cervical spine were also generated. COMPARISON:  10/24/2016 FINDINGS: CT HEAD FINDINGS Brain: No acute territorial infarction, hemorrhage or intracranial mass is seen. There is motion degradation. Mild to moderate atrophy. Mild small vessel ischemic changes of the white  matter. Nonenlarged ventricles. Vascular: No hyperdense vessels.  Carotid artery calcifications. Skull: No depressed skull fracture Sinuses/Orbits: Mucosal thickening in the ethmoid sinuses. No acute orbital abnormality. Other: None CT CERVICAL SPINE FINDINGS Alignment: Motion degradation limits the exam. Straightening of the cervical spine. Facet alignment grossly normal. Skull base and vertebrae: Craniovertebral junction is intact. No obvious fracture is seen. Soft tissues and spinal canal: Prominent prevertebral soft tissues. No visible canal hematoma Disc levels: Moderate severe multilevel degenerative disc changes from C3 through C7. Diffuse canal stenosis of the mid to lower cervical spine. Multilevel facet degenerative changes with multiple levels of foraminal stenosis. Upper chest: Partially visualized right  pleural effusion. No thyroid mass. Other: None IMPRESSION: 1. Motion degradation 2. No definite CT evidence for acute intracranial abnormality 3. Multilevel advanced degenerative changes of the spine. No obvious fracture. Electronically Signed   By: Donavan Foil M.D.   On: 02/21/2017 23:55   Ct Thoracic Spine Wo Contrast  Result Date: 02/22/2017 CLINICAL DATA:  [place] backwards well timing issues. Severe back pain. EXAM: CT THORACIC AND LUMBAR SPINE WITHOUT CONTRAST TECHNIQUE: Multidetector CT imaging of the thoracic and lumbar spine was performed without contrast. Multiplanar CT image reconstructions were also generated. COMPARISON:  Chest radiograph October 24, 2016 and lumbar spine radiographs October 24, 2016 FINDINGS: CT THORACIC SPINE FINDINGS ALIGNMENT: Thoracic vertebral bodies are in alignment. Maintained thoracic lordosis. VERTEBRAE: Acute T7 2 column fracture with slight ventral widening. No retropulsed bony fragments. Intervertebral disc heights generally preserved, multilevel disc calcification. Mild osteopenia without destructive bony lesions. PARASPINAL AND OTHER SOFT TISSUES: Small to moderate RIGHT, small LEFT pleural effusions. Small prevertebral hematoma T5-6 -T7. Scattered ground-glass opacities RIGHT greater than LEFT lungs. Cardiomegaly and mild coronary artery calcifications incompletely assessed. DISC LEVELS: Multilevel mild RIGHT neural foraminal narrowing. No osseous canal stenosis. CT LUMBAR SPINE FINDINGS SEGMENTATION: For the purposes of this report the last well-formed intervertebral disc space is reported as L5-S1. ALIGNMENT: Maintained lumbar lordosis. Minimal L2-3 retrolisthesis, grade 1 L4-5 anterolisthesis. No spondylolysis. VERTEBRAE: Vertebral bodies and posterior elements are intact. Moderate L2-3 and L3-4 disc height loss with endplate spurring compatible with degenerative discs. Osteopenia without destructive bony lesions. PARASPINAL AND OTHER SOFT TISSUES: Included  prevertebral and paraspinal soft tissues are nonsuspicious. Moderate aortoiliac atherosclerosis, and incompletely assessed. DISC LEVELS: L1-2: No disc bulge, canal stenosis nor neural foraminal narrowing. L2-3: Retrolisthesis. Moderate broad-based disc osteophyte complex. No canal stenosis. Mild neural foraminal narrowing. L3-4: Moderate broad-based disc osteophyte complex. Moderate facet arthropathy and ligamentum flavum redundancy. No canal stenosis. Mild to moderate neural foraminal narrowing. L4-5: Anterolisthesis. Small broad-based disc osteophyte complex. Severe facet arthropathy and ligamentum flavum redundancy. Mild canal stenosis. Mild to moderate neural foraminal narrowing. L5-S1: Small broad-based disc bulge. Moderate to severe facet arthropathy without canal stenosis. Minimal neural foraminal narrowing. L3-4: IMPRESSION: CT THORACIC SPINE IMPRESSION 1. Acute T7 2 column fracture without height loss ; slight ventral widening consistent with hyperextension injury. No malalignment. 2. No osseous canal stenosis. Multilevel mild RIGHT neural foraminal narrowing . 3. Small to moderate RIGHT and small LEFT pleural effusions and findings of pulmonary edema. Recommend chest radiograph. CT LUMBAR SPINE IMPRESSION 1. No acute fracture. Minimal L2-3 retrolisthesis and grade 1 L4-5 anterolisthesis, no spondylolysis. 2. Mild canal stenosis L4-5. Neural foraminal narrowing L2-3 through L5-S1: Mild to moderate L3-4 and L4-5. Electronically Signed   By: Elon Alas M.D.   On: 02/22/2017 00:22   Ct Lumbar Spine Wo Contrast  Result Date: 02/22/2017 CLINICAL DATA:  [place] backwards  well timing issues. Severe back pain. EXAM: CT THORACIC AND LUMBAR SPINE WITHOUT CONTRAST TECHNIQUE: Multidetector CT imaging of the thoracic and lumbar spine was performed without contrast. Multiplanar CT image reconstructions were also generated. COMPARISON:  Chest radiograph October 24, 2016 and lumbar spine radiographs October 24, 2016  FINDINGS: CT THORACIC SPINE FINDINGS ALIGNMENT: Thoracic vertebral bodies are in alignment. Maintained thoracic lordosis. VERTEBRAE: Acute T7 2 column fracture with slight ventral widening. No retropulsed bony fragments. Intervertebral disc heights generally preserved, multilevel disc calcification. Mild osteopenia without destructive bony lesions. PARASPINAL AND OTHER SOFT TISSUES: Small to moderate RIGHT, small LEFT pleural effusions. Small prevertebral hematoma T5-6 -T7. Scattered ground-glass opacities RIGHT greater than LEFT lungs. Cardiomegaly and mild coronary artery calcifications incompletely assessed. DISC LEVELS: Multilevel mild RIGHT neural foraminal narrowing. No osseous canal stenosis. CT LUMBAR SPINE FINDINGS SEGMENTATION: For the purposes of this report the last well-formed intervertebral disc space is reported as L5-S1. ALIGNMENT: Maintained lumbar lordosis. Minimal L2-3 retrolisthesis, grade 1 L4-5 anterolisthesis. No spondylolysis. VERTEBRAE: Vertebral bodies and posterior elements are intact. Moderate L2-3 and L3-4 disc height loss with endplate spurring compatible with degenerative discs. Osteopenia without destructive bony lesions. PARASPINAL AND OTHER SOFT TISSUES: Included prevertebral and paraspinal soft tissues are nonsuspicious. Moderate aortoiliac atherosclerosis, and incompletely assessed. DISC LEVELS: L1-2: No disc bulge, canal stenosis nor neural foraminal narrowing. L2-3: Retrolisthesis. Moderate broad-based disc osteophyte complex. No canal stenosis. Mild neural foraminal narrowing. L3-4: Moderate broad-based disc osteophyte complex. Moderate facet arthropathy and ligamentum flavum redundancy. No canal stenosis. Mild to moderate neural foraminal narrowing. L4-5: Anterolisthesis. Small broad-based disc osteophyte complex. Severe facet arthropathy and ligamentum flavum redundancy. Mild canal stenosis. Mild to moderate neural foraminal narrowing. L5-S1: Small broad-based disc bulge.  Moderate to severe facet arthropathy without canal stenosis. Minimal neural foraminal narrowing. L3-4: IMPRESSION: CT THORACIC SPINE IMPRESSION 1. Acute T7 2 column fracture without height loss ; slight ventral widening consistent with hyperextension injury. No malalignment. 2. No osseous canal stenosis. Multilevel mild RIGHT neural foraminal narrowing . 3. Small to moderate RIGHT and small LEFT pleural effusions and findings of pulmonary edema. Recommend chest radiograph. CT LUMBAR SPINE IMPRESSION 1. No acute fracture. Minimal L2-3 retrolisthesis and grade 1 L4-5 anterolisthesis, no spondylolysis. 2. Mild canal stenosis L4-5. Neural foraminal narrowing L2-3 through L5-S1: Mild to moderate L3-4 and L4-5. Electronically Signed   By: Elon Alas M.D.   On: 02/22/2017 00:22      Scheduled Meds: . apixaban  5 mg Oral BID  . atorvastatin  40 mg Oral q morning - 10a  . cholecalciferol  1,000 Units Oral Daily  . darifenacin  7.5 mg Oral Daily  . furosemide  20 mg Oral Daily  . insulin aspart  0-5 Units Subcutaneous QHS  . insulin aspart  0-9 Units Subcutaneous TID WC  . lisinopril  20 mg Oral Daily  . metFORMIN  500 mg Oral BID WC  . metoprolol succinate  75 mg Oral Daily  . multivitamin with minerals  1 tablet Oral Daily  . potassium chloride  10 mEq Oral Daily  . sertraline  50 mg Oral Daily  . sodium chloride flush  3 mL Intravenous Q12H   Continuous Infusions:   LOS: 0 days    Time spent in minutes: 35     Debbe Odea, MD [facility] Pager: [email or web address] Password [facility] 02/23/2017, 2:58 PM

## 2017-02-23 NOTE — Evaluation (Signed)
Physical Therapy Evaluation Patient Details Name: Alex Bowman MRN: [id number] DOB: 1946/04/16 Today's Date: 02/23/2017   History of Present Illness  71 yo male s/p fall with T7 fracture, neurologically intact  Clinical Impression  Pt admitted with above diagnosis. Pt currently with functional limitations due to the deficits listed below (see PT Problem List). On eval, pt required +2 max assist for sit to stand from recliner and min assist +2 safety for ambulation 20 feet with RW. Pt fatigues quickly. 2 L O2 via [place] in place in recliner with SpO2 at 95%. O2 removed for mobility with desat to 89%. O2 replaced upon return to recliner. TLSO adjusted for better fit. RN educated on don/doff of brace.  Pt will benefit from skilled PT to increase their independence and safety with mobility to allow discharge to the venue listed below.       Follow Up Recommendations SNF;Supervision/Assistance - 24 hour    Equipment Recommendations  None recommended by PT    Recommendations for Other Services       Precautions / Restrictions Precautions Precautions: Fall Required Braces or Orthoses: Spinal Brace Spinal Brace: Thoracolumbosacral orthotic;Applied in sitting position Restrictions Weight Bearing Restrictions: No      Mobility  Bed Mobility               General bed mobility comments: in chair on arrival. reports cant sleep in bed and that he sleeps in lift chair at baseline  Transfers Overall transfer level: Needs assistance Equipment used: Rolling walker (2 wheeled) Transfers: Sit to/from Stand Sit to Stand: +2 physical assistance;Max assist         General transfer comment: requires rocking momentum, multi attempts. Built up recliner seat with blankets to assist with future standing attempts.  Ambulation/Gait Ambulation/Gait assistance: Min assist;+2 safety/equipment Ambulation Distance (Feet): 20 Feet Assistive device: Rolling walker (2 wheeled) Gait Pattern/deviations:  Step-through pattern;Decreased stride length;Trunk flexed Gait velocity: decreased Gait velocity interpretation: Below normal speed for age/gender General Gait Details: close chair follow for safety, verbal cues for RW management  Stairs            Wheelchair Mobility    Modified Rankin (Stroke Patients Only)       Balance Overall balance assessment: Needs assistance Sitting-balance support: Feet supported;No upper extremity supported Sitting balance-Leahy Scale: Good     Standing balance support: Bilateral upper extremity supported;During functional activity Standing balance-Leahy Scale: Poor Standing balance comment: heavy reliance on RW                             Pertinent Vitals/Pain Pain Assessment: 0-10 Pain Score: 4  Pain Location: back Pain Descriptors / Indicators: Constant Pain Intervention(s): Monitored during session;Premedicated before session;Repositioned    Home Living Family/patient expects to be discharged to:: Skilled nursing facility Living Arrangements: Spouse/significant other   Type of Home: House Home Access: Ramped entrance     Home Layout: One level Home Equipment: Cane - single point;Walker - 2 wheels;Shower seat Additional Comments: pt sleeps in lift chair    Prior Function Level of Independence: Needs assistance   Gait / Transfers Assistance Needed: walks in house with cane and furniture walks. requires RW in community. reports wife is more mobile that he is and she just got out of rehab  ADL's / Homemaking Assistance Needed: can not complete shower transfer because he reports its not safe        Hand Dominance   Dominant  Hand: Right    Extremity/Trunk Assessment   Upper Extremity Assessment Upper Extremity Assessment: Generalized weakness    Lower Extremity Assessment Lower Extremity Assessment: Generalized weakness    Cervical / Trunk Assessment Cervical / Trunk Assessment: Kyphotic  Communication    Communication: HOH  Cognition Arousal/Alertness: Awake/alert Behavior During Therapy: WFL for tasks assessed/performed Overall Cognitive Status: Within Functional Limits for tasks assessed                                        General Comments      Exercises     Assessment/Plan    PT Assessment Patient needs continued PT services  PT Problem List         PT Treatment Interventions DME instruction;Gait training;Functional mobility training;Therapeutic activities;Therapeutic exercise;Balance training;Patient/family education    PT Goals (Current goals can be found in the Care Plan section)  Acute Rehab PT Goals Patient Stated Goal: home PT Goal Formulation: With patient Time For Goal Achievement: 03/02/17 Potential to Achieve Goals: Good    Frequency Min 5X/week   Barriers to discharge        Co-evaluation PT/OT/SLP Co-Evaluation/Treatment: Yes Reason for Co-Treatment: For patient/therapist safety;To address functional/ADL transfers PT goals addressed during session: Mobility/safety with mobility;Balance         AM-PAC PT "6 Clicks" Daily Activity  Outcome Measure Difficulty turning over in bed (including adjusting bedclothes, sheets and blankets)?: Total Difficulty moving from lying on back to sitting on the side of the bed? : Total Difficulty sitting down on and standing up from a chair with arms (e.g., wheelchair, bedside commode, etc,.)?: Total Help needed moving to and from a bed to chair (including a wheelchair)?: A Lot Help needed walking in hospital room?: A Little Help needed climbing 3-5 steps with a railing? : Total 6 Click Score: 9    End of Session Equipment Utilized During Treatment: Gait belt Activity Tolerance: Patient tolerated treatment well Patient left: in chair;with call bell/phone within reach Nurse Communication: Mobility status PT Visit Diagnosis: Pain;Muscle weakness (generalized) (M62.81);Other abnormalities of gait  and mobility (R26.89)    Time: 5993-5701 PT Time Calculation (min) (ACUTE ONLY): 25 min   Charges:   PT Evaluation $PT Eval Moderate Complexity: 1 Procedure     PT G Codes:   PT G-Codes **NOT FOR INPATIENT CLASS** Functional Assessment Tool Used: AM-PAC 6 Clicks Basic Mobility Functional Limitation: Mobility: Walking and moving around Mobility: Walking and Moving Around Current Status ([id number]): At least 60 percent but less than 80 percent impaired, limited or restricted Mobility: Walking and Moving Around Goal Status [phone]): At least 40 percent but less than 60 percent impaired, limited or restricted    Lorrin Goodell, PT  Office # [phone] Pager [phone]   Lorriane Shire 02/23/2017, 10:45 AM

## 2017-02-23 NOTE — Evaluation (Signed)
Occupational Therapy Evaluation Patient Details Name: Alex Bowman MRN: [id number] DOB: 13-Mar-1946 Today's Date: 02/23/2017    History of Present Illness 71 yo male s/p fall with T7 fracture, neurologically intact  Past Medical History:  Diagnosis Date  . Arthritis   . Bilateral lower extremity edema    Chronic, with venous insufficiency  . Cataract    beginning  . Chronic kidney disease    kidney stones  . Complication of anesthesia    stayed awake during colonoscopy  . Diabetes mellitus without complication ([place])    "on medicine, but numbers have been good for years since weight loss"  . Dyslipidemia    Monitored by Primary Physician. On statin  . GERD (gastroesophageal reflux disease)   . Hypertension   . Knee pain    R>L  . Morbid obesity with BMI of 45.0-49.9, adult ([place])    Attempted weight loss efforts at last visit had BMI down to 42, unfortunately back up to 46 with weight going up to 312 lb from 285 lb  . Pneumonia 20 years ago   h/o      Clinical Impression   PT admitted with s/p fall with t7 fx. Pt currently with functional limitiations due to the deficits listed below (see OT problem list). PTA was mod I with RW and requires (A) at this time at max (A) for lB. Pt unable to complete basic transfer.  Pt will benefit from skilled OT to increase their independence and safety with adls and balance to allow discharge SNF.     Follow Up Recommendations  SNF;Supervision/Assistance - 24 hour    Equipment Recommendations  3 in 1 bedside commode;Other (comment) (bari walker)    Recommendations for Other Services       Precautions / Restrictions Precautions Precautions: Fall Restrictions Weight Bearing Restrictions: No      Mobility Bed Mobility               General bed mobility comments: in chair on arrival. reports cant sleep in bed and that he sleeps in lift chair at baseline  Transfers Overall transfer level: Needs assistance   Transfers:  Sit to/from Stand Sit to Stand: +2 physical assistance;Max assist         General transfer comment: requires rocking momentum    Balance                                           ADL either performed or assessed with clinical judgement   ADL Overall ADL's : Needs assistance/impaired Eating/Feeding: Independent   Grooming: Wash/dry hands   Upper Body Bathing: Set up   Lower Body Bathing: Maximal assistance           Toilet Transfer: +2 for physical assistance;Maximal assistance (for sit<>Stand)           Functional mobility during ADLs: Moderate assistance;Rolling walker General ADL Comments: pt requires extensive (A) for sit<>Stand pt unable to exit chair without (A) to elevate. surface elevated with blankets this sesion. pt uses lift chair at baseline     Vision Baseline Vision/History: Wears glasses Wears Glasses: At all times       Perception     Praxis      Pertinent Vitals/Pain Pain Assessment: 0-10 Pain Score: 4  Pain Location: back Pain Descriptors / Indicators: Constant Pain Intervention(s): Monitored during session;Premedicated before session;Repositioned  Hand Dominance Right   Extremity/Trunk Assessment Upper Extremity Assessment Upper Extremity Assessment: Generalized weakness   Lower Extremity Assessment Lower Extremity Assessment: Generalized weakness   Cervical / Trunk Assessment Cervical / Trunk Assessment: Kyphotic   Communication Communication Communication: HOH   Cognition Arousal/Alertness: Awake/alert Behavior During Therapy: WFL for tasks assessed/performed Overall Cognitive Status: Within Functional Limits for tasks assessed                                     General Comments       Exercises     Shoulder Instructions      Home Living Family/patient expects to be discharged to:: Skilled nursing facility Living Arrangements: Spouse/significant other   Type of Home:  House Home Access: Ramped entrance     Home Layout: One level     Bathroom Shower/Tub: Tub/shower unit         Home Equipment: Cane - single point (lift chair)   Additional Comments: pt sleeps in lift chair      Prior Functioning/Environment Level of Independence: Needs assistance  Gait / Transfers Assistance Needed: walks in house with cane and furniture walks. requires RW in community. reports wife is more mobile that he is and she just got out of rehab ADL's / Homemaking Assistance Needed: can not complete shower transfer because he reports its not safe Communication / Swallowing Assistance Needed: hoh          OT Problem List: Decreased strength;Decreased activity tolerance;Impaired balance (sitting and/or standing);Decreased safety awareness;Decreased knowledge of use of DME or AE;Decreased knowledge of precautions;Pain      OT Treatment/Interventions: Self-care/ADL training;Therapeutic exercise;Energy conservation;DME and/or AE instruction;Therapeutic activities;Patient/family education;Balance training    OT Goals(Current goals can be found in the care plan section) Acute Rehab OT Goals Patient Stated Goal: none stated OT Goal Formulation: With patient Time For Goal Achievement: 03/09/17 Potential to Achieve Goals: Good  OT Frequency: Min 2X/week   Barriers to D/C: Decreased caregiver support          Co-evaluation              AM-PAC PT "6 Clicks" Daily Activity     Outcome Measure Help from another person eating meals?: None Help from another person taking care of personal grooming?: A Little Help from another person toileting, which includes using toliet, bedpan, or urinal?: A Lot Help from another person bathing (including washing, rinsing, drying)?: A Lot Help from another person to put on and taking off regular upper body clothing?: A Little Help from another person to put on and taking off regular lower body clothing?: A Lot 6 Click Score: 16    End of Session Equipment Utilized During Treatment: Gait belt;Rolling walker;Back brace Nurse Communication: Mobility status;Precautions  Activity Tolerance: Patient tolerated treatment well Patient left: in chair;with call bell/phone within reach;with chair alarm set  OT Visit Diagnosis: Unsteadiness on feet (R26.81)                Time: 4259-5638 OT Time Calculation (min): 23 min Charges:  OT General Charges $OT Visit: 1 Procedure OT Evaluation $OT Eval High Complexity: 1 Procedure G-Codes: OT G-codes **NOT FOR INPATIENT CLASS** Functional Assessment Tool Used: Clinical judgement Functional Limitation: Self care Self Care Current Status (V5643): At least 80 percent but less than 100 percent impaired, limited or restricted Self Care Goal Status (P2951): At least 20 percent but less than 40 percent  impaired, limited or restricted    Jeri Modena   OTR/L Pager: [phone] Office: [phone] .   Parke Poisson B 02/23/2017, 10:17 AM

## 2017-02-23 NOTE — Clinical Social Work Note (Signed)
Clinical Social Work Assessment  Patient Details  Name: Alex Bowman MRN: [id number] Date of Birth: Apr 29, 1946  Date of referral:  02/23/17               Reason for consult:  Facility Placement                Permission sought to share information with:    Permission granted to share information::  Yes, Verbal Permission Granted  Name::        Agency::     Relationship::     Contact Information:     Housing/Transportation Living arrangements for the past 2 months:  Single Family Home Source of Information:  Patient Patient Interpreter Needed:  None Criminal Activity/Legal Involvement Pertinent to Current Situation/Hospitalization:    Significant Relationships:  None Lives with:  Spouse Do you feel safe going back to the place where you live?  Yes Need for family participation in patient care:  No (Coment)  Care giving concerns:  None listed by pt/family   Social Worker assessment / plan:  CSW met with pt and confirmed pt's plan to be discharged to SNF to live at discharge.  CSW provided active listening and validated pt's concern that pt prefers a SNF nearest his home in [facility]   CSW will complete FL-2 and send referrals out to SNF facilities in [place], [place] and [place] areas via the hub per pt's request.  Pt has been living independently prior to being admitted to [facility].   Employment status:  Retired Nurse, adult PT Recommendations:  [place] / Referral to community resources:     Patient/Family's Response to care:  Patient alert and oriented.  Patient agreeable to plan.  Pt's spouse supportive and strongly involved in pt.'s care.  Pt pleasant and appreciated CSW intervention.    Patient/Family's Understanding of and Emotional Response to Diagnosis, Current Treatment, and Prognosis:  Still assessing  Emotional Assessment Appearance:  Appears stated age Attitude/Demeanor/Rapport:    Affect  (typically observed):  Accepting, Adaptable, Calm, Pleasant Orientation:  Oriented to Self, Oriented to Place, Oriented to  Time, Oriented to Situation Alcohol / Substance use:    Psych involvement (Current and /or in the community):     Discharge Needs  Concerns to be addressed:  No discharge needs identified Readmission within the last 30 days:    Current discharge risk:  None Barriers to Discharge:  No Barriers Identified   Claudine Mouton, LCSWA 02/23/2017, 4:05 PM

## 2017-02-23 NOTE — NC FL2 (Signed)
[place] LEVEL OF CARE SCREENING TOOL     IDENTIFICATION  Patient Name: Alex Bowman Birthdate: 10-03-1945 Sex: male Admission Date (Current Location): 02/21/2017  [facility] and [place] Number:  Herbalist and Address:  [facility], [place] [address] [place], [place] [address]      Provider Number: [id number]  Attending Physician Name and Address:  Debbe Odea, MD  Relative Name and Phone Number:       Current Level of Care: Hospital Recommended Level of Care: [place] Prior Approval Number:    Date Approved/Denied: 02/23/17 PASRR Number: [id number]  Discharge Plan: SNF    Current Diagnoses: Patient Active Problem List   Diagnosis Date Noted  . Closed wedge compression fracture of T7 vertebra ([place]) 02/22/2017  . Anemia 02/22/2017  . Persistent atrial fibrillation ([place])   . Severe obesity (BMI >= 40) ([place]) 03/13/2013  . Dyslipidemia   . Hypertension   . Diabetes mellitus without complication ([place])   . Bilateral lower extremity edema   . OSA (obstructive sleep apnea)   . Metabolic syndrome 77/82/4235    Orientation RESPIRATION BLADDER Height & Weight     Self, Time, Situation, Place  Normal Continent Weight: (!) 319 lb 7.1 oz (144.9 kg) Height:  5\' 10"  (177.8 cm)  BEHAVIORAL SYMPTOMS/MOOD NEUROLOGICAL BOWEL NUTRITION STATUS      Continent Diet  AMBULATORY STATUS COMMUNICATION OF NEEDS Skin   Limited Assist Verbally Normal                       Personal Care Assistance Level of Assistance  Bathing, Dressing Bathing Assistance: Limited assistance   Dressing Assistance: Limited assistance     Functional Limitations Info             SPECIAL CARE FACTORS FREQUENCY  PT (By licensed PT), OT (By licensed OT)     PT Frequency: 5 OT Frequency: 5            Contractures Contractures Info: Not present    Additional Factors Info  Code Status, Allergies Code Status Info:  FULL Allergies Info: No Known Allergies           Current Medications (02/23/2017):  This is the current hospital active medication list Current Facility-Administered Medications  Medication Dose Route Frequency Provider Last Rate Last Dose  . apixaban (ELIQUIS) tablet 5 mg  5 mg Oral BID Jani Gravel, MD   5 mg at 02/23/17 1022  . atorvastatin (LIPITOR) tablet 40 mg  40 mg Oral q morning - 10a Jani Gravel, MD   40 mg at 02/23/17 1022  . cholecalciferol (VITAMIN D) tablet 1,000 Units  1,000 Units Oral Daily Jani Gravel, MD   1,000 Units at 02/23/17 1021  . darifenacin (ENABLEX) 24 hr tablet 7.5 mg  7.5 mg Oral Daily Jani Gravel, MD   7.5 mg at 02/23/17 1022  . furosemide (LASIX) tablet 20 mg  20 mg Oral Daily Jani Gravel, MD   20 mg at 02/23/17 1022  . insulin aspart (novoLOG) injection 0-5 Units  0-5 Units Subcutaneous QHS Jani Gravel, MD      . insulin aspart (novoLOG) injection 0-9 Units  0-9 Units Subcutaneous TID WC Jani Gravel, MD   1 Units at 02/22/17 1303  . lisinopril (PRINIVIL,ZESTRIL) tablet 20 mg  20 mg Oral Daily Jani Gravel, MD   20 mg at 02/23/17 1022  . metFORMIN (GLUCOPHAGE) tablet 500 mg  500 mg  Oral BID WC Jani Gravel, MD   500 mg at 02/23/17 0827  . metoprolol succinate (TOPROL-XL) 24 hr tablet 75 mg  75 mg Oral Daily Jani Gravel, MD   75 mg at 02/22/17 0858  . multivitamin with minerals tablet 1 tablet  1 tablet Oral Daily Jani Gravel, MD   1 tablet at 02/23/17 1022  . oxyCODONE (Oxy IR/ROXICODONE) immediate release tablet 10 mg  10 mg Oral Q4H PRN Debbe Odea, MD      . oxyCODONE (Oxy IR/ROXICODONE) immediate release tablet 5 mg  5 mg Oral Q4H PRN Rizwan, Saima, MD      . potassium chloride (K-DUR,KLOR-CON) CR tablet 10 mEq  10 mEq Oral Daily Jani Gravel, MD   10 mEq at 02/23/17 1022  . sertraline (ZOLOFT) tablet 50 mg  50 mg Oral Daily Jani Gravel, MD   50 mg at 02/23/17 1022  . sodium chloride flush (NS) 0.9 % injection 3 mL  3 mL Intravenous Q12H Jani Gravel, MD   3 mL at 02/23/17  1022  . zolpidem (AMBIEN) tablet 5 mg  5 mg Oral QHS PRN Jani Gravel, MD   5 mg at 02/22/17 0416     Discharge Medications: Please see discharge summary for a list of discharge medications.  Relevant Imaging Results:  Relevant Lab Results:   Additional Information [id number]  Alphonse Guild Amari Burnsworth, LCSWA

## 2017-02-23 NOTE — Progress Notes (Signed)
Pt seen and examined. No issues overnight. Currently resting comfortably. Pain fair  Denies paresthesias  EXAM: Temp:  [97.9 F (36.6 C)-98.7 F (37.1 C)] 97.9 F (36.6 C) (07/15 0307) Pulse Rate:  [46-50] 46 (07/15 0307) Resp:  [18-23] 18 (07/15 0307) BP: (123-149)/(49-60) 149/49 (07/15 0307) SpO2:  [85 %-100 %] 100 % (07/15 0307) Weight:  [144.9 kg (319 lb 7.1 oz)] 144.9 kg (319 lb 7.1 oz) (07/14 0902) Intake/Output      07/14 0701 - 07/15 0700   P.O. 120   Total Intake(mL/kg) 120 (0.8)   Urine (mL/kg/hr) 950 (0.3)   Total Output 950   Net -830        Awake and alert Follows commands throughout MAEW with good strength  Plan Appears stable.  Will continue to manage conservatively  TLSO brace when out of bed and upright PT/OT consult for possible SNF placement at request of family. I do believe this would be appropriate if he is willing based on his current functional status and family being unable to care for him at home.  Since he is stable from NS standpoint, will sign off. Call for any concerns F/U 4 weeks outpt with xrays

## 2017-02-24 DIAGNOSIS — N183 Chronic kidney disease, stage 3 unspecified: Secondary | ICD-10-CM

## 2017-02-24 DIAGNOSIS — E1122 Type 2 diabetes mellitus with diabetic chronic kidney disease: Secondary | ICD-10-CM

## 2017-02-24 DIAGNOSIS — S22060A Wedge compression fracture of T7-T8 vertebra, initial encounter for closed fracture: Secondary | ICD-10-CM | POA: Diagnosis not present

## 2017-02-24 DIAGNOSIS — W19XXXA Unspecified fall, initial encounter: Secondary | ICD-10-CM | POA: Diagnosis not present

## 2017-02-24 DIAGNOSIS — G4733 Obstructive sleep apnea (adult) (pediatric): Secondary | ICD-10-CM

## 2017-02-24 DIAGNOSIS — E785 Hyperlipidemia, unspecified: Secondary | ICD-10-CM

## 2017-02-24 DIAGNOSIS — E8881 Metabolic syndrome: Secondary | ICD-10-CM

## 2017-02-24 DIAGNOSIS — I480 Paroxysmal atrial fibrillation: Secondary | ICD-10-CM | POA: Diagnosis not present

## 2017-02-24 DIAGNOSIS — R600 Localized edema: Secondary | ICD-10-CM

## 2017-02-24 DIAGNOSIS — I481 Persistent atrial fibrillation: Secondary | ICD-10-CM

## 2017-02-24 LAB — URINALYSIS, ROUTINE W REFLEX MICROSCOPIC
BILIRUBIN URINE: NEGATIVE
GLUCOSE, UA: NEGATIVE mg/dL
KETONES UR: NEGATIVE mg/dL
NITRITE: NEGATIVE
PH: 5 (ref 5.0–8.0)
Protein, ur: NEGATIVE mg/dL
SPECIFIC GRAVITY, URINE: 1.011 (ref 1.005–1.030)
Squamous Epithelial / LPF: NONE SEEN

## 2017-02-24 LAB — GLUCOSE, CAPILLARY: Glucose-Capillary: 101 mg/dL — ABNORMAL HIGH (ref 65–99)

## 2017-02-24 MED ORDER — SENNA 8.6 MG PO TABS: 2.0000 | ORAL_TABLET | Freq: Every day | ORAL | 0 refills | Status: DC

## 2017-02-24 MED ORDER — POLYETHYLENE GLYCOL 3350 17 G PO PACK
17.0000 g | PACK | Freq: Every day | ORAL | Status: DC
  Administered 2017-02-24: 17 g via ORAL

## 2017-02-24 MED ORDER — POLYETHYLENE GLYCOL 3350 17 G PO PACK: 17.0000 g | PACK | Freq: Every day | ORAL | 0 refills | Status: DC

## 2017-02-24 MED ORDER — OXYCODONE HCL 10 MG PO TABS: 10.0000 mg | ORAL_TABLET | ORAL | 0 refills | Status: DC | PRN

## 2017-02-24 MED ORDER — OXYCODONE HCL 5 MG PO TABS: 5.0000 mg | ORAL_TABLET | ORAL | 0 refills | Status: DC | PRN

## 2017-02-24 NOTE — Progress Notes (Signed)
Report called to [place] at [facility] at 1145 by Jeneen Rinks. Pt transported via PTAR w VSS, in no apparent distress.

## 2017-02-24 NOTE — Progress Notes (Signed)
Physical Therapy Treatment Patient Details Name: Alex Bowman MRN: [id number] DOB: May 04, 1946 Today's Date: 02/24/2017    History of Present Illness 71 yo male s/p fall with T7 fracture, managed conservatively. PMHx: obesity, HTN, GERD, arthritis, DM    PT Comments    Upon arrival pt seated in chair bent over another chair in front of him. Pt and wife educated on back precautions and advised against being seated for long periods of time (pt stated he had been sitting in chair since 4:30am this morning). Pt ambulated further distance today with VSS. Pt will continue to benefit from acute therapy for mobilization, transfer training and increased activity tolerance.   Vitals post ambulation: Seated: BP 127/60 HR 79 bpm SpO2 92% on RA Supine: SpO2 95% on RA   Follow Up Recommendations  SNF;Supervision/Assistance - 24 hour     Equipment Recommendations  None recommended by PT    Recommendations for Other Services       Precautions / Restrictions Precautions Precautions: Fall;Back Precaution Booklet Issued: Yes (comment) Required Braces or Orthoses: Spinal Brace Spinal Brace: Thoracolumbosacral orthotic;Applied in sitting position Restrictions Weight Bearing Restrictions: No    Mobility  Bed Mobility Overal bed mobility: Needs Assistance Bed Mobility: Sit to Supine       Sit to supine: Mod assist   General bed mobility comments: Mod A for bringing LEs up onto bed. Max VCs for sequencing and log rolling. Pt had difficulty following commands for sequencing of rolling.   Transfers Overall transfer level: Needs assistance   Transfers: Sit to/from Stand Sit to Stand: Mod assist;+2 physical assistance         General transfer comment: Mod A +2 to rise and VCs for hand placement and posturing   Ambulation/Gait Ambulation/Gait assistance: Min guard Ambulation Distance (Feet): 75 Feet Assistive device: Rolling walker (2 wheeled) Gait Pattern/deviations: Step-through  pattern;Decreased stride length;Trunk flexed;Drifts right/left Gait velocity: decreased Gait velocity interpretation: Below normal speed for age/gender General Gait Details: Min guard for safety and max VC for posturing and use of RW (keeping inside RW during ambulation).    Stairs            Wheelchair Mobility    Modified Rankin (Stroke Patients Only)       Balance Overall balance assessment: Needs assistance Sitting-balance support: Feet supported;No upper extremity supported Sitting balance-Leahy Scale: Good     Standing balance support: Bilateral upper extremity supported;During functional activity Standing balance-Leahy Scale: Poor Standing balance comment: heavy reliance on RW                            Cognition Arousal/Alertness: Awake/alert Behavior During Therapy: WFL for tasks assessed/performed Overall Cognitive Status: Within Functional Limits for tasks assessed                                        Exercises      General Comments        Pertinent Vitals/Pain Pain Assessment: 0-10 Pain Score: 4  Pain Location: back Pain Descriptors / Indicators: Constant;Sore Pain Intervention(s): Limited activity within patient's tolerance;Repositioned;Monitored during session    Home Living                      Prior Function            PT Goals (current goals can now  be found in the care plan section) Acute Rehab PT Goals Patient Stated Goal: decrease pain Time For Goal Achievement: 03/10/17 Potential to Achieve Goals: Good Progress towards PT goals: Progressing toward goals    Frequency    Min 5X/week      PT Plan Current plan remains appropriate    Co-evaluation              AM-PAC PT "6 Clicks" Daily Activity  Outcome Measure  Difficulty turning over in bed (including adjusting bedclothes, sheets and blankets)?: Total Difficulty moving from lying on back to sitting on the side of the bed? :  Total Difficulty sitting down on and standing up from a chair with arms (e.g., wheelchair, bedside commode, etc,.)?: Total Help needed moving to and from a bed to chair (including a wheelchair)?: A Lot Help needed walking in hospital room?: A Little Help needed climbing 3-5 steps with a railing? : Total 6 Click Score: 9    End of Session Equipment Utilized During Treatment: Gait belt Activity Tolerance: Patient tolerated treatment well Patient left: in bed;with bed alarm set;with call bell/phone within reach Nurse Communication: Mobility status PT Visit Diagnosis: Pain;Muscle weakness (generalized) (M62.81);Other abnormalities of gait and mobility (R26.89);Difficulty in walking, not elsewhere classified (R26.2) Pain - part of body:  (back)     Time: 9233-0076 PT Time Calculation (min) (ACUTE ONLY): 28 min  Charges:  $Gait Training: 8-22 mins $Therapeutic Activity: 8-22 mins                    G Codes:       Elberta Leatherwood, SPT Acute Rehab [place] 02/24/2017, 9:58 AM

## 2017-02-24 NOTE — Care Management Obs Status (Signed)
[place] NOTIFICATION   Patient Details  Name: Alex Bowman MRN: [id number] Date of Birth: 06/21/1946   Medicare Observation Status Notification Given:  Yes Patient's wife signed on his behalf.     Ninfa Meeker, RN 02/24/2017, 10:30 AM

## 2017-02-24 NOTE — Discharge Summary (Addendum)
Physician Discharge Summary  Alex Bowman ERX:[id number] DOB: 1946-05-04 DOA: 02/21/2017  PCP: [name], MD  Admit date: 02/21/2017 Discharge date: 02/24/2017  Admitted From: home  Disposition:  SNF   Recommendations for Outpatient Follow-up:  1. Follow closely for oversedation- do not give narcotics if sleepy- check pulse ox BID and also when sleepy during the daytime  2. Prevent constipation 3. Check CBGs TID AC and HS 4. Back brace should be on when out of bed 5. Bmet to be done on this Thursday  Discharge Condition:  stable   CODE STATUS:  Full code   Consultations:  Neurosurgery    Discharge Diagnoses:  Principal Problem:   Closed wedge compression fracture of T7 vertebra (HCC) Active Problems:   Metabolic syndrome   Severe obesity (BMI >= 40) (HCC)   Dyslipidemia   Hypertension   Bilateral lower extremity edema   Anemia   AF (paroxysmal atrial fibrillation) (HCC)   DM type 2 causing CKD stage 3 (HCC)   CKD (chronic kidney disease) stage 3, GFR 30-59 ml/min    Subjective: He is up in a chair and has been ambulating to the bathroom. Pain is reasonably controlled.  No nausea, vomiting, dyspnea or cough. No other complaints.   Brief Summary: Alex Bowman a 71 y.o.male from homewho is overall in poor heath with Pafib, CKD3, HTN, DM2, morbid obesity, HTN, HLD, b/l lower extremity edema was walking in the living room with his walker and fell backwards, injuring his back.   In ED, CT head negative, CTs of the spine showed T7 compression fracture. Admitted for uncontrolled pain, neurosurgery consulted and brace ordered.     Hospital Course:  T7 compression fracture due to mechanical fall. - conservative management recommended by NS - cont pain control and ambulation with back brace - have started Oxycodone - has been ambulating to the bathroom with walker with assistance - prevent constipation and oversedation- follow closely at SNF  PAF -  currently sinus brady - cont Eliquis and Toprol  CKD 3 - follow Cr closely- on Lasix and Lisinopril   DM2 - cont Metformin    HTN - Lisinopril, Lasix, Toprol  Morbid obesity Body mass index is 45.84 kg/m.   Discharge Instructions  Discharge Instructions    Diet - low sodium heart healthy    Complete by:  As directed    Diet Carb Modified    Complete by:  As directed    Increase activity slowly    Complete by:  As directed      Allergies as of 02/24/2017   Not on File     Medication List    TAKE these medications   apixaban 5 MG Tabs tablet Commonly known as:  ELIQUIS Take 1 tablet (5 mg total) by mouth 2 (two) times daily.   atorvastatin 40 MG tablet Commonly known as:  LIPITOR Take 40 mg by mouth every morning.   cholecalciferol 1000 units tablet Commonly known as:  VITAMIN D Take 1,000 Units by mouth daily.   furosemide 20 MG tablet Commonly known as:  LASIX Take 20 mg by mouth daily.   lisinopril 20 MG tablet Commonly known as:  PRINIVIL,ZESTRIL TAKE 1 TABLET BY MOUTH TWICE DAILY   metFORMIN 500 MG tablet Commonly known as:  GLUCOPHAGE Take 500 mg by mouth 2 (two) times daily with a meal.   metoprolol succinate 50 MG 24 hr tablet Commonly known as:  TOPROL-XL Take 75mg  (1 and 1/2 tablets) twice a day by mouth  multivitamin with minerals Tabs tablet Take 1 tablet by mouth daily.   Oxycodone HCl 10 MG Tabs Take 1 tablet (10 mg total) by mouth every 4 (four) hours as needed for severe pain.   oxyCODONE 5 MG immediate release tablet Commonly known as:  Oxy IR/ROXICODONE Take 1 tablet (5 mg total) by mouth every 4 (four) hours as needed for moderate pain.   polyethylene glycol packet Commonly known as:  MIRALAX / GLYCOLAX Take 17 g by mouth daily. Hold for diarrhae   potassium chloride 10 MEQ CR capsule Commonly known as:  MICRO-K Take 10 mEq by mouth daily.   senna 8.6 MG Tabs tablet Commonly known as:  SENOKOT Take 2 tablets  (17.2 mg total) by mouth at bedtime.   sertraline 50 MG tablet Commonly known as:  ZOLOFT Take 50 mg by mouth daily.   solifenacin 5 MG tablet Commonly known as:  VESICARE Take 5 mg by mouth daily.   zolpidem 12.5 MG CR tablet Commonly known as:  AMBIEN CR Take 6.25 mg by mouth at bedtime as needed for sleep.      Follow-up Information    Consuella Lose, MD Follow up in 2 week(s).   Specialty:  Neurosurgery Contact information: [address] [place] [address] [phone]          Not on File   Procedures/Studies:   Ct Head Wo Contrast  Result Date: 02/21/2017 CLINICAL DATA:  Fall with pain EXAM: CT HEAD WITHOUT CONTRAST CT CERVICAL SPINE WITHOUT CONTRAST TECHNIQUE: Multidetector CT imaging of the head and cervical spine was performed following the standard protocol without intravenous contrast. Multiplanar CT image reconstructions of the cervical spine were also generated. COMPARISON:  10/24/2016 FINDINGS: CT HEAD FINDINGS Brain: No acute territorial infarction, hemorrhage or intracranial mass is seen. There is motion degradation. Mild to moderate atrophy. Mild small vessel ischemic changes of the white matter. Nonenlarged ventricles. Vascular: No hyperdense vessels.  Carotid artery calcifications. Skull: No depressed skull fracture Sinuses/Orbits: Mucosal thickening in the ethmoid sinuses. No acute orbital abnormality. Other: None CT CERVICAL SPINE FINDINGS Alignment: Motion degradation limits the exam. Straightening of the cervical spine. Facet alignment grossly normal. Skull base and vertebrae: Craniovertebral junction is intact. No obvious fracture is seen. Soft tissues and spinal canal: Prominent prevertebral soft tissues. No visible canal hematoma Disc levels: Moderate severe multilevel degenerative disc changes from C3 through C7. Diffuse canal stenosis of the mid to lower cervical spine. Multilevel facet degenerative changes with multiple levels  of foraminal stenosis. Upper chest: Partially visualized right pleural effusion. No thyroid mass. Other: None IMPRESSION: 1. Motion degradation 2. No definite CT evidence for acute intracranial abnormality 3. Multilevel advanced degenerative changes of the spine. No obvious fracture. Electronically Signed   By: Donavan Foil M.D.   On: 02/21/2017 23:55   Ct Cervical Spine Wo Contrast  Result Date: 02/21/2017 CLINICAL DATA:  Fall with pain EXAM: CT HEAD WITHOUT CONTRAST CT CERVICAL SPINE WITHOUT CONTRAST TECHNIQUE: Multidetector CT imaging of the head and cervical spine was performed following the standard protocol without intravenous contrast. Multiplanar CT image reconstructions of the cervical spine were also generated. COMPARISON:  10/24/2016 FINDINGS: CT HEAD FINDINGS Brain: No acute territorial infarction, hemorrhage or intracranial mass is seen. There is motion degradation. Mild to moderate atrophy. Mild small vessel ischemic changes of the white matter. Nonenlarged ventricles. Vascular: No hyperdense vessels.  Carotid artery calcifications. Skull: No depressed skull fracture Sinuses/Orbits: Mucosal thickening in the ethmoid sinuses. No acute  orbital abnormality. Other: None CT CERVICAL SPINE FINDINGS Alignment: Motion degradation limits the exam. Straightening of the cervical spine. Facet alignment grossly normal. Skull base and vertebrae: Craniovertebral junction is intact. No obvious fracture is seen. Soft tissues and spinal canal: Prominent prevertebral soft tissues. No visible canal hematoma Disc levels: Moderate severe multilevel degenerative disc changes from C3 through C7. Diffuse canal stenosis of the mid to lower cervical spine. Multilevel facet degenerative changes with multiple levels of foraminal stenosis. Upper chest: Partially visualized right pleural effusion. No thyroid mass. Other: None IMPRESSION: 1. Motion degradation 2. No definite CT evidence for acute intracranial abnormality 3.  Multilevel advanced degenerative changes of the spine. No obvious fracture. Electronically Signed   By: Donavan Foil M.D.   On: 02/21/2017 23:55   Ct Thoracic Spine Wo Contrast  Result Date: 02/22/2017 CLINICAL DATA:  [place] backwards well timing issues. Severe back pain. EXAM: CT THORACIC AND LUMBAR SPINE WITHOUT CONTRAST TECHNIQUE: Multidetector CT imaging of the thoracic and lumbar spine was performed without contrast. Multiplanar CT image reconstructions were also generated. COMPARISON:  Chest radiograph October 24, 2016 and lumbar spine radiographs October 24, 2016 FINDINGS: CT THORACIC SPINE FINDINGS ALIGNMENT: Thoracic vertebral bodies are in alignment. Maintained thoracic lordosis. VERTEBRAE: Acute T7 2 column fracture with slight ventral widening. No retropulsed bony fragments. Intervertebral disc heights generally preserved, multilevel disc calcification. Mild osteopenia without destructive bony lesions. PARASPINAL AND OTHER SOFT TISSUES: Small to moderate RIGHT, small LEFT pleural effusions. Small prevertebral hematoma T5-6 -T7. Scattered ground-glass opacities RIGHT greater than LEFT lungs. Cardiomegaly and mild coronary artery calcifications incompletely assessed. DISC LEVELS: Multilevel mild RIGHT neural foraminal narrowing. No osseous canal stenosis. CT LUMBAR SPINE FINDINGS SEGMENTATION: For the purposes of this report the last well-formed intervertebral disc space is reported as L5-S1. ALIGNMENT: Maintained lumbar lordosis. Minimal L2-3 retrolisthesis, grade 1 L4-5 anterolisthesis. No spondylolysis. VERTEBRAE: Vertebral bodies and posterior elements are intact. Moderate L2-3 and L3-4 disc height loss with endplate spurring compatible with degenerative discs. Osteopenia without destructive bony lesions. PARASPINAL AND OTHER SOFT TISSUES: Included prevertebral and paraspinal soft tissues are nonsuspicious. Moderate aortoiliac atherosclerosis, and incompletely assessed. DISC LEVELS: L1-2: No disc bulge,  canal stenosis nor neural foraminal narrowing. L2-3: Retrolisthesis. Moderate broad-based disc osteophyte complex. No canal stenosis. Mild neural foraminal narrowing. L3-4: Moderate broad-based disc osteophyte complex. Moderate facet arthropathy and ligamentum flavum redundancy. No canal stenosis. Mild to moderate neural foraminal narrowing. L4-5: Anterolisthesis. Small broad-based disc osteophyte complex. Severe facet arthropathy and ligamentum flavum redundancy. Mild canal stenosis. Mild to moderate neural foraminal narrowing. L5-S1: Small broad-based disc bulge. Moderate to severe facet arthropathy without canal stenosis. Minimal neural foraminal narrowing. L3-4: IMPRESSION: CT THORACIC SPINE IMPRESSION 1. Acute T7 2 column fracture without height loss ; slight ventral widening consistent with hyperextension injury. No malalignment. 2. No osseous canal stenosis. Multilevel mild RIGHT neural foraminal narrowing . 3. Small to moderate RIGHT and small LEFT pleural effusions and findings of pulmonary edema. Recommend chest radiograph. CT LUMBAR SPINE IMPRESSION 1. No acute fracture. Minimal L2-3 retrolisthesis and grade 1 L4-5 anterolisthesis, no spondylolysis. 2. Mild canal stenosis L4-5. Neural foraminal narrowing L2-3 through L5-S1: Mild to moderate L3-4 and L4-5. Electronically Signed   By: Elon Alas M.D.   On: 02/22/2017 00:22   Ct Lumbar Spine Wo Contrast  Result Date: 02/22/2017 CLINICAL DATA:  [place] backwards well timing issues. Severe back pain. EXAM: CT THORACIC AND LUMBAR SPINE WITHOUT CONTRAST TECHNIQUE: Multidetector CT imaging of the thoracic and lumbar spine was  performed without contrast. Multiplanar CT image reconstructions were also generated. COMPARISON:  Chest radiograph October 24, 2016 and lumbar spine radiographs October 24, 2016 FINDINGS: CT THORACIC SPINE FINDINGS ALIGNMENT: Thoracic vertebral bodies are in alignment. Maintained thoracic lordosis. VERTEBRAE: Acute T7 2 column fracture  with slight ventral widening. No retropulsed bony fragments. Intervertebral disc heights generally preserved, multilevel disc calcification. Mild osteopenia without destructive bony lesions. PARASPINAL AND OTHER SOFT TISSUES: Small to moderate RIGHT, small LEFT pleural effusions. Small prevertebral hematoma T5-6 -T7. Scattered ground-glass opacities RIGHT greater than LEFT lungs. Cardiomegaly and mild coronary artery calcifications incompletely assessed. DISC LEVELS: Multilevel mild RIGHT neural foraminal narrowing. No osseous canal stenosis. CT LUMBAR SPINE FINDINGS SEGMENTATION: For the purposes of this report the last well-formed intervertebral disc space is reported as L5-S1. ALIGNMENT: Maintained lumbar lordosis. Minimal L2-3 retrolisthesis, grade 1 L4-5 anterolisthesis. No spondylolysis. VERTEBRAE: Vertebral bodies and posterior elements are intact. Moderate L2-3 and L3-4 disc height loss with endplate spurring compatible with degenerative discs. Osteopenia without destructive bony lesions. PARASPINAL AND OTHER SOFT TISSUES: Included prevertebral and paraspinal soft tissues are nonsuspicious. Moderate aortoiliac atherosclerosis, and incompletely assessed. DISC LEVELS: L1-2: No disc bulge, canal stenosis nor neural foraminal narrowing. L2-3: Retrolisthesis. Moderate broad-based disc osteophyte complex. No canal stenosis. Mild neural foraminal narrowing. L3-4: Moderate broad-based disc osteophyte complex. Moderate facet arthropathy and ligamentum flavum redundancy. No canal stenosis. Mild to moderate neural foraminal narrowing. L4-5: Anterolisthesis. Small broad-based disc osteophyte complex. Severe facet arthropathy and ligamentum flavum redundancy. Mild canal stenosis. Mild to moderate neural foraminal narrowing. L5-S1: Small broad-based disc bulge. Moderate to severe facet arthropathy without canal stenosis. Minimal neural foraminal narrowing. L3-4: IMPRESSION: CT THORACIC SPINE IMPRESSION 1. Acute T7 2  column fracture without height loss ; slight ventral widening consistent with hyperextension injury. No malalignment. 2. No osseous canal stenosis. Multilevel mild RIGHT neural foraminal narrowing . 3. Small to moderate RIGHT and small LEFT pleural effusions and findings of pulmonary edema. Recommend chest radiograph. CT LUMBAR SPINE IMPRESSION 1. No acute fracture. Minimal L2-3 retrolisthesis and grade 1 L4-5 anterolisthesis, no spondylolysis. 2. Mild canal stenosis L4-5. Neural foraminal narrowing L2-3 through L5-S1: Mild to moderate L3-4 and L4-5. Electronically Signed   By: Elon Alas M.D.   On: 02/22/2017 00:22       Discharge Exam: Vitals:   02/23/17 2022 02/24/17 0554  BP: (!) 139/55 (!) 141/59  Pulse: (!) 58 (!) 59  Resp: 16 18  Temp: 98.4 F (36.9 C) (!) 97.5 F (36.4 C)   Vitals:   02/23/17 1020 02/23/17 1300 02/23/17 2022 02/24/17 0554  BP: (!) 145/52 (!) 143/65 (!) 139/55 (!) 141/59  Pulse: (!) 52 63 (!) 58 (!) 59  Resp: 18 18 16 18   Temp:  97.9 F (36.6 C) 98.4 F (36.9 C) (!) 97.5 F (36.4 C)  TempSrc:  Oral Oral Oral  SpO2: 100% 100% 97% 94%  Weight:      Height:        General: Pt is alert, awake, not in acute distress Cardiovascular: RRR, S1/S2 +, no rubs, no gallops Respiratory: CTA bilaterally, no wheezing, no rhonchi Abdominal: Soft, NT, ND, bowel sounds + Extremities: no edema, no cyanosis    The results of significant diagnostics from this hospitalization (including imaging, microbiology, ancillary and laboratory) are listed below for reference.     Microbiology: No results found for this or any previous visit (from the past 240 hour(s)).   Labs: BNP (last 3 results) No results for input(s): BNP in  the last 8760 hours. Basic Metabolic Panel:  Recent Labs Lab 02/21/17 2208 02/22/17 0503  NA 139 139  K 4.9 4.1  CL 102 105  CO2 26 27  GLUCOSE 106* 104*  BUN 28* 26*  CREATININE 1.87* 1.69*  CALCIUM 9.1 8.7*   Liver Function  Tests:  Recent Labs Lab 02/22/17 0503  AST 20  ALT 10*  ALKPHOS 66  BILITOT 1.1  PROT 6.1*  ALBUMIN 3.1*   No results for input(s): LIPASE, AMYLASE in the last 168 hours. No results for input(s): AMMONIA in the last 168 hours. CBC:  Recent Labs Lab 02/21/17 2208 02/22/17 0503  WBC 7.4 6.8  NEUTROABS 4.7 4.0  HGB 10.2* 9.5*  HCT 33.9* 31.7*  MCV 95.5 95.2  PLT 164 162   Cardiac Enzymes: No results for input(s): CKTOTAL, CKMB, CKMBINDEX, TROPONINI in the last 168 hours. BNP: Invalid input(s): POCBNP CBG:  Recent Labs Lab 02/23/17 0606 02/23/17 1144 02/23/17 1623 02/23/17 2021 02/24/17 0636  GLUCAP 102* 106* 123* 122* 101*   D-Dimer No results for input(s): DDIMER in the last 72 hours. Hgb A1c No results for input(s): HGBA1C in the last 72 hours. Lipid Profile No results for input(s): CHOL, HDL, LDLCALC, TRIG, CHOLHDL, LDLDIRECT in the last 72 hours. Thyroid function studies No results for input(s): TSH, T4TOTAL, T3FREE, THYROIDAB in the last 72 hours.  Invalid input(s): FREET3 Anemia work up No results for input(s): VITAMINB12, FOLATE, FERRITIN, TIBC, IRON, RETICCTPCT in the last 72 hours. Urinalysis    Component Value Date/Time   COLORURINE YELLOW 02/24/2017 0553   APPEARANCEUR CLEAR 02/24/2017 0553   LABSPEC 1.011 02/24/2017 0553   PHURINE 5.0 02/24/2017 0553   GLUCOSEU NEGATIVE 02/24/2017 0553   HGBUR LARGE (A) 02/24/2017 0553   BILIRUBINUR NEGATIVE 02/24/2017 0553   KETONESUR NEGATIVE 02/24/2017 0553   PROTEINUR NEGATIVE 02/24/2017 0553   UROBILINOGEN 0.2 04/23/2013 1619   NITRITE NEGATIVE 02/24/2017 0553   LEUKOCYTESUR SMALL (A) 02/24/2017 0553   Sepsis Labs Invalid input(s): PROCALCITONIN,  WBC,  LACTICIDVEN Microbiology No results found for this or any previous visit (from the past 240 hour(s)).   Time coordinating discharge: Over 30 minutes  SIGNED:   Debbe Odea, MD  [facility] 02/24/2017, 8:21 AM Pager   If 7PM-7AM,  please contact night-coverage [email or web address] Password [email or web address]

## 2017-02-24 NOTE — Clinical Social Work Placement (Signed)
   CLINICAL SOCIAL WORK PLACEMENT  NOTE  Date:  02/24/2017  Patient Details  Name: Alex Bowman MRN: [id number] Date of Birth: November 08, 1945  Clinical Social Work is seeking post-discharge placement for this patient at the [place] level of care (*CSW will initial, date and re-position this form in  chart as items are completed):      Patient/family provided with [place] Work Department's list of facilities offering this level of care within the geographic area requested by the patient (or if unable, by the patient's family).  Yes   Patient/family informed of their freedom to choose among providers that offer the needed level of care, that participate in Medicare, Medicaid or managed care program needed by the patient, have an available bed and are willing to accept the patient.      Patient/family informed of [place]'s ownership interest in [facility] and [facility], as well as of the fact that they are under no obligation to receive care at these facilities.  PASRR submitted to EDS on 02/23/17     PASRR number received on 02/23/17     Existing PASRR number confirmed on       FL2 transmitted to all facilities in geographic area requested by pt/family on       FL2 transmitted to all facilities within larger geographic area on 02/23/17     Patient informed that his/her managed care company has contracts with or will negotiate with certain facilities, including the following:        Yes   Patient/family informed of bed offers received.  Patient chooses bed [facility]     Physician recommends and patient chooses bed at      Patient to be transferred to [facility] on 02/24/17.  Patient to be transferred to facility by [facility]     Patient family notified on 02/24/17 of transfer.  Name of family member notified:  Joe     PHYSICIAN       Additional Comment:    _______________________________________________ Eileen Stanford, LCSW 02/24/2017, 10:42 AM

## 2017-02-24 NOTE — Clinical Social Work Note (Signed)
Clinical Social Worker facilitated patient discharge including contacting patient family and facility to confirm patient discharge plans.  Clinical information faxed to facility and family agreeable with plan.  CSW arranged ambulance transport via [facility] to [place] .  RN to call [phone] for report prior to discharge.  Clinical Social Worker will sign off for now as social work intervention is no longer needed. Please consult us again if new need arises.  Wilver Tignor, LCSWA [phone]   

## 2017-02-25 ENCOUNTER — Encounter (HOSPITAL_COMMUNITY): Payer: Self-pay | Admitting: Nurse Practitioner

## 2017-02-25 ENCOUNTER — Emergency Department (HOSPITAL_COMMUNITY)
Admission: EM | Admit: 2017-02-25 | Discharge: 2017-02-25 | Disposition: A | Discharge status: Home / Self Care | Payer: Medicare Other | Attending: Emergency Medicine | Admitting: Emergency Medicine

## 2017-02-25 DIAGNOSIS — R300 Dysuria: Secondary | ICD-10-CM | POA: Diagnosis not present

## 2017-02-25 DIAGNOSIS — Z7984 Long term (current) use of oral hypoglycemic drugs: Secondary | ICD-10-CM | POA: Insufficient documentation

## 2017-02-25 DIAGNOSIS — E1122 Type 2 diabetes mellitus with diabetic chronic kidney disease: Secondary | ICD-10-CM | POA: Insufficient documentation

## 2017-02-25 DIAGNOSIS — R310 Gross hematuria: Secondary | ICD-10-CM

## 2017-02-25 DIAGNOSIS — Z87891 Personal history of nicotine dependence: Secondary | ICD-10-CM | POA: Diagnosis not present

## 2017-02-25 DIAGNOSIS — I129 Hypertensive chronic kidney disease with stage 1 through stage 4 chronic kidney disease, or unspecified chronic kidney disease: Secondary | ICD-10-CM | POA: Insufficient documentation

## 2017-02-25 DIAGNOSIS — R319 Hematuria, unspecified: Secondary | ICD-10-CM | POA: Insufficient documentation

## 2017-02-25 DIAGNOSIS — I4891 Unspecified atrial fibrillation: Secondary | ICD-10-CM | POA: Insufficient documentation

## 2017-02-25 DIAGNOSIS — R350 Frequency of micturition: Secondary | ICD-10-CM | POA: Diagnosis not present

## 2017-02-25 DIAGNOSIS — Z7901 Long term (current) use of anticoagulants: Secondary | ICD-10-CM | POA: Diagnosis not present

## 2017-02-25 DIAGNOSIS — N183 Chronic kidney disease, stage 3 (moderate): Secondary | ICD-10-CM | POA: Diagnosis not present

## 2017-02-25 LAB — CBC WITH DIFFERENTIAL/PLATELET
Basophils Absolute: 0 10*3/uL (ref 0.0–0.1)
Basophils Relative: 0 %
Eosinophils Absolute: 0.4 10*3/uL (ref 0.0–0.7)
Eosinophils Relative: 5 %
HEMATOCRIT: 32.1 % — AB (ref 39.0–52.0)
HEMOGLOBIN: 10.3 g/dL — AB (ref 13.0–17.0)
LYMPHS ABS: 1.4 10*3/uL (ref 0.7–4.0)
LYMPHS PCT: 21 %
MCH: 29.2 pg (ref 26.0–34.0)
MCHC: 32.1 g/dL (ref 30.0–36.0)
MCV: 90.9 fL (ref 78.0–100.0)
Monocytes Absolute: 0.8 10*3/uL (ref 0.1–1.0)
Monocytes Relative: 12 %
NEUTROS ABS: 4.3 10*3/uL (ref 1.7–7.7)
NEUTROS PCT: 62 %
Platelets: 191 10*3/uL (ref 150–400)
RBC: 3.53 MIL/uL — AB (ref 4.22–5.81)
RDW: 14.7 % (ref 11.5–15.5)
WBC: 7 10*3/uL (ref 4.0–10.5)

## 2017-02-25 LAB — COMPREHENSIVE METABOLIC PANEL
ALT: 12 U/L — ABNORMAL LOW (ref 17–63)
ANION GAP: 9 (ref 5–15)
AST: 23 U/L (ref 15–41)
Albumin: 3.4 g/dL — ABNORMAL LOW (ref 3.5–5.0)
Alkaline Phosphatase: 60 U/L (ref 38–126)
BUN: 21 mg/dL — AB (ref 6–20)
CHLORIDE: 101 mmol/L (ref 101–111)
CO2: 29 mmol/L (ref 22–32)
Calcium: 8.9 mg/dL (ref 8.9–10.3)
Creatinine, Ser: 1.34 mg/dL — ABNORMAL HIGH (ref 0.61–1.24)
GFR, EST AFRICAN AMERICAN: 60 mL/min — AB (ref >60)
GFR, EST NON AFRICAN AMERICAN: 52 mL/min — AB (ref >60)
Glucose, Bld: 110 mg/dL — ABNORMAL HIGH (ref 65–99)
POTASSIUM: 4.1 mmol/L (ref 3.5–5.1)
Sodium: 139 mmol/L (ref 135–145)
Total Bilirubin: 0.9 mg/dL (ref 0.3–1.2)
Total Protein: 6.5 g/dL (ref 6.5–8.1)

## 2017-02-25 LAB — URINALYSIS, ROUTINE W REFLEX MICROSCOPIC
Bilirubin Urine: NEGATIVE
Glucose, UA: NEGATIVE mg/dL
Ketones, ur: NEGATIVE mg/dL
LEUKOCYTES UA: NEGATIVE
Nitrite: NEGATIVE
Protein, ur: NEGATIVE mg/dL
SPECIFIC GRAVITY, URINE: 1.005 (ref 1.005–1.030)
SQUAMOUS EPITHELIAL / LPF: NONE SEEN
pH: 7 (ref 5.0–8.0)

## 2017-02-25 LAB — PROTIME-INR
INR: 1.88
PROTHROMBIN TIME: 21.9 s — AB (ref 11.4–15.2)

## 2017-02-25 MED ORDER — CEPHALEXIN 500 MG PO CAPS: 500.0000 mg | ORAL_CAPSULE | Freq: Three times a day (TID) | ORAL | 0 refills | Status: DC

## 2017-02-25 NOTE — ED Notes (Signed)
[place] to give report and was transferred several times. Did not get a nurse.

## 2017-02-25 NOTE — ED Notes (Signed)
Bed: EX93 Expected date:  Expected time:  Means of arrival:  Comments: 71 year old Male.

## 2017-02-25 NOTE — ED Provider Notes (Signed)
[facility] DEPT Provider Note   CSN: [id number] Arrival date & time: 02/25/17  1410     History   Chief Complaint Chief Complaint  Patient presents with  . Hematuria    HPI Alex Bowman is a 71 y.o. male.  Alex Bowman is a 71 y.o. Male with a history of PAF on Eliquis, kidney stones, who presents to the ED complaining of gross hematuria ongoing for the past 2 weeks. Patient also reports some associated urgency and stinging when urinating at times. He reports he first noticed his hematuria about 2 weeks ago and it can be intermittent. He reports today it's been more red. He denies any difficulty urinating. He denies any abdominal pain or new back pain. He was recently admitted to the hospital around 5 days ago for a T7 compression fracture after a fall. He did notice the hematuria prior to this fall. He is on Eliquis. He has a history of kidney stones, but denies any pain consistent with any kidney stones. He denies fevers, difficulty urinating, testicular pain, penile pain, abdominal pain, nausea, vomiting, diarrhea, chest pain, shortness of breath or coughing.    The history is provided by the patient, medical records and the spouse. No language interpreter was used.  Hematuria  Pertinent negatives include no chest pain, no abdominal pain, no headaches and no shortness of breath.    Past Medical History:  Diagnosis Date  . Arthritis   . Bilateral lower extremity edema    Chronic, with venous insufficiency  . Cataract    beginning  . Chronic kidney disease    kidney stones  . Complication of anesthesia    stayed awake during colonoscopy  . Diabetes mellitus without complication ([place])    "on medicine, but numbers have been good for years since weight loss"  . Dyslipidemia    Monitored by Primary Physician. On statin  . GERD (gastroesophageal reflux disease)   . Hypertension   . Knee pain    R>L  . Morbid obesity with BMI of 45.0-49.9, adult ([place])    Attempted  weight loss efforts at last visit had BMI down to 42, unfortunately back up to 46 with weight going up to 312 lb from 285 lb  . Pneumonia 20 years ago   h/o    Patient Active Problem List   Diagnosis Date Noted  . AF (paroxysmal atrial fibrillation) ([place]) 02/24/2017  . DM type 2 causing CKD stage 3 ([place]) 02/24/2017  . CKD (chronic kidney disease) stage 3, GFR 30-59 ml/min 02/24/2017  . Closed wedge compression fracture of T7 vertebra ([place]) 02/22/2017  . Anemia 02/22/2017  . Severe obesity (BMI >= 40) ([place]) 03/13/2013  . Dyslipidemia   . Hypertension   . Bilateral lower extremity edema   . Metabolic syndrome 61/95/0932    Past Surgical History:  Procedure Laterality Date  . 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 Mild elevation in LVDP, EF 55%. --> False-positive stress test  . CARDIOVERSION N/A 11/27/2016   Procedure: CARDIOVERSION;  Surgeon: Skeet Latch, MD;  Location: [facility];  Service: Cardiovascular;  Laterality: N/A;  . COLONOSCOPY    . CYSTOSCOPY WITH STENT PLACEMENT Left 04/19/2013   Procedure: CYSTOSCOPY WITH STENT PLACEMENT;  Surgeon: Alexis Frock, MD;  Location: [facility];  Service: Urology;  Laterality: Left;  RIGHT RETROGRADE PYELOGRAM  . CYSTOSCOPY WITH URETEROSCOPY Left 04/21/2013   Procedure: CYSTOSCOPY WITH URETEROSCOPY bilateral retrograde, bilateral stent change, stone extraction;  Surgeon: Alexis Frock, MD;  Location: [facility];  Service: Urology;  Laterality: Left;  bilateral ureters  . HAND SURGERY Bilateral 15 years ago  . HOLMIUM LASER APPLICATION Right 5/62/1308   Procedure: HOLMIUM LASER APPLICATION;  Surgeon: Alexis Frock, MD;  Location: [facility];  Service: Urology;  Laterality: Right;  . NEPHROLITHOTOMY Right 04/19/2013   Procedure: NEPHROLITHOTOMY PERCUTANEOUS;  Surgeon: Alexis Frock, MD;  Location: [facility];  Service: Urology;  Laterality: Right;  . NEPHROLITHOTOMY Right 04/21/2013   Procedure:  NEPHROLITHOTOMY PERCUTANEOUS RIGHT 2ND STAGE PERCUTANEOUS NEPHROLITHOTOMY;  Surgeon: Alexis Frock, MD;  Location: [facility];  Service: Urology;  Laterality: Right;  . NM MYOVIEW LTD; [place]  May 2012   Suggested as high risk with 3 times a day 1.2 to, mild to moderate 3 times a day affecting basolateral and mid lateral walls.  [name] POLYPECTOMY         Home Medications    Prior to Admission medications   Medication Sig Start Date End Date Taking? Authorizing Provider  apixaban (ELIQUIS) 5 MG TABS tablet Take 1 tablet (5 mg total) by mouth 2 (two) times daily. 10/30/16  Yes Sherran Needs, NP  atorvastatin (LIPITOR) 40 MG tablet Take 40 mg by mouth every morning.   Yes [provider]  cholecalciferol (VITAMIN D) 1000 UNITS tablet Take 1,000 Units by mouth daily.   Yes [provider]  furosemide (LASIX) 20 MG tablet Take 20 mg by mouth daily.   Yes [provider]  lisinopril (PRINIVIL,ZESTRIL) 20 MG tablet TAKE 1 TABLET BY MOUTH TWICE DAILY 07/11/14  Yes [provider]  metFORMIN (GLUCOPHAGE) 500 MG tablet Take 500 mg by mouth 2 (two) times daily with a meal.   Yes [provider]  metoprolol succinate (TOPROL-XL) 50 MG 24 hr tablet Take 75mg  (1 and 1/2 tablets) twice a day by mouth 12/04/16  Yes Sherran Needs, NP  Multiple Vitamin (MULTIVITAMIN WITH MINERALS) TABS tablet Take 1 tablet by mouth daily.   Yes [provider]  oxyCODONE (OXY IR/ROXICODONE) 5 MG immediate release tablet Take 1 tablet (5 mg total) by mouth every 4 (four) hours as needed for moderate pain. 02/24/17  Yes Debbe Odea, MD  oxyCODONE 10 MG TABS Take 1 tablet (10 mg total) by mouth every 4 (four) hours as needed for severe pain. 02/24/17  Yes Debbe Odea, MD  polyethylene glycol (MIRALAX / GLYCOLAX) packet Take 17 g by mouth daily. Hold for diarrhae 02/24/17  Yes Rizwan, Eunice Blase, MD  potassium chloride (MICRO-K) 10 MEQ CR capsule Take 10 mEq by mouth daily.   Yes  [provider]  senna (SENOKOT) 8.6 MG TABS tablet Take 2 tablets (17.2 mg total) by mouth at bedtime. 02/24/17  Yes Debbe Odea, MD  sertraline (ZOLOFT) 50 MG tablet Take 50 mg by mouth daily.   Yes [provider]  solifenacin (VESICARE) 5 MG tablet Take 5 mg by mouth daily.   Yes [provider]  zolpidem (AMBIEN CR) 6.25 MG CR tablet Take 6.25 mg by mouth at bedtime as needed for sleep.   Yes [provider]  cephALEXin (KEFLEX) 500 MG capsule Take 1 capsule (500 mg total) by mouth 3 (three) times daily. 02/25/17   Waynetta Pean, PA-C    Family History Family History  Problem Relation Age of Onset  . Breast cancer Mother   . Prostate cancer Father   . Colon cancer Neg Hx     Social History Social History  Substance Use  Topics  . Smoking status: Former Smoker    Packs/day: 1.00    Years: 30.00    Types: Cigarettes    Quit date: 03/02/1997  . Smokeless tobacco: Former Systems developer    Types: Chew    Quit date: 08/12/2002  . Alcohol use No     Allergies   Patient has no known allergies.   Review of Systems Review of Systems  Constitutional: Negative for chills and fever.  HENT: Negative for congestion and sore throat.   Eyes: Negative for visual disturbance.  Respiratory: Negative for cough, shortness of breath and wheezing.   Cardiovascular: Negative for chest pain.  Gastrointestinal: Negative for abdominal pain, diarrhea, nausea and vomiting.  Genitourinary: Positive for dysuria, frequency and hematuria. Negative for decreased urine volume, difficulty urinating, penile pain and testicular pain.  Musculoskeletal: Positive for back pain (ongoing. ). Negative for neck pain.  Skin: Negative for rash.  Neurological: Negative for light-headedness and headaches.     Physical Exam Updated Vital Signs BP (!) 150/77 (BP Location: Left Arm)   Pulse 63   Temp 98.1 F (36.7 C) (Oral)   Resp 18   Ht 5\' 10"  (1.778 m)   Wt (!) 144.7 kg (319 lb)    SpO2 96%   BMI 45.77 kg/m   Physical Exam  Constitutional: He appears well-developed and well-nourished. No distress.  Nontoxic appearing. Obese male.  HENT:  Head: Normocephalic and atraumatic.  Mouth/Throat: Oropharynx is clear and moist.  Eyes: Pupils are equal, round, and reactive to light. Conjunctivae are normal. Right eye exhibits no discharge. Left eye exhibits no discharge.  Neck: Neck supple.  Cardiovascular: Normal rate, regular rhythm, normal heart sounds and intact distal pulses.   Pulmonary/Chest: Effort normal and breath sounds normal. No respiratory distress. He has no wheezes. He has no rales.  Abdominal: Soft. He exhibits no mass. There is no tenderness. There is no guarding.  Abdomen is soft and nontender to palpation.  Genitourinary: No penile tenderness.  Genitourinary Comments: GU exam with male PA student as chaperone. Penis is uncircumcised. No evidence of phimosis or paraphimosis. No penile or testicular tenderness to palpation. No GU rashes noted.  Musculoskeletal: He exhibits no edema.  Lymphadenopathy:    He has no cervical adenopathy.  Neurological: He is alert. Coordination normal.  Skin: Skin is warm and dry. No rash noted. He is not diaphoretic. No erythema. No pallor.  Psychiatric: He has a normal mood and affect. His behavior is normal.  Nursing note and vitals reviewed.    ED Treatments / Results  Labs (all labs ordered are listed, but only abnormal results are displayed) Labs Reviewed  URINALYSIS, ROUTINE W REFLEX MICROSCOPIC - Abnormal; Notable for the following:       Result Value   Color, Urine STRAW (*)    Hgb urine dipstick LARGE (*)    Bacteria, UA RARE (*)    All other components within normal limits  PROTIME-INR - Abnormal; Notable for the following:    Prothrombin Time 21.9 (*)    All other components within normal limits  COMPREHENSIVE METABOLIC PANEL - Abnormal; Notable for the following:    Glucose, Bld 110 (*)    BUN 21  (*)    Creatinine, Ser 1.34 (*)    Albumin 3.4 (*)    ALT 12 (*)    GFR calc non Af Amer 52 (*)    GFR calc Af Amer 60 (*)    All other components within normal limits  CBC  WITH DIFFERENTIAL/PLATELET - Abnormal; Notable for the following:    RBC 3.53 (*)    Hemoglobin 10.3 (*)    HCT 32.1 (*)    All other components within normal limits  URINE CULTURE    EKG  EKG Interpretation None       Radiology No results found.  Procedures Procedures (including critical care time)  Medications Ordered in ED Medications - No data to display   Initial Impression / Assessment and Plan / ED Course  I have reviewed the triage vital signs and the nursing notes.  Pertinent labs & imaging results that were available during my care of the patient were reviewed by me and considered in my medical decision making (see chart for details).    This is a 71 y.o. Male with a history of PAF on Eliquis, kidney stones, who presents to the ED complaining of gross hematuria ongoing for the past 2 weeks. Patient also reports some associated urgency and stinging when urinating at times. He reports he first noticed his hematuria about 2 weeks ago and it can be intermittent. He reports today it's been more red. He denies any difficulty urinating. He denies any abdominal pain or new back pain. He was recently admitted to the hospital around 5 days ago for a T7 compression fracture after a fall. He did notice the hematuria prior to this fall. He is on Eliquis. He has a history of kidney stones, but denies any pain consistent with any kidney stones. He denies fevers, difficulty urinating, testicular pain, penile pain.    On exam the patient is afebrile nontoxic appearing. His abdomen is soft and nontender to palpation. GU exam shows uncircumcised penis. No penile testicular tenderness to palpation. No suprapubic tenderness to palpation. Urinalysis shows large hemoglobin, is nitrite and leukocyte negative with 6-30  white blood cells and rare bacteria. CBC shows no leukocytosis. Hemoglobin is improved from his baseline. [place] shows a creatinine that is improved from his baseline.  After discussion with the patient and my attending, we will plan to treat for possible hemorrhagic cystitis with Keflex. Will have him follow closely with his urologist Dr. Tresa Moore. We also reviewed the patient's CT imaging from a few days ago. He had CT of his thoracic and lumbar spine. No concerning findings with relation to his aorta or kidneys on these images that we can visualize. Patient and family agree with plan. Will follow up with urology. I advised the patient to follow-up with their primary care provider this week. I advised the patient to return to the emergency department with new or worsening symptoms or new concerns. The patient verbalized understanding and agreement with plan.   This patient was discussed with and evaluated by Dr. Ellender Hose who agrees with assessment and plan.   Final Clinical Impressions(s) / ED Diagnoses   Final diagnoses:  Gross hematuria  Anticoagulated    New Prescriptions New Prescriptions   CEPHALEXIN (KEFLEX) 500 MG CAPSULE    Take 1 capsule (500 mg total) by mouth 3 (three) times daily.     Waynetta Pean, PA-C 02/25/17 1744    Duffy Bruce, MD 02/26/17 1146

## 2017-02-25 NOTE — ED Notes (Signed)
PTAR Picking up patient.

## 2017-02-25 NOTE — ED Triage Notes (Signed)
Pt present via ems from [facility] with c/o blood in his urine per the staff. He was discharged yesterday due to s/p fall. Per nurse at facility patient complained of burning with urination and spotting of blood with urination. Denies pain anywhere else. A&Ox4. Neuro intact. Facility wanted him evaluated for possible UA. VS: 110/68, 84, 18,97 %RA  NSR.

## 2017-02-27 LAB — URINE CULTURE

## 2017-03-04 ENCOUNTER — Encounter (HOSPITAL_COMMUNITY): Payer: Self-pay | Admitting: Emergency Medicine

## 2017-03-04 ENCOUNTER — Inpatient Hospital Stay (HOSPITAL_COMMUNITY)
Admission: EM | Admit: 2017-03-04 | Discharge: 2017-03-07 | DRG: 314 | Disposition: A | Discharge status: Skilled Nursing Facility | Payer: Medicare Other | Attending: Internal Medicine | Admitting: Internal Medicine

## 2017-03-04 ENCOUNTER — Emergency Department (HOSPITAL_COMMUNITY): Payer: Medicare Other

## 2017-03-04 DIAGNOSIS — Z87440 Personal history of urinary (tract) infections: Secondary | ICD-10-CM

## 2017-03-04 DIAGNOSIS — F419 Anxiety disorder, unspecified: Secondary | ICD-10-CM | POA: Diagnosis present

## 2017-03-04 DIAGNOSIS — N183 Chronic kidney disease, stage 3 unspecified: Secondary | ICD-10-CM

## 2017-03-04 DIAGNOSIS — R4182 Altered mental status, unspecified: Secondary | ICD-10-CM

## 2017-03-04 DIAGNOSIS — K5900 Constipation, unspecified: Secondary | ICD-10-CM | POA: Diagnosis present

## 2017-03-04 DIAGNOSIS — S22060G Wedge compression fracture of T7-T8 vertebra, subsequent encounter for fracture with delayed healing: Secondary | ICD-10-CM | POA: Diagnosis not present

## 2017-03-04 DIAGNOSIS — I959 Hypotension, unspecified: Principal | ICD-10-CM | POA: Diagnosis present

## 2017-03-04 DIAGNOSIS — Z7901 Long term (current) use of anticoagulants: Secondary | ICD-10-CM

## 2017-03-04 DIAGNOSIS — E785 Hyperlipidemia, unspecified: Secondary | ICD-10-CM | POA: Diagnosis present

## 2017-03-04 DIAGNOSIS — K219 Gastro-esophageal reflux disease without esophagitis: Secondary | ICD-10-CM | POA: Diagnosis present

## 2017-03-04 DIAGNOSIS — G9341 Metabolic encephalopathy: Secondary | ICD-10-CM

## 2017-03-04 DIAGNOSIS — N179 Acute kidney failure, unspecified: Secondary | ICD-10-CM | POA: Diagnosis present

## 2017-03-04 DIAGNOSIS — Y92199 Unspecified place in other specified residential institution as the place of occurrence of the external cause: Secondary | ICD-10-CM

## 2017-03-04 DIAGNOSIS — W06XXXA Fall from bed, initial encounter: Secondary | ICD-10-CM | POA: Diagnosis present

## 2017-03-04 DIAGNOSIS — F17220 Nicotine dependence, chewing tobacco, uncomplicated: Secondary | ICD-10-CM | POA: Diagnosis present

## 2017-03-04 DIAGNOSIS — Z803 Family history of malignant neoplasm of breast: Secondary | ICD-10-CM

## 2017-03-04 DIAGNOSIS — J9811 Atelectasis: Secondary | ICD-10-CM | POA: Diagnosis present

## 2017-03-04 DIAGNOSIS — S22060D Wedge compression fracture of T7-T8 vertebra, subsequent encounter for fracture with routine healing: Secondary | ICD-10-CM

## 2017-03-04 DIAGNOSIS — W19XXXA Unspecified fall, initial encounter: Secondary | ICD-10-CM | POA: Diagnosis present

## 2017-03-04 DIAGNOSIS — A419 Sepsis, unspecified organism: Secondary | ICD-10-CM | POA: Diagnosis present

## 2017-03-04 DIAGNOSIS — Z7984 Long term (current) use of oral hypoglycemic drugs: Secondary | ICD-10-CM | POA: Diagnosis not present

## 2017-03-04 DIAGNOSIS — D649 Anemia, unspecified: Secondary | ICD-10-CM | POA: Diagnosis present

## 2017-03-04 DIAGNOSIS — J90 Pleural effusion, not elsewhere classified: Secondary | ICD-10-CM | POA: Diagnosis present

## 2017-03-04 DIAGNOSIS — I10 Essential (primary) hypertension: Secondary | ICD-10-CM | POA: Diagnosis present

## 2017-03-04 DIAGNOSIS — E1122 Type 2 diabetes mellitus with diabetic chronic kidney disease: Secondary | ICD-10-CM | POA: Diagnosis present

## 2017-03-04 DIAGNOSIS — Z6841 Body Mass Index (BMI) 40.0 and over, adult: Secondary | ICD-10-CM | POA: Diagnosis not present

## 2017-03-04 DIAGNOSIS — S22060A Wedge compression fracture of T7-T8 vertebra, initial encounter for closed fracture: Secondary | ICD-10-CM | POA: Diagnosis present

## 2017-03-04 DIAGNOSIS — I480 Paroxysmal atrial fibrillation: Secondary | ICD-10-CM | POA: Diagnosis present

## 2017-03-04 DIAGNOSIS — R319 Hematuria, unspecified: Secondary | ICD-10-CM | POA: Diagnosis present

## 2017-03-04 DIAGNOSIS — R001 Bradycardia, unspecified: Secondary | ICD-10-CM | POA: Diagnosis present

## 2017-03-04 DIAGNOSIS — F329 Major depressive disorder, single episode, unspecified: Secondary | ICD-10-CM | POA: Diagnosis present

## 2017-03-04 DIAGNOSIS — E669 Obesity, unspecified: Secondary | ICD-10-CM | POA: Diagnosis present

## 2017-03-04 DIAGNOSIS — G92 Toxic encephalopathy: Secondary | ICD-10-CM | POA: Diagnosis present

## 2017-03-04 DIAGNOSIS — F32A Depression, unspecified: Secondary | ICD-10-CM | POA: Diagnosis present

## 2017-03-04 DIAGNOSIS — E860 Dehydration: Secondary | ICD-10-CM | POA: Diagnosis present

## 2017-03-04 DIAGNOSIS — Z87442 Personal history of urinary calculi: Secondary | ICD-10-CM

## 2017-03-04 DIAGNOSIS — Z8042 Family history of malignant neoplasm of prostate: Secondary | ICD-10-CM | POA: Diagnosis not present

## 2017-03-04 DIAGNOSIS — T402X5A Adverse effect of other opioids, initial encounter: Secondary | ICD-10-CM | POA: Diagnosis present

## 2017-03-04 DIAGNOSIS — Z9889 Other specified postprocedural states: Secondary | ICD-10-CM

## 2017-03-04 DIAGNOSIS — I129 Hypertensive chronic kidney disease with stage 1 through stage 4 chronic kidney disease, or unspecified chronic kidney disease: Secondary | ICD-10-CM | POA: Diagnosis present

## 2017-03-04 DIAGNOSIS — Z79899 Other long term (current) drug therapy: Secondary | ICD-10-CM | POA: Diagnosis not present

## 2017-03-04 DIAGNOSIS — G9340 Encephalopathy, unspecified: Secondary | ICD-10-CM | POA: Diagnosis not present

## 2017-03-04 LAB — HEPARIN LEVEL (UNFRACTIONATED)

## 2017-03-04 LAB — COMPREHENSIVE METABOLIC PANEL
ALK PHOS: 85 U/L (ref 38–126)
ALT: 33 U/L (ref 17–63)
ANION GAP: 11 (ref 5–15)
AST: 54 U/L — ABNORMAL HIGH (ref 15–41)
Albumin: 2.6 g/dL — ABNORMAL LOW (ref 3.5–5.0)
BUN: 36 mg/dL — ABNORMAL HIGH (ref 6–20)
CALCIUM: 8.3 mg/dL — AB (ref 8.9–10.3)
CHLORIDE: 101 mmol/L (ref 101–111)
CO2: 23 mmol/L (ref 22–32)
CREATININE: 1.57 mg/dL — AB (ref 0.61–1.24)
GFR, EST AFRICAN AMERICAN: 49 mL/min — AB (ref >60)
GFR, EST NON AFRICAN AMERICAN: 43 mL/min — AB (ref >60)
Glucose, Bld: 113 mg/dL — ABNORMAL HIGH (ref 65–99)
Potassium: 4.5 mmol/L (ref 3.5–5.1)
SODIUM: 135 mmol/L (ref 135–145)
Total Bilirubin: 1.2 mg/dL (ref 0.3–1.2)
Total Protein: 6.2 g/dL — ABNORMAL LOW (ref 6.5–8.1)

## 2017-03-04 LAB — URINALYSIS, ROUTINE W REFLEX MICROSCOPIC
Bilirubin Urine: NEGATIVE
Glucose, UA: NEGATIVE mg/dL
Hgb urine dipstick: NEGATIVE
Ketones, ur: NEGATIVE mg/dL
LEUKOCYTES UA: NEGATIVE
NITRITE: NEGATIVE
PH: 5 (ref 5.0–8.0)
Protein, ur: NEGATIVE mg/dL
SPECIFIC GRAVITY, URINE: 1.029 (ref 1.005–1.030)

## 2017-03-04 LAB — CBC WITH DIFFERENTIAL/PLATELET
BASOS ABS: 0 10*3/uL (ref 0.0–0.1)
BASOS PCT: 0 %
EOS ABS: 0.1 10*3/uL (ref 0.0–0.7)
Eosinophils Relative: 1 %
HEMATOCRIT: 32.7 % — AB (ref 39.0–52.0)
HEMOGLOBIN: 10.5 g/dL — AB (ref 13.0–17.0)
Lymphocytes Relative: 10 %
Lymphs Abs: 1.2 10*3/uL (ref 0.7–4.0)
MCH: 28.7 pg (ref 26.0–34.0)
MCHC: 32.1 g/dL (ref 30.0–36.0)
MCV: 89.3 fL (ref 78.0–100.0)
MONOS PCT: 12 %
Monocytes Absolute: 1.5 10*3/uL — ABNORMAL HIGH (ref 0.1–1.0)
NEUTROS ABS: 9.3 10*3/uL — AB (ref 1.7–7.7)
NEUTROS PCT: 77 %
Platelets: 263 10*3/uL (ref 150–400)
RBC: 3.66 MIL/uL — AB (ref 4.22–5.81)
RDW: 14.4 % (ref 11.5–15.5)
WBC: 12.1 10*3/uL — AB (ref 4.0–10.5)

## 2017-03-04 LAB — CK: Total CK: 94 U/L (ref 49–397)

## 2017-03-04 LAB — LACTIC ACID, PLASMA: Lactic Acid, Venous: 1.4 mmol/L (ref 0.5–1.9)

## 2017-03-04 LAB — PROCALCITONIN: PROCALCITONIN: 0.54 ng/mL

## 2017-03-04 LAB — I-STAT CG4 LACTIC ACID, ED: LACTIC ACID, VENOUS: 1.23 mmol/L (ref 0.5–1.9)

## 2017-03-04 LAB — GLUCOSE, CAPILLARY: Glucose-Capillary: 99 mg/dL (ref 65–99)

## 2017-03-04 LAB — APTT: aPTT: 48 seconds — ABNORMAL HIGH (ref 24–36)

## 2017-03-04 MED ORDER — IOPAMIDOL (ISOVUE-300) INJECTION 61%
INTRAVENOUS | Status: AC
  Administered 2017-03-04: 100 mL
  Filled 2017-03-04: qty 100

## 2017-03-04 MED ORDER — INSULIN ASPART 100 UNIT/ML ~~LOC~~ SOLN: 0.0000 [IU] | Freq: Three times a day (TID) | SUBCUTANEOUS | Status: DC

## 2017-03-04 MED ORDER — METOPROLOL TARTRATE 5 MG/5ML IV SOLN
2.5000 mg | Freq: Three times a day (TID) | INTRAVENOUS | Status: DC
  Administered 2017-03-05 – 2017-03-06 (×2): 2.5 mg via INTRAVENOUS
  Filled 2017-03-04 (×2): qty 5

## 2017-03-04 MED ORDER — ACETAMINOPHEN 650 MG RE SUPP
650.0000 mg | Freq: Four times a day (QID) | RECTAL | Status: DC | PRN
Start: 2017-03-04 — End: 2017-03-07

## 2017-03-04 MED ORDER — HYDRALAZINE HCL 20 MG/ML IJ SOLN: 5.0000 mg | INTRAMUSCULAR | Status: DC | PRN

## 2017-03-04 MED ORDER — SODIUM CHLORIDE 0.9 % IV BOLUS (SEPSIS)
30.0000 mL/kg | Freq: Once | INTRAVENOUS | Status: AC
Start: 2017-03-04 — End: 2017-03-04
  Administered 2017-03-04: 3051 mL via INTRAVENOUS

## 2017-03-04 MED ORDER — SODIUM CHLORIDE 0.9 % IV SOLN
INTRAVENOUS | Status: DC
  Administered 2017-03-04 – 2017-03-05 (×2): via INTRAVENOUS

## 2017-03-04 MED ORDER — INSULIN ASPART 100 UNIT/ML ~~LOC~~ SOLN: 0.0000 [IU] | Freq: Every day | SUBCUTANEOUS | Status: DC

## 2017-03-04 MED ORDER — SODIUM CHLORIDE 0.9 % IV SOLN
1500.0000 mg | INTRAVENOUS | Status: DC
  Administered 2017-03-05: 1500 mg via INTRAVENOUS
  Filled 2017-03-04 (×2): qty 1500

## 2017-03-04 MED ORDER — HYDROCORTISONE NA SUCCINATE PF 100 MG IJ SOLR
50.0000 mg | Freq: Once | INTRAMUSCULAR | Status: AC
Start: 2017-03-04 — End: 2017-03-05
  Administered 2017-03-05: 50 mg via INTRAVENOUS
  Filled 2017-03-04: qty 2

## 2017-03-04 MED ORDER — VANCOMYCIN HCL 10 G IV SOLR
2500.0000 mg | Freq: Once | INTRAVENOUS | Status: AC
  Administered 2017-03-04: 2500 mg via INTRAVENOUS
  Filled 2017-03-04: qty 2500

## 2017-03-04 MED ORDER — PIPERACILLIN-TAZOBACTAM 3.375 G IVPB 30 MIN
3.3750 g | Freq: Once | INTRAVENOUS | Status: AC
Start: 2017-03-04 — End: 2017-03-04
  Administered 2017-03-04: 3.375 g via INTRAVENOUS
  Filled 2017-03-04: qty 50

## 2017-03-04 MED ORDER — PIPERACILLIN-TAZOBACTAM 3.375 G IVPB
3.3750 g | Freq: Three times a day (TID) | INTRAVENOUS | Status: DC
  Administered 2017-03-05 – 2017-03-06 (×5): 3.375 g via INTRAVENOUS
  Filled 2017-03-04 (×7): qty 50

## 2017-03-04 MED ORDER — HYDROXYZINE HCL 50 MG/ML IM SOLN
25.0000 mg | Freq: Four times a day (QID) | INTRAMUSCULAR | Status: DC | PRN
  Filled 2017-03-04: qty 0.5

## 2017-03-04 MED ORDER — ACETAMINOPHEN 325 MG PO TABS
650.0000 mg | ORAL_TABLET | Freq: Four times a day (QID) | ORAL | Status: DC | PRN
  Administered 2017-03-06 (×2): 650 mg via ORAL
  Filled 2017-03-04 (×2): qty 2

## 2017-03-04 MED ORDER — HEPARIN (PORCINE) IN NACL 100-0.45 UNIT/ML-% IJ SOLN
1600.0000 [IU]/h | INTRAMUSCULAR | Status: DC
  Administered 2017-03-04: 1600 [IU]/h via INTRAVENOUS
  Filled 2017-03-04: qty 250

## 2017-03-04 NOTE — ED Provider Notes (Signed)
[facility] DEPT Provider Note   CSN: [id number] Arrival date & time: 03/04/17  1430     History   Chief Complaint Chief Complaint  Patient presents with  . Fall   Level V caveat: Altered mental status  HPI Alex Bowman is a 71 y.o. male.  HPI Patient is a 71 year old male with overall poor health and was a history of Axis I A. fib on anticoagulation as well as chronic kidney disease and severe morbid obesity with chronic bilateral lower extremity edema, diabetes, hypertension, hyperlipidemia who presents to emergency department Mental status.  He was found at the nursing facility today altered and slumped against the wall.  Family reports no significant symptoms this weekend.  History of urinary tract infections before in the past.  Patient unable to provide additional information this time secondary to altered mental status.   Past Medical History:  Diagnosis Date  . Arthritis   . Bilateral lower extremity edema    Chronic, with venous insufficiency  . Cataract    beginning  . Chronic kidney disease    kidney stones  . Complication of anesthesia    stayed awake during colonoscopy  . Diabetes mellitus without complication ([place])    "on medicine, but numbers have been good for years since weight loss"  . Dyslipidemia    Monitored by Primary Physician. On statin  . GERD (gastroesophageal reflux disease)   . Hypertension   . Knee pain    R>L  . Morbid obesity with BMI of 45.0-49.9, adult ([place])    Attempted weight loss efforts at last visit had BMI down to 42, unfortunately back up to 46 with weight going up to 312 lb from 285 lb  . Pneumonia 20 years ago   h/o    Patient Active Problem List   Diagnosis Date Noted  . AF (paroxysmal atrial fibrillation) ([place]) 02/24/2017  . DM type 2 causing CKD stage 3 ([place]) 02/24/2017  . CKD (chronic kidney disease) stage 3, GFR 30-59 ml/min 02/24/2017  . Closed wedge compression fracture of T7 vertebra ([place]) 02/22/2017  .  Anemia 02/22/2017  . Severe obesity (BMI >= 40) ([place]) 03/13/2013  . Dyslipidemia   . Hypertension   . Bilateral lower extremity edema   . Metabolic syndrome 63/87/5643    Past Surgical History:  Procedure Laterality Date  . 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 Mild elevation in LVDP, EF 55%. --> False-positive stress test  . CARDIOVERSION N/A 11/27/2016   Procedure: CARDIOVERSION;  Surgeon: Skeet Latch, MD;  Location: [facility];  Service: Cardiovascular;  Laterality: N/A;  . COLONOSCOPY    . CYSTOSCOPY WITH STENT PLACEMENT Left 04/19/2013   Procedure: CYSTOSCOPY WITH STENT PLACEMENT;  Surgeon: Alexis Frock, MD;  Location: [facility];  Service: Urology;  Laterality: Left;  RIGHT RETROGRADE PYELOGRAM  . CYSTOSCOPY WITH URETEROSCOPY Left 04/21/2013   Procedure: CYSTOSCOPY WITH URETEROSCOPY bilateral retrograde, bilateral stent change, stone extraction;  Surgeon: Alexis Frock, MD;  Location: [facility];  Service: Urology;  Laterality: Left;  bilateral ureters  . HAND SURGERY Bilateral 15 years ago  . HOLMIUM LASER APPLICATION Right 11/08/5186   Procedure: HOLMIUM LASER APPLICATION;  Surgeon: Alexis Frock, MD;  Location: [facility];  Service: Urology;  Laterality: Right;  . NEPHROLITHOTOMY Right 04/19/2013   Procedure: NEPHROLITHOTOMY PERCUTANEOUS;  Surgeon: Alexis Frock, MD;  Location: [facility];  Service: Urology;  Laterality: Right;  . NEPHROLITHOTOMY Right 04/21/2013   Procedure: NEPHROLITHOTOMY PERCUTANEOUS RIGHT  2ND STAGE PERCUTANEOUS NEPHROLITHOTOMY;  Surgeon: Alexis Frock, MD;  Location: [facility];  Service: Urology;  Laterality: Right;  . NM MYOVIEW LTD; [place]  May 2012   Suggested as high risk with 3 times a day 1.2 to, mild to moderate 3 times a day affecting basolateral and mid lateral walls.  [name] POLYPECTOMY         Home Medications    Prior to Admission medications   Medication Sig Start Date End Date Taking? Authorizing  Provider  apixaban (ELIQUIS) 5 MG TABS tablet Take 1 tablet (5 mg total) by mouth 2 (two) times daily. 10/30/16   Sherran Needs, NP  atorvastatin (LIPITOR) 40 MG tablet Take 40 mg by mouth every morning.    [provider]  cephALEXin (KEFLEX) 500 MG capsule Take 1 capsule (500 mg total) by mouth 3 (three) times daily. 02/25/17   Waynetta Pean, PA-C  cholecalciferol (VITAMIN D) 1000 UNITS tablet Take 1,000 Units by mouth daily.    [provider]  furosemide (LASIX) 20 MG tablet Take 20 mg by mouth daily.    [provider]  lisinopril (PRINIVIL,ZESTRIL) 20 MG tablet TAKE 1 TABLET BY MOUTH TWICE DAILY 07/11/14   [provider]  metFORMIN (GLUCOPHAGE) 500 MG tablet Take 500 mg by mouth 2 (two) times daily with a meal.    [provider]  metoprolol succinate (TOPROL-XL) 50 MG 24 hr tablet Take 75mg  (1 and 1/2 tablets) twice a day by mouth 12/04/16   Sherran Needs, NP  Multiple Vitamin (MULTIVITAMIN WITH MINERALS) TABS tablet Take 1 tablet by mouth daily.    [provider]  oxyCODONE (OXY IR/ROXICODONE) 5 MG immediate release tablet Take 1 tablet (5 mg total) by mouth every 4 (four) hours as needed for moderate pain. 02/24/17   Debbe Odea, MD  oxyCODONE 10 MG TABS Take 1 tablet (10 mg total) by mouth every 4 (four) hours as needed for severe pain. 02/24/17   Debbe Odea, MD  polyethylene glycol (MIRALAX / GLYCOLAX) packet Take 17 g by mouth daily. Hold for diarrhae 02/24/17   Debbe Odea, MD  potassium chloride (MICRO-K) 10 MEQ CR capsule Take 10 mEq by mouth daily.    [provider]  senna (SENOKOT) 8.6 MG TABS tablet Take 2 tablets (17.2 mg total) by mouth at bedtime. 02/24/17   Debbe Odea, MD  sertraline (ZOLOFT) 50 MG tablet Take 50 mg by mouth daily.    [provider]  solifenacin (VESICARE) 5 MG tablet Take 5 mg by mouth daily.    [provider]  zolpidem (AMBIEN CR) 6.25 MG CR tablet Take 6.25 mg by  mouth at bedtime as needed for sleep.    [provider]    Family History Family History  Problem Relation Age of Onset  . Breast cancer Mother   . Prostate cancer Father   . Colon cancer Neg Hx     Social History Social History  Substance Use Topics  . Smoking status: Former Smoker    Packs/day: 1.00    Years: 30.00    Types: Cigarettes    Quit date: 03/02/1997  . Smokeless tobacco: Former Systems developer    Types: Chew    Quit date: 08/12/2002  . Alcohol use No     Allergies   Patient has no known allergies.   Review of Systems Review of Systems  Unable to perform ROS: Mental status change     Physical Exam Updated Vital Signs BP [name])  115/57   Pulse 70   Temp 97.7 F (36.5 C) (Oral)   Resp 18   SpO2 96%   Physical Exam   ED Treatments / Results  Labs (all labs ordered are listed, but only abnormal results are displayed) Labs Reviewed  COMPREHENSIVE METABOLIC PANEL - Abnormal; Notable for the following:       Result Value   Glucose, Bld 113 (*)    BUN 36 (*)    Creatinine, Ser 1.57 (*)    Calcium 8.3 (*)    Total Protein 6.2 (*)    Albumin 2.6 (*)    AST 54 (*)    GFR calc non Af Amer 43 (*)    GFR calc Af Amer 49 (*)    All other components within normal limits  CBC WITH DIFFERENTIAL/PLATELET - Abnormal; Notable for the following:    WBC 12.1 (*)    RBC 3.66 (*)    Hemoglobin 10.5 (*)    HCT 32.7 (*)    Neutro Abs 9.3 (*)    Monocytes Absolute 1.5 (*)    All other components within normal limits  CULTURE, BLOOD (ROUTINE X 2)  CULTURE, BLOOD (ROUTINE X 2)  URINALYSIS, ROUTINE W REFLEX MICROSCOPIC  I-STAT CG4 LACTIC ACID, ED  I-STAT CG4 LACTIC ACID, ED   BUN  Date Value Ref Range Status  03/04/2017 36 (H) 6 - 20 mg/dL Final  02/25/2017 21 (H) 6 - 20 mg/dL Final  02/22/2017 26 (H) 6 - 20 mg/dL Final  02/21/2017 28 (H) 6 - 20 mg/dL Final   Creatinine, Ser  Date Value Ref Range Status  03/04/2017 1.57 (H) 0.61 - 1.24 mg/dL Final    02/25/2017 1.34 (H) 0.61 - 1.24 mg/dL Final  02/22/2017 1.69 (H) 0.61 - 1.24 mg/dL Final  02/21/2017 1.87 (H) 0.61 - 1.24 mg/dL Final      EKG  EKG Interpretation None       Radiology Ct Head Wo Contrast  Result Date: 03/04/2017 CLINICAL DATA:  Ct head/cspine wo, fall, confusion, best images possible due to pt movement ^149mL ISOVUE-300 IOPAMIDOL (ISOVUE-300) INJECTION 61%; Ct head, fall, confusion Ct head, fall, confusion Ct head/cspine wo, fall, confusion, best images possible due to pt movement EXAM: CT HEAD WITHOUT CONTRAST CT CERVICAL SPINE WITHOUT CONTRAST TECHNIQUE: Multidetector CT imaging of the head and cervical spine was performed following the standard protocol without intravenous contrast. Multiplanar CT image reconstructions of the cervical spine were also generated. COMPARISON:  02/21/2017 FINDINGS: CT HEAD FINDINGS Brain: There is central and cortical atrophy. Periventricular white matter changes are consistent with small vessel disease. There is no intra or extra-axial fluid collection or mass lesion. The basilar cisterns and ventricles have a normal appearance. There is no CT evidence for acute infarction or hemorrhage. Vascular: There is atherosclerotic calcification of the carotid siphons. Skull: Normal. Negative for fracture or focal lesion. Sinuses/Orbits: No acute finding. Other: None CT CERVICAL SPINE FINDINGS Alignment: There is loss of cervical lordosis. This may be secondary to splinting, soft tissue injury, or positioning. Skull base and vertebrae: No acute fracture. No primary bone lesion or focal pathologic process. Soft tissues and spinal canal: No prevertebral fluid or swelling. No visible canal hematoma. Disc levels: Significant disc height loss and mid cervical levels, predominantly at C4-5, C5-6, and C6-7. Upper chest: Negative. Other: Study quality is degraded by significant patient motion artifact. IMPRESSION: 1. Atrophy and small vessel disease. 2.  No  evidence for acute intracranial abnormality. 3. Cervical spine exam is  significantly degraded by patient motion artifact. No obvious abnormality identified . Electronically Signed   By: Nolon Nations M.D.   On: 03/04/2017 17:47   Ct Cervical Spine Wo Contrast  Result Date: 03/04/2017 CLINICAL DATA:  Ct head/cspine wo, fall, confusion, best images possible due to pt movement ^127mL ISOVUE-300 IOPAMIDOL (ISOVUE-300) INJECTION 61%; Ct head, fall, confusion Ct head, fall, confusion Ct head/cspine wo, fall, confusion, best images possible due to pt movement EXAM: CT HEAD WITHOUT CONTRAST CT CERVICAL SPINE WITHOUT CONTRAST TECHNIQUE: Multidetector CT imaging of the head and cervical spine was performed following the standard protocol without intravenous contrast. Multiplanar CT image reconstructions of the cervical spine were also generated. COMPARISON:  02/21/2017 FINDINGS: CT HEAD FINDINGS Brain: There is central and cortical atrophy. Periventricular white matter changes are consistent with small vessel disease. There is no intra or extra-axial fluid collection or mass lesion. The basilar cisterns and ventricles have a normal appearance. There is no CT evidence for acute infarction or hemorrhage. Vascular: There is atherosclerotic calcification of the carotid siphons. Skull: Normal. Negative for fracture or focal lesion. Sinuses/Orbits: No acute finding. Other: None CT CERVICAL SPINE FINDINGS Alignment: There is loss of cervical lordosis. This may be secondary to splinting, soft tissue injury, or positioning. Skull base and vertebrae: No acute fracture. No primary bone lesion or focal pathologic process. Soft tissues and spinal canal: No prevertebral fluid or swelling. No visible canal hematoma. Disc levels: Significant disc height loss and mid cervical levels, predominantly at C4-5, C5-6, and C6-7. Upper chest: Negative. Other: Study quality is degraded by significant patient motion artifact. IMPRESSION: 1.  Atrophy and small vessel disease. 2.  No evidence for acute intracranial abnormality. 3. Cervical spine exam is significantly degraded by patient motion artifact. No obvious abnormality identified . Electronically Signed   By: Nolon Nations M.D.   On: 03/04/2017 17:47   Ct Abdomen Pelvis W Contrast  Result Date: 03/04/2017 CLINICAL DATA:  Fall.  Pain.  Confusion. EXAM: CT ABDOMEN AND PELVIS WITH CONTRAST TECHNIQUE: Multidetector CT imaging of the abdomen and pelvis was performed using the standard protocol following bolus administration of intravenous contrast. CONTRAST:  80 cc ISOVUE-300 IOPAMIDOL (ISOVUE-300) INJECTION 61% COMPARISON:  CT abdomen and pelvis on 04/23/2013 FINDINGS: Lower chest: There are small bilateral pleural effusions, right greater than left. Bibasilar atelectasis is present. No visible pneumothorax. There is significant three-vessel coronary artery disease. Hepatobiliary: Liver is homogeneous without focal lesion. Gallbladder is distended and otherwise normal in appearance. Pancreas: Unremarkable. No pancreatic ductal dilatation or surrounding inflammatory changes. Spleen: Normal in size without focal abnormality. Adrenals/Urinary Tract: Adrenal glands are normal in appearance. There is a new 2 mm nonobstructing intrarenal calcification in the midpole region of the right kidney. There is a 1 cm cyst within the midpole region of the right kidney. No hydronephrosis or ureteral obstruction. No suspicious renal mass. Urinary bladder is normal in appearance. Stomach/Bowel: Stomach and small bowel loops are normal in appearance. There is a large amount of stool throughout mildly distended loops of colon. Scattered diverticula are present. No acute diverticulitis. The appendix is well seen and has a normal appearance. Vascular/Lymphatic: There is moderate atherosclerosis of the abdominal aorta not associated with aneurysm. Nonspecific, small femoral lymph nodes are present. No significant  adenopathy. Although atherosclerotic, there is normal vascular opacification of the celiac axis, superior mesenteric artery, and inferior mesenteric artery. Normal appearance of the portal venous system and inferior vena cava. Reproductive: Normal appearance of the seminal vesicles. Prostate contains calcifications  and is normal in size. Other: Small fat containing paraumbilical hernia. Musculoskeletal: Partially imaged is T7 fracture, evaluated on recent CT 02/21/2017. No interval fractures are identified. IMPRESSION: 1. Bilateral pleural effusions and bibasilar atelectasis. 2. Partially imaged T7 fracture, recently evaluated. 3. No evidence for acute injury of the abdomen or pelvis. 4. Coronary artery disease. 5.  Aortic atherosclerosis. 6. Large stool burden. Electronically Signed   By: Nolon Nations M.D.   On: 03/04/2017 18:04   Dg Chest Port 1 View  Result Date: 03/04/2017 CLINICAL DATA:  Altered mental status EXAM: PORTABLE CHEST 1 VIEW COMPARISON:  10/24/2016 FINDINGS: Low lung volumes with patchy atelectasis at the bases. Mild cardiomegaly with mild central congestion. Probable tiny right pleural effusion. Aortic atherosclerosis. Mildly nodular right perihilar opacity could relate to vascular structure. No pneumothorax. IMPRESSION: 1. Low lung volumes with patchy atelectasis at the bases and possible tiny right effusion 2. Stable cardiomegaly with mild central congestion Electronically Signed   By: Donavan Foil M.D.   On: 03/04/2017 15:16    Procedures Procedures (including critical care time)  Medications Ordered in ED Medications  sodium chloride 0.9 % bolus 3,051 mL (3,051 mLs Intravenous New Bag/Given 03/04/17 1528)  vancomycin (VANCOCIN) 2,500 mg in sodium chloride 0.9 % 500 mL IVPB (2,500 mg Intravenous New Bag/Given 03/04/17 1626)  piperacillin-tazobactam (ZOSYN) IVPB 3.375 g (0 g Intravenous Stopped 03/04/17 1740)  iopamidol (ISOVUE-300) 61 % injection (100 mLs  Contrast Given 03/04/17  1707)     Initial Impression / Assessment and Plan / ED Course  I have reviewed the triage vital signs and the nursing notes.  Pertinent labs & imaging results that were available during my care of the patient were reviewed by me and considered in my medical decision making (see chart for details).     Nuclear etiology found for what is suspected delirium at this time.  Hypotension improved with IV fluid bolus.  Broad-spectrum antibiotics now for suspected sepsis.  In and out cath urine pending. Care to Dr Theodosia Blender  Final Clinical Impressions(s) / ED Diagnoses   Final diagnoses:  Hypotension, unspecified hypotension type  Sepsis, due to unspecified organism [facility])  Altered mental status, unspecified altered mental status type    New Prescriptions New Prescriptions   No medications on file     Jola Schmidt, MD 03/04/17 [phone]

## 2017-03-04 NOTE — Progress Notes (Signed)
Pharmacy Antibiotic Note Alex Bowman is a 71 y.o. male admitted on 03/04/2017 with AMS and concern for sepsis.  Pharmacy has been consulted for Zosyn and vancomycin dosing.  Plan: 1. Vancomycin 1500 IV every 24 hours.  Goal trough 15-20 mcg/mL. 2. Zosyn 3.375g IV q8h (4 hour infusion).  3. Follow up culture data and narrow broad spectrum antibiotics as feasible.    Temp (24hrs), Avg:97.7 F (36.5 C), Min:97.7 F (36.5 C), Max:97.7 F (36.5 C)   Recent Labs Lab 03/04/17 1620 03/04/17 1627  WBC 12.1*  --   CREATININE 1.57*  --   LATICACIDVEN  --  1.23    Estimated Creatinine Clearance: 62.1 mL/min (A) (by C-G formula based on SCr of 1.57 mg/dL (H)).    No Known Allergies  Antimicrobials this admission: 7/24 Zosyn >>  7/24 vancomycin >>   Microbiology results: 7/24 BCx: px 7/24 UCx: px   Thank you for allowing pharmacy to be a part of this patient's care.  Vincenza Hews, PharmD, BCPS 03/04/2017, 8:15 PM

## 2017-03-04 NOTE — Progress Notes (Signed)
[facility] for apixaban PTA >> heparin  Indication: atrial fibrillation  No Known Allergies  Patient Measurements:   Heparin Dosing Weight: 107 kg  Vital Signs: Temp: 97.7 F (36.5 C) (07/24 1505) Temp Source: Oral (07/24 1505) BP: 122/83 (07/24 1930) Pulse Rate: 58 (07/24 1945)  Labs:  Recent Labs  03/04/17 1620  HGB 10.5*  HCT 32.7*  PLT 263  CREATININE 1.57*    Estimated Creatinine Clearance: 62.1 mL/min (A) (by C-G formula based on SCr of 1.57 mg/dL (H)).    Assessment: 71 yo male on apixaban prior to arrival for history of atrial fibrillation admitted 7/24 with AMS. Unable to tolerate PO and to transition to heparin infusion. Last dose of apixaban was on 7/24 at 11:25 am. CBC stable. Baseline heparin level and aPTT pending.   Goal of Therapy:  Heparin level 0.3-0.7 units/ml aPTT 66-102 seconds Monitor platelets by anticoagulation protocol: Yes   Plan:  1. Begin heparin infusion at 1600 units/hr starting at 23:30 (when next dose of apixaban would have been due) 2. Obtain aPTT 8 hours after starting heparin infusion 3. Daily and aPTT and heparin level until labs are correlating   Vincenza Hews, PharmD, BCPS 03/04/2017, 8:21 PM

## 2017-03-04 NOTE — H&P (Addendum)
History and Physical    Alex Bowman TKP:[id number] DOB: 1946-03-16 DOA: 03/04/2017  Referring MD/NP/PA:   PCP: [name], MD   Patient coming from:  The patient is coming from rehabilitation.  At baseline, Alex Bowman is dependent for most of ADL.  Chief Complaint: AMS and fall  HPI: Alex Bowman is a 71 y.o. male with medical history significant of hypertension, hyperlipidemia, diabetes mellitus, obesity, GERD, depression, CKD-3, atrial fibrillation on Eliquis, recent closed wedge compression fracture of T7 vertebra, who presents with altered mental status and fall.  Per patient's wife, patient's had  closed wedge compression fracture of T7 vertebra recently and is currently doing rehabilitation at a rehabilitation facility. Per report from her nursing home, patient rolled out of his bed and fell on the floor in AM. Not sure about LOC, but Alex Bowman is confused and became less responsible. Per his wife, patient does not seem to have chest pain, shortness breath, cough. No active nausea, vomiting, diarrhea noted. Patient does not seem to have abdominal pain. No facial droop noted. His wife states that the patient had bloody urine recently, not sure if patient has any symptoms of UTI. Patient moves all extremities.  ED Course: Alex Bowman was found to have WBC 12.1, lactate 1.23, slightly worsening renal function, temperature normal, bradycardia, ETC. 94% on room air, hypotension with blood pressure 71/60, which improved to 105/96 after normal saline bolus in ED. CT head and C-spine are negative for acute abnormalities. CT abdomen/pelvis showed small bilateral pleural effusion and large stool, but no acute abnormalities intra-abdominally. Patient is admitted to stepdown as inpatient.  Review of Systems: Could not be reviewed due to altered mental status. Allergy: No Known Allergies  Past Medical History:  Diagnosis Date  . Arthritis   . Bilateral lower extremity edema    Chronic, with venous insufficiency  .  Cataract    beginning  . Chronic kidney disease    kidney stones  . Complication of anesthesia    stayed awake during colonoscopy  . Diabetes mellitus without complication ([place])    "on medicine, but numbers have been good for years since weight loss"  . Dyslipidemia    Monitored by Primary Physician. On statin  . GERD (gastroesophageal reflux disease)   . Hypertension   . Knee pain    R>L  . Morbid obesity with BMI of 45.0-49.9, adult ([place])    Attempted weight loss efforts at last visit had BMI down to 42, unfortunately back up to 46 with weight going up to 312 lb from 285 lb  . Pneumonia 20 years ago   h/o    Past Surgical History:  Procedure Laterality Date  . 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 Mild elevation in LVDP, EF 55%. --> False-positive stress test  . CARDIOVERSION N/A 11/27/2016   Procedure: CARDIOVERSION;  Surgeon: Skeet Latch, MD;  Location: [facility];  Service: Cardiovascular;  Laterality: N/A;  . COLONOSCOPY    . CYSTOSCOPY WITH STENT PLACEMENT Left 04/19/2013   Procedure: CYSTOSCOPY WITH STENT PLACEMENT;  Surgeon: Alexis Frock, MD;  Location: [facility];  Service: Urology;  Laterality: Left;  RIGHT RETROGRADE PYELOGRAM  . CYSTOSCOPY WITH URETEROSCOPY Left 04/21/2013   Procedure: CYSTOSCOPY WITH URETEROSCOPY bilateral retrograde, bilateral stent change, stone extraction;  Surgeon: Alexis Frock, MD;  Location: [facility];  Service: Urology;  Laterality: Left;  bilateral ureters  . HAND SURGERY Bilateral 15 years ago  . HOLMIUM LASER APPLICATION Right 12/27/15  Procedure: HOLMIUM LASER APPLICATION;  Surgeon: Alexis Frock, MD;  Location: [facility];  Service: Urology;  Laterality: Right;  . NEPHROLITHOTOMY Right 04/19/2013   Procedure: NEPHROLITHOTOMY PERCUTANEOUS;  Surgeon: Alexis Frock, MD;  Location: [facility];  Service: Urology;  Laterality: Right;  . NEPHROLITHOTOMY Right 04/21/2013   Procedure: NEPHROLITHOTOMY  PERCUTANEOUS RIGHT 2ND STAGE PERCUTANEOUS NEPHROLITHOTOMY;  Surgeon: Alexis Frock, MD;  Location: [facility];  Service: Urology;  Laterality: Right;  . NM MYOVIEW LTD; Persantine  May 2012   Suggested as high risk with 3 times a day 1.2 to, mild to moderate 3 times a day affecting basolateral and mid lateral walls.  [name] POLYPECTOMY      Social History:  reports that he quit smoking about 20 years ago. His smoking use included Cigarettes. He has a 30.00 pack-year smoking history. He quit smokeless tobacco use about 14 years ago. His smokeless tobacco use included Chew. He reports that he does not drink alcohol or use drugs.  Family History:  Family History  Problem Relation Age of Onset  . Breast cancer Mother   . Prostate cancer Father   . Colon cancer Neg Hx      Prior to Admission medications   Medication Sig Start Date End Date Taking? Authorizing Provider  apixaban (ELIQUIS) 5 MG TABS tablet Take 1 tablet (5 mg total) by mouth 2 (two) times daily. 10/30/16   Sherran Needs, NP  atorvastatin (LIPITOR) 40 MG tablet Take 40 mg by mouth every morning.    [provider]  cephALEXin (KEFLEX) 500 MG capsule Take 1 capsule (500 mg total) by mouth 3 (three) times daily. 02/25/17   Waynetta Pean, PA-C  cholecalciferol (VITAMIN D) 1000 UNITS tablet Take 1,000 Units by mouth daily.    [provider]  furosemide (LASIX) 20 MG tablet Take 20 mg by mouth daily.    [provider]  lisinopril (PRINIVIL,ZESTRIL) 20 MG tablet TAKE 1 TABLET BY MOUTH TWICE DAILY 07/11/14   [provider]  metFORMIN (GLUCOPHAGE) 500 MG tablet Take 500 mg by mouth 2 (two) times daily with a meal.    [provider]  metoprolol succinate (TOPROL-XL) 50 MG 24 hr tablet Take 75mg  (1 and 1/2 tablets) twice a day by mouth 12/04/16   Sherran Needs, NP  Multiple Vitamin (MULTIVITAMIN WITH MINERALS) TABS tablet Take 1 tablet by mouth daily.    [provider]  oxyCODONE (OXY  IR/ROXICODONE) 5 MG immediate release tablet Take 1 tablet (5 mg total) by mouth every 4 (four) hours as needed for moderate pain. 02/24/17   Debbe Odea, MD  oxyCODONE 10 MG TABS Take 1 tablet (10 mg total) by mouth every 4 (four) hours as needed for severe pain. 02/24/17   Debbe Odea, MD  polyethylene glycol (MIRALAX / GLYCOLAX) packet Take 17 g by mouth daily. Hold for diarrhae 02/24/17   Debbe Odea, MD  potassium chloride (MICRO-K) 10 MEQ CR capsule Take 10 mEq by mouth daily.    [provider]  senna (SENOKOT) 8.6 MG TABS tablet Take 2 tablets (17.2 mg total) by mouth at bedtime. 02/24/17   Debbe Odea, MD  sertraline (ZOLOFT) 50 MG tablet Take 50 mg by mouth daily.    [provider]  solifenacin (VESICARE) 5 MG tablet Take 5 mg by mouth daily.    [provider]  zolpidem (AMBIEN CR) 6.25 MG CR tablet Take 6.25 mg by mouth at bedtime as needed for sleep.    [provider]    Physical Exam: Vitals:   03/04/17 1930 03/04/17 1945 03/04/17 2030 03/04/17 2210  BP: 122/83  135/67 107/74  Pulse: (!) 58 (!) 58 (!) 59   Resp: 20 20 19    Temp:    (!) 97.5 F (36.4 C)  TempSrc:    Oral  SpO2: 99% 98% 100% 100%  Weight:    (!) 139.3 kg (307 lb 1.6 oz)  Height:    5\' 10"  (1.778 m)   General: Not in acute distress HEENT:       Eyes: PERRL, EOMI, no scleral icterus.       ENT: No discharge from the ears and nose      Neck: No JVD, no bruit, no mass felt. Heme: No neck lymph node enlargement. Cardiac: S1/S2, RRR, No murmurs, No gallops or rubs. Respiratory:  No rales, wheezing, rhonchi or rubs. GI: Soft, nondistended, nontender, no organomegaly, BS present. GU: No hematuria Ext: has 1+ pitting leg edema bilaterally. 2+DP/Alex Bowman pulse bilaterally. Musculoskeletal: No joint deformities, No joint redness or warmth, no limitation of ROM in spin. Skin: No rashes.  Neuro: confused, not oriented X3, cranial nerves II-XII grossly intact, moves all  extremities upon painful stimuli Psych: Patient is not psychotic, no suicidal or hemocidal ideation.  Labs on Admission: I have personally reviewed following labs and imaging studies  CBC:  Recent Labs Lab 03/04/17 1620  WBC 12.1*  NEUTROABS 9.3*  HGB 10.5*  HCT 32.7*  MCV 89.3  PLT 381   Basic Metabolic Panel:  Recent Labs Lab 03/04/17 1620  NA 135  K 4.5  CL 101  CO2 23  GLUCOSE 113*  BUN 36*  CREATININE 1.57*  CALCIUM 8.3*   GFR: Estimated Creatinine Clearance: 60.7 mL/min (A) (by C-G formula based on SCr of 1.57 mg/dL (H)). Liver Function Tests:  Recent Labs Lab 03/04/17 1620  AST 54*  ALT 33  ALKPHOS 85  BILITOT 1.2  PROT 6.2*  ALBUMIN 2.6*   No results for input(s): LIPASE, AMYLASE in the last 168 hours. No results for input(s): AMMONIA in the last 168 hours. Coagulation Profile: No results for input(s): INR, PROTIME in the last 168 hours. Cardiac Enzymes:  Recent Labs Lab 03/04/17 2000  CKTOTAL 94   BNP (last 3 results) No results for input(s): PROBNP in the last 8760 hours. HbA1C: No results for input(s): HGBA1C in the last 72 hours. CBG: No results for input(s): GLUCAP in the last 168 hours. Lipid Profile: No results for input(s): CHOL, HDL, LDLCALC, TRIG, CHOLHDL, LDLDIRECT in the last 72 hours. Thyroid Function Tests: No results for input(s): TSH, T4TOTAL, FREET4, T3FREE, THYROIDAB in the last 72 hours. Anemia Panel: No results for input(s): VITAMINB12, FOLATE, FERRITIN, TIBC, IRON, RETICCTPCT in the last 72 hours. Urine analysis:    Component Value Date/Time   COLORURINE YELLOW 03/04/2017 1938   APPEARANCEUR CLEAR 03/04/2017 1938   LABSPEC 1.029 03/04/2017 1938   PHURINE 5.0 03/04/2017 1938   GLUCOSEU NEGATIVE 03/04/2017 1938   HGBUR NEGATIVE 03/04/2017 1938   BILIRUBINUR NEGATIVE 03/04/2017 1938   KETONESUR NEGATIVE 03/04/2017 1938   PROTEINUR NEGATIVE 03/04/2017 1938   UROBILINOGEN 0.2 04/23/2013 1619   NITRITE NEGATIVE  03/04/2017 1938   LEUKOCYTESUR NEGATIVE 03/04/2017 1938   Sepsis Labs: @LABRCNTIP (procalcitonin:4,lacticidven:4) ) Recent Results (from the past 240 hour(s))  Urine Culture     Status: Abnormal   Collection Time: 02/25/17  2:56 PM  Result Value Ref Range Status   Specimen Description URINE, RANDOM  Final  Special Requests NONE  Final   Culture MULTIPLE SPECIES PRESENT, SUGGEST RECOLLECTION (A)  Final   Report Status 02/27/2017 FINAL  Final     Radiological Exams on Admission: Ct Head Wo Contrast  Result Date: 03/04/2017 CLINICAL DATA:  Ct head/cspine wo, fall, confusion, best images possible due to Alex Bowman movement ^127mL ISOVUE-300 IOPAMIDOL (ISOVUE-300) INJECTION 61%; Ct head, fall, confusion Ct head, fall, confusion Ct head/cspine wo, fall, confusion, best images possible due to Alex Bowman movement EXAM: CT HEAD WITHOUT CONTRAST CT CERVICAL SPINE WITHOUT CONTRAST TECHNIQUE: Multidetector CT imaging of the head and cervical spine was performed following the standard protocol without intravenous contrast. Multiplanar CT image reconstructions of the cervical spine were also generated. COMPARISON:  02/21/2017 FINDINGS: CT HEAD FINDINGS Brain: There is central and cortical atrophy. Periventricular white matter changes are consistent with small vessel disease. There is no intra or extra-axial fluid collection or mass lesion. The basilar cisterns and ventricles have a normal appearance. There is no CT evidence for acute infarction or hemorrhage. Vascular: There is atherosclerotic calcification of the carotid siphons. Skull: Normal. Negative for fracture or focal lesion. Sinuses/Orbits: No acute finding. Other: None CT CERVICAL SPINE FINDINGS Alignment: There is loss of cervical lordosis. This may be secondary to splinting, soft tissue injury, or positioning. Skull base and vertebrae: No acute fracture. No primary bone lesion or focal pathologic process. Soft tissues and spinal canal: No prevertebral fluid or  swelling. No visible canal hematoma. Disc levels: Significant disc height loss and mid cervical levels, predominantly at C4-5, C5-6, and C6-7. Upper chest: Negative. Other: Study quality is degraded by significant patient motion artifact. IMPRESSION: 1. Atrophy and small vessel disease. 2.  No evidence for acute intracranial abnormality. 3. Cervical spine exam is significantly degraded by patient motion artifact. No obvious abnormality identified . Electronically Signed   By: Nolon Nations M.D.   On: 03/04/2017 17:47   Ct Cervical Spine Wo Contrast  Result Date: 03/04/2017 CLINICAL DATA:  Ct head/cspine wo, fall, confusion, best images possible due to Alex Bowman movement ^177mL ISOVUE-300 IOPAMIDOL (ISOVUE-300) INJECTION 61%; Ct head, fall, confusion Ct head, fall, confusion Ct head/cspine wo, fall, confusion, best images possible due to Alex Bowman movement EXAM: CT HEAD WITHOUT CONTRAST CT CERVICAL SPINE WITHOUT CONTRAST TECHNIQUE: Multidetector CT imaging of the head and cervical spine was performed following the standard protocol without intravenous contrast. Multiplanar CT image reconstructions of the cervical spine were also generated. COMPARISON:  02/21/2017 FINDINGS: CT HEAD FINDINGS Brain: There is central and cortical atrophy. Periventricular white matter changes are consistent with small vessel disease. There is no intra or extra-axial fluid collection or mass lesion. The basilar cisterns and ventricles have a normal appearance. There is no CT evidence for acute infarction or hemorrhage. Vascular: There is atherosclerotic calcification of the carotid siphons. Skull: Normal. Negative for fracture or focal lesion. Sinuses/Orbits: No acute finding. Other: None CT CERVICAL SPINE FINDINGS Alignment: There is loss of cervical lordosis. This may be secondary to splinting, soft tissue injury, or positioning. Skull base and vertebrae: No acute fracture. No primary bone lesion or focal pathologic process. Soft tissues and  spinal canal: No prevertebral fluid or swelling. No visible canal hematoma. Disc levels: Significant disc height loss and mid cervical levels, predominantly at C4-5, C5-6, and C6-7. Upper chest: Negative. Other: Study quality is degraded by significant patient motion artifact. IMPRESSION: 1. Atrophy and small vessel disease. 2.  No evidence for acute intracranial abnormality. 3. Cervical spine exam is significantly degraded by patient motion artifact.  No obvious abnormality identified . Electronically Signed   By: Nolon Nations M.D.   On: 03/04/2017 17:47   Ct Abdomen Pelvis W Contrast  Result Date: 03/04/2017 CLINICAL DATA:  Fall.  Pain.  Confusion. EXAM: CT ABDOMEN AND PELVIS WITH CONTRAST TECHNIQUE: Multidetector CT imaging of the abdomen and pelvis was performed using the standard protocol following bolus administration of intravenous contrast. CONTRAST:  80 cc ISOVUE-300 IOPAMIDOL (ISOVUE-300) INJECTION 61% COMPARISON:  CT abdomen and pelvis on 04/23/2013 FINDINGS: Lower chest: There are small bilateral pleural effusions, right greater than left. Bibasilar atelectasis is present. No visible pneumothorax. There is significant three-vessel coronary artery disease. Hepatobiliary: Liver is homogeneous without focal lesion. Gallbladder is distended and otherwise normal in appearance. Pancreas: Unremarkable. No pancreatic ductal dilatation or surrounding inflammatory changes. Spleen: Normal in size without focal abnormality. Adrenals/Urinary Tract: Adrenal glands are normal in appearance. There is a new 2 mm nonobstructing intrarenal calcification in the midpole region of the right kidney. There is a 1 cm cyst within the midpole region of the right kidney. No hydronephrosis or ureteral obstruction. No suspicious renal mass. Urinary bladder is normal in appearance. Stomach/Bowel: Stomach and small bowel loops are normal in appearance. There is a large amount of stool throughout mildly distended loops of colon.  Scattered diverticula are present. No acute diverticulitis. The appendix is well seen and has a normal appearance. Vascular/Lymphatic: There is moderate atherosclerosis of the abdominal aorta not associated with aneurysm. Nonspecific, small femoral lymph nodes are present. No significant adenopathy. Although atherosclerotic, there is normal vascular opacification of the celiac axis, superior mesenteric artery, and inferior mesenteric artery. Normal appearance of the portal venous system and inferior vena cava. Reproductive: Normal appearance of the seminal vesicles. Prostate contains calcifications and is normal in size. Other: Small fat containing paraumbilical hernia. Musculoskeletal: Partially imaged is T7 fracture, evaluated on recent CT 02/21/2017. No interval fractures are identified. IMPRESSION: 1. Bilateral pleural effusions and bibasilar atelectasis. 2. Partially imaged T7 fracture, recently evaluated. 3. No evidence for acute injury of the abdomen or pelvis. 4. Coronary artery disease. 5.  Aortic atherosclerosis. 6. Large stool burden. Electronically Signed   By: Nolon Nations M.D.   On: 03/04/2017 18:04   Dg Chest Port 1 View  Result Date: 03/04/2017 CLINICAL DATA:  Altered mental status EXAM: PORTABLE CHEST 1 VIEW COMPARISON:  10/24/2016 FINDINGS: Low lung volumes with patchy atelectasis at the bases. Mild cardiomegaly with mild central congestion. Probable tiny right pleural effusion. Aortic atherosclerosis. Mildly nodular right perihilar opacity could relate to vascular structure. No pneumothorax. IMPRESSION: 1. Low lung volumes with patchy atelectasis at the bases and possible tiny right effusion 2. Stable cardiomegaly with mild central congestion Electronically Signed   By: Donavan Foil M.D.   On: 03/04/2017 15:16     EKG: Independently reviewed. Sinus rhythm, QTC 523, low voltage.   Assessment/Plan Principal Problem:   Sepsis ([place]) Active Problems:   Severe obesity (BMI >= 40)  (HCC)   Dyslipidemia   Hypertension   Closed wedge compression fracture of T7 vertebra ([facility])   AF (paroxysmal atrial fibrillation) (HCC)   DM type 2 causing CKD stage 3 (HCC)   CKD (chronic kidney disease) stage 3, GFR 30-59 ml/min   Fall   Depression   Acute metabolic encephalopathy   Sepsis [facility]): Patient is septic with leukocytosis and hypotension. Blood pressure responded to IV fluid, improved to 105/96 after 3 L normal saline bolus. Currently hemodynamically stable. Source of infection is not clear. CXR has  no infiltration. CT abdomen/pelvis did not show anything for abdominally, but showed small bilateral pleural effusion.   - will admit to SDU as inpt - Empiric antimicrobial treatment with vancomycin and Zosyn per pharmacy - Blood cultures x 2  - will get Procalcitonin and trend lactic acid levels per sepsis protocol. - IVF: 3.05 L of NS bolus in ED, followed by 100 cc/h  Hypotension: Likely due to sepsis, but also need to rule out adrenal insufficiency -Solu Cortef 50 mg 1 -Check cortisol level    Acute metabolic encephalopathy: Likely due to sepsis. CT head and C-spine is negative for acute abnormalities. -Treat sepsis as above -Frequent neuro check -Hold all home oral medications now.  HLD: -hold lipitor until mental status improves  HTN: -hold oral meds until mental status improves -IV hydralazine when necessary  Recent closed wedge compression fracture of T7 vertebra ([place]): -will need to continue Alex Bowman/OT when mental status improves   Atrial Fibrillation: CHA2DS2-VASc Score is 3, needs oral anticoagulation. Patient is on Eliquis at home. Heart rate is well controlled. -switch Eliquis to IV heparin -switch oral metoprolol to IV metoprolol with holding parameters for SBP<100 and HR<65   DM-II: Last A1c nor on record. Patient is taking metformin at home -SSI  CKD (chronic kidney disease) stage 3: Slightly worsening renal function. Baseline creatinine 1.2-1.3.  Patient is complaining is 1.57, BUN 36. -IV fluid as above -Lisinopril and lasix are on hold -Follow-up renal function by BMP  Depression and anxiety: Stable, no suicidal or homicidal ideations. -hold home meds until mental status improves  Fall: Unclear etiology. Patient moves all extremities upon painful stimuli. No facial droop noted. CT head and C-spine are negative for acute abnormalities. -Alex Bowman OT when able to -check CK level   DVT ppx: on IV Heparin        Code Status: Partial code (I discussed with the patient's wife who is his POA, and explained the meaning of CODE STATUS, patient would want to be partial code, OK for CPR, but no intubation per his wife). Family Communication: yes, patient's  Wife and daughter  at bed side Disposition Plan:  Anticipate discharge back to previous rehab environment Consults called:  none Admission status:  SDU/inpation       Date of Service 03/04/2017    Ivor Costa [facility] Pager [phone]  If 7PM-7AM, please contact night-coverage [email or web address] Password [email or web address] 03/04/2017, 10:30 PM

## 2017-03-04 NOTE — ED Notes (Signed)
Pt starting to make urine.  200 mL output and Pt bladder scanned after pt peed,  but was unable to read any residual.

## 2017-03-04 NOTE — ED Notes (Signed)
Placed condom cath on Pt  

## 2017-03-04 NOTE — ED Notes (Signed)
Multiple attempts to get blood from pt from this RN and [place]. Phlebotamy notified and unable to come collect blood.  This RN does not want to postpone antibiotic therapy for cultures.

## 2017-03-04 NOTE — ED Triage Notes (Addendum)
PT found slumped against wall of room around 0900. Staff reports they do not know baseline. Recent hx of UTI. He is from [employer] from back surgery. Is on Eliquis. Per EMS alert to self and situation and date and time but cannot recall event. Unknown LOC. Intermittently reports pain above left eye.

## 2017-03-05 DIAGNOSIS — S22060G Wedge compression fracture of T7-T8 vertebra, subsequent encounter for fracture with delayed healing: Secondary | ICD-10-CM

## 2017-03-05 DIAGNOSIS — N179 Acute kidney failure, unspecified: Secondary | ICD-10-CM

## 2017-03-05 LAB — APTT
APTT: 194 s — AB (ref 24–36)
APTT: 110 s — AB (ref 24–36)

## 2017-03-05 LAB — BASIC METABOLIC PANEL
Anion gap: 10 (ref 5–15)
BUN: 31 mg/dL — AB (ref 6–20)
CHLORIDE: 103 mmol/L (ref 101–111)
CO2: 24 mmol/L (ref 22–32)
Calcium: 8.1 mg/dL — ABNORMAL LOW (ref 8.9–10.3)
Creatinine, Ser: 1.36 mg/dL — ABNORMAL HIGH (ref 0.61–1.24)
GFR calc Af Amer: 59 mL/min — ABNORMAL LOW (ref >60)
GFR calc non Af Amer: 51 mL/min — ABNORMAL LOW (ref >60)
Glucose, Bld: 111 mg/dL — ABNORMAL HIGH (ref 65–99)
POTASSIUM: 4 mmol/L (ref 3.5–5.1)
SODIUM: 137 mmol/L (ref 135–145)

## 2017-03-05 LAB — GLUCOSE, CAPILLARY
GLUCOSE-CAPILLARY: 113 mg/dL — AB (ref 65–99)
Glucose-Capillary: 110 mg/dL — ABNORMAL HIGH (ref 65–99)
Glucose-Capillary: 96 mg/dL (ref 65–99)
Glucose-Capillary: 113 mg/dL — ABNORMAL HIGH (ref 65–99)

## 2017-03-05 LAB — CORTISOL-AM, BLOOD: Cortisol - AM: 56.9 ug/dL — ABNORMAL HIGH (ref 6.7–22.6)

## 2017-03-05 MED ORDER — INSULIN ASPART 100 UNIT/ML ~~LOC~~ SOLN
0.0000 [IU] | SUBCUTANEOUS | Status: DC
  Administered 2017-03-06: 1 [IU] via SUBCUTANEOUS

## 2017-03-05 MED ORDER — SODIUM CHLORIDE 0.9 % IV SOLN
INTRAVENOUS | Status: DC
  Administered 2017-03-06: 09:00:00 via INTRAVENOUS

## 2017-03-05 MED ORDER — HEPARIN (PORCINE) IN NACL 100-0.45 UNIT/ML-% IJ SOLN
1000.0000 [IU]/h | INTRAMUSCULAR | Status: DC
  Administered 2017-03-05: 1300 [IU]/h via INTRAVENOUS
  Administered 2017-03-06: 1000 [IU]/h via INTRAVENOUS
  Filled 2017-03-05 (×2): qty 250

## 2017-03-05 NOTE — Progress Notes (Signed)
[place] for apixaban PTA >> heparin  Indication: atrial fibrillation  No Known Allergies  Patient Measurements: Height: 5\' 10"  (177.8 cm) Weight: (!) 307 lb 1.6 oz (139.3 kg) IBW/kg (Calculated) : 73 Heparin Dosing Weight: 107 kg  Vital Signs: Temp: 98.1 F (36.7 C) (07/25 0820) Temp Source: Oral (07/25 0820) BP: 156/70 (07/25 0820) Pulse Rate: 73 (07/25 0820)  Labs:  Recent Labs  03/04/17 1620 03/04/17 2000 03/05/17 0520 03/05/17 0830  HGB 10.5*  --   --   --   HCT 32.7*  --   --   --   PLT 263  --   --   --   APTT  --  48* >200* 194*  HEPARINUNFRC  --  >2.20*  --   --   CREATININE 1.57*  --  1.36*  --   CKTOTAL  --  94  --   --     Estimated Creatinine Clearance: 70.1 mL/min (A) (by C-G formula based on SCr of 1.36 mg/dL (H)).  Assessment: 71 yo male on apixaban prior to arrival for history of atrial fibrillation admitted 7/24 with AMS. Unable to tolerate PO and to transition to heparin infusion. Last dose of apixaban was on 7/24 at 11:25 am. CBC stable. Following aPTT while apixaban affecting heparin level. STAT aPTT level remains elevated at 194. Per discussion with RN, holding heparin dose for one hour, and will resume at lower rate, and no bleeding noted.  Goal of Therapy:  Heparin level 0.3-0.7 units/ml aPTT 66-102 seconds Monitor platelets by anticoagulation protocol: Yes   Plan:  1. Hold heparin x1 hour 2. Decrease heparin dose from 1600 units/hr to 1300 units/hr given significantly elevated aPTT. 3. Obtain aPTT 6 hours after starting heparin infusion 4. Daily CBC and aPTT and heparin level until labs are correlating  5. Monitoring s/sx bleeding   Nida Boatman, PharmD PGY1 Acute Care Pharmacy Resident Pager: [phone] 03/05/2017, 10:07 AM

## 2017-03-05 NOTE — Progress Notes (Signed)
CRITICAL VALUE ALERT  Critical Value:  ptt >200  Date & Time Notied:  7/25 0645  Provider Notified: [place]  Orders Received/Actions taken: no orders given. Pharmacy notified of critical value.

## 2017-03-05 NOTE — Progress Notes (Signed)
Patient is more alert and able to answer RN's questions. RN asked MD for a diet order.

## 2017-03-05 NOTE — Progress Notes (Signed)
Spoke with staff member at [facility] today. MD wanted to know what type of pain meds pt was taking at facility.  Informed that pt was taking:    scheduled oxycodone 5mg  BID  scheduled tylenol 1000mg  TID   Prn oxycodone 5-10mg , last dose given 7/24 0421 10mg 

## 2017-03-05 NOTE — Progress Notes (Signed)
PROGRESS NOTE   Alex Bowman  KGM:[id number]    DOB: Sep 19, 1945    DOA: 03/04/2017  PCP: [name], MD   I have briefly reviewed patients previous medical records in [facility].  Brief Narrative:  71 year old male with PMH of HTN, HLD, DM 2, GERD, stage III chronic kidney disease, paroxysmal atrial fibrillation on Eliquis, morbid obesity, bilateral lower extremity edema, recent hospitalization 02/21/17-02/24/17 following the fall and sustained T7 compression fracture due to mechanical fall, seen by neurosurgery who recommended conservative treatment with pain control, ambulation with back brace, discharged to SNF, presented back to ED after he rolled out of his bed and fell on the floor on the morning of admission, confusion and decreased responsiveness, no report regarding LOC. Spouse reported some blood in urine recently. In the ED hypotensive with BP of 71/60 that improved to 105/96 after IV fluid hydration. CT head and C-spine negative for acute findings. CT abdomen and pelvis showed small bilateral pleural effusions and large stool burden without acute abnormalities. Admitted to stepdown unit for further evaluation and management.   Assessment & Plan:   Principal Problem:   Sepsis ([facility]) Active Problems:   Severe obesity (BMI >= 40) (HCC)   Dyslipidemia   Hypertension   Closed wedge compression fracture of T7 vertebra (HCC)   AF (paroxysmal atrial fibrillation) (HCC)   DM type 2 causing CKD stage 3 (HCC)   CKD (chronic kidney disease) stage 3, GFR 30-59 ml/min   Fall   Depression   Acute metabolic encephalopathy   1. Hypotension: Present on admission. May be multifactorial related to poor oral intake due to sedation, Lasix and medications. Responded well to IV normal saline bolus (3 L) in ED. Admitted as possible sepsis but suspect less likelihood. Chest x-ray shows atelectasis without pneumonia. CT abdomen without acute findings. Blood cultures, negative to date. Urine  microscopy, negative for UTI features. Lactate normal. Pro-calcitonin 0.54. Empirically started on IV Zosyn and vancomycin, continue for today but low threshold to stop tomorrow unless patient develops features suggestive of infection. Cortisol 57. Hypotension resolved. 2. Acute encephalopathy:? Related to medications i.e. opioids, Ambien, Zoloft. No focal neurological deficits. CT head without acute findings. Hold these medications and monitor closely. 3. Essential hypertension: Management as per problem #1. Hold PO antihypertensives for today. When necessary IV hydralazine. 4. Hyperlipidemia: Resume Lipitor when able to take by mouth safely. 5. Recent T7 closed compression fracture: Judicious use of opioid pain medications. Back brace when up and about. PT and OT evaluation. 6. Paroxysmal A. Fib: CHA2DS2-VASc Score is 3. Currently in sinus rhythm. On Eliquis at home, now switched to IV heparin while nothing by mouth due to sedation. Metoprolol switched to IV. Resume by mouth metoprolol and Eliquis when consistently alert and safe to take by mouth. 7. Type II DM: Hold metformin. Continue SSI. Good inpatient control. 8. Acute on stage III chronic kidney disease: Baseline creatinine 1.2-1.3. Resent it with creatinine of 1.57, likely related to poor oral intake, dehydration, lisinopril and Lasix. Temporarily hold his medications. Brief IV fluids. Creatinine has improved to 1.36. Follow BMP in a.m. 9. Anxiety and depression: Holding all medications due to altered mental status. 10. Fall at SNF: No details available. Apparently fell from bed. CT head and neck without acute findings. 11. Morbid obesity/Body mass index is 44.06 kg/m.' 12. Constipation: Aggressive bowel regimen. 13. Chronic anemia: Stable.   DVT prophylaxis: IV heparin currently. Code Status: Partial code: Okay for CPR but no intubation. Family Communication: None  at bedside. Disposition: Admitted to stepdown unit. DC SNF when medically  improved.   Consultants:  None   Procedures:  None  Antimicrobials:  IV Zosyn and vancomycin.    Subjective: Seen this morning. Somnolent but easily arousable. Oriented to self and place. Follows simple instructions. Denies complaints. Denies pain. As per RN, no acute findings.   ROS: Unable due to mental status changes.  Objective:  Vitals:   03/05/17 0000 03/05/17 0400 03/05/17 0440 03/05/17 0820  BP: 116/84  (!) 136/36 (!) 156/70  Pulse: 64  65 73  Resp: (!) 21  (!) 32 (!) 21  Temp:  98.1 F (36.7 C)  98.1 F (36.7 C)  TempSrc:  Oral  Oral  SpO2:    98%  Weight:      Height:        Examination:  General exam: Pleasant elderly male, moderately built and morbidly obese, lying comfortably supine in bed. Oral mucosa dry. Respiratory system: Slightly diminished breath sounds in the bases but otherwise clear to auscultation. Poor inspiratory effort. No increased work of breathing. Cardiovascular system: S1 & S2 heard, RRR. No JVD, murmurs, rubs, gallops or clicks. No pedal edema. Telemetry: Sinus rhythm. Gastrointestinal system: Abdomen is nondistended, soft and nontender. No organomegaly or masses felt. Normal bowel sounds heard. Central nervous system: Mental status as indicated above. No focal neurological deficits. Extremities: Symmetric 5 x 5 power. Skin: No rashes, lesions or ulcers Psychiatry: Judgement and insight impaired. Mood & affect cannot be assessed at this time.     Data Reviewed: I have personally reviewed following labs and imaging studies  CBC:  Recent Labs Lab 03/04/17 1620  WBC 12.1*  NEUTROABS 9.3*  HGB 10.5*  HCT 32.7*  MCV 89.3  PLT 462   Basic Metabolic Panel:  Recent Labs Lab 03/04/17 1620 03/05/17 0520  NA 135 137  K 4.5 4.0  CL 101 103  CO2 23 24  GLUCOSE 113* 111*  BUN 36* 31*  CREATININE 1.57* 1.36*  CALCIUM 8.3* 8.1*   Liver Function Tests:  Recent Labs Lab 03/04/17 1620  AST 54*  ALT 33  ALKPHOS 85    BILITOT 1.2  PROT 6.2*  ALBUMIN 2.6*   Cardiac Enzymes:  Recent Labs Lab 03/04/17 2000  CKTOTAL 94   CBG:  Recent Labs Lab 03/04/17 2329 03/05/17 0618 03/05/17 1107  GLUCAP 99 113* 110*    Recent Results (from the past 240 hour(s))  Urine Culture     Status: Abnormal   Collection Time: 02/25/17  2:56 PM  Result Value Ref Range Status   Specimen Description URINE, RANDOM  Final   Special Requests NONE  Final   Culture MULTIPLE SPECIES PRESENT, SUGGEST RECOLLECTION (A)  Final   Report Status 02/27/2017 FINAL  Final  Blood Culture (routine x 2)     Status: None (Preliminary result)   Collection Time: 03/04/17  8:00 PM  Result Value Ref Range Status   Specimen Description BLOOD RIGHT HAND  Final   Special Requests IN PEDIATRIC BOTTLE Blood Culture adequate volume  Final   Culture NO GROWTH < 24 HOURS  Final   Report Status PENDING  Incomplete         Radiology Studies: Ct Head Wo Contrast  Result Date: 03/04/2017 CLINICAL DATA:  Ct head/cspine wo, fall, confusion, best images possible due to pt movement ^154mL ISOVUE-300 IOPAMIDOL (ISOVUE-300) INJECTION 61%; Ct head, fall, confusion Ct head, fall, confusion Ct head/cspine wo, fall, confusion, best images possible due to  pt movement EXAM: CT HEAD WITHOUT CONTRAST CT CERVICAL SPINE WITHOUT CONTRAST TECHNIQUE: Multidetector CT imaging of the head and cervical spine was performed following the standard protocol without intravenous contrast. Multiplanar CT image reconstructions of the cervical spine were also generated. COMPARISON:  02/21/2017 FINDINGS: CT HEAD FINDINGS Brain: There is central and cortical atrophy. Periventricular white matter changes are consistent with small vessel disease. There is no intra or extra-axial fluid collection or mass lesion. The basilar cisterns and ventricles have a normal appearance. There is no CT evidence for acute infarction or hemorrhage. Vascular: There is atherosclerotic calcification of  the carotid siphons. Skull: Normal. Negative for fracture or focal lesion. Sinuses/Orbits: No acute finding. Other: None CT CERVICAL SPINE FINDINGS Alignment: There is loss of cervical lordosis. This may be secondary to splinting, soft tissue injury, or positioning. Skull base and vertebrae: No acute fracture. No primary bone lesion or focal pathologic process. Soft tissues and spinal canal: No prevertebral fluid or swelling. No visible canal hematoma. Disc levels: Significant disc height loss and mid cervical levels, predominantly at C4-5, C5-6, and C6-7. Upper chest: Negative. Other: Study quality is degraded by significant patient motion artifact. IMPRESSION: 1. Atrophy and small vessel disease. 2.  No evidence for acute intracranial abnormality. 3. Cervical spine exam is significantly degraded by patient motion artifact. No obvious abnormality identified . Electronically Signed   By: Nolon Nations M.D.   On: 03/04/2017 17:47   Ct Cervical Spine Wo Contrast  Result Date: 03/04/2017 CLINICAL DATA:  Ct head/cspine wo, fall, confusion, best images possible due to pt movement ^180mL ISOVUE-300 IOPAMIDOL (ISOVUE-300) INJECTION 61%; Ct head, fall, confusion Ct head, fall, confusion Ct head/cspine wo, fall, confusion, best images possible due to pt movement EXAM: CT HEAD WITHOUT CONTRAST CT CERVICAL SPINE WITHOUT CONTRAST TECHNIQUE: Multidetector CT imaging of the head and cervical spine was performed following the standard protocol without intravenous contrast. Multiplanar CT image reconstructions of the cervical spine were also generated. COMPARISON:  02/21/2017 FINDINGS: CT HEAD FINDINGS Brain: There is central and cortical atrophy. Periventricular white matter changes are consistent with small vessel disease. There is no intra or extra-axial fluid collection or mass lesion. The basilar cisterns and ventricles have a normal appearance. There is no CT evidence for acute infarction or hemorrhage. Vascular: There  is atherosclerotic calcification of the carotid siphons. Skull: Normal. Negative for fracture or focal lesion. Sinuses/Orbits: No acute finding. Other: None CT CERVICAL SPINE FINDINGS Alignment: There is loss of cervical lordosis. This may be secondary to splinting, soft tissue injury, or positioning. Skull base and vertebrae: No acute fracture. No primary bone lesion or focal pathologic process. Soft tissues and spinal canal: No prevertebral fluid or swelling. No visible canal hematoma. Disc levels: Significant disc height loss and mid cervical levels, predominantly at C4-5, C5-6, and C6-7. Upper chest: Negative. Other: Study quality is degraded by significant patient motion artifact. IMPRESSION: 1. Atrophy and small vessel disease. 2.  No evidence for acute intracranial abnormality. 3. Cervical spine exam is significantly degraded by patient motion artifact. No obvious abnormality identified . Electronically Signed   By: Nolon Nations M.D.   On: 03/04/2017 17:47   Ct Abdomen Pelvis W Contrast  Result Date: 03/04/2017 CLINICAL DATA:  Fall.  Pain.  Confusion. EXAM: CT ABDOMEN AND PELVIS WITH CONTRAST TECHNIQUE: Multidetector CT imaging of the abdomen and pelvis was performed using the standard protocol following bolus administration of intravenous contrast. CONTRAST:  80 cc ISOVUE-300 IOPAMIDOL (ISOVUE-300) INJECTION 61% COMPARISON:  CT abdomen  and pelvis on 04/23/2013 FINDINGS: Lower chest: There are small bilateral pleural effusions, right greater than left. Bibasilar atelectasis is present. No visible pneumothorax. There is significant three-vessel coronary artery disease. Hepatobiliary: Liver is homogeneous without focal lesion. Gallbladder is distended and otherwise normal in appearance. Pancreas: Unremarkable. No pancreatic ductal dilatation or surrounding inflammatory changes. Spleen: Normal in size without focal abnormality. Adrenals/Urinary Tract: Adrenal glands are normal in appearance. There is a  new 2 mm nonobstructing intrarenal calcification in the midpole region of the right kidney. There is a 1 cm cyst within the midpole region of the right kidney. No hydronephrosis or ureteral obstruction. No suspicious renal mass. Urinary bladder is normal in appearance. Stomach/Bowel: Stomach and small bowel loops are normal in appearance. There is a large amount of stool throughout mildly distended loops of colon. Scattered diverticula are present. No acute diverticulitis. The appendix is well seen and has a normal appearance. Vascular/Lymphatic: There is moderate atherosclerosis of the abdominal aorta not associated with aneurysm. Nonspecific, small femoral lymph nodes are present. No significant adenopathy. Although atherosclerotic, there is normal vascular opacification of the celiac axis, superior mesenteric artery, and inferior mesenteric artery. Normal appearance of the portal venous system and inferior vena cava. Reproductive: Normal appearance of the seminal vesicles. Prostate contains calcifications and is normal in size. Other: Small fat containing paraumbilical hernia. Musculoskeletal: Partially imaged is T7 fracture, evaluated on recent CT 02/21/2017. No interval fractures are identified. IMPRESSION: 1. Bilateral pleural effusions and bibasilar atelectasis. 2. Partially imaged T7 fracture, recently evaluated. 3. No evidence for acute injury of the abdomen or pelvis. 4. Coronary artery disease. 5.  Aortic atherosclerosis. 6. Large stool burden. Electronically Signed   By: Nolon Nations M.D.   On: 03/04/2017 18:04   Dg Chest Port 1 View  Result Date: 03/04/2017 CLINICAL DATA:  Altered mental status EXAM: PORTABLE CHEST 1 VIEW COMPARISON:  10/24/2016 FINDINGS: Low lung volumes with patchy atelectasis at the bases. Mild cardiomegaly with mild central congestion. Probable tiny right pleural effusion. Aortic atherosclerosis. Mildly nodular right perihilar opacity could relate to vascular structure. No  pneumothorax. IMPRESSION: 1. Low lung volumes with patchy atelectasis at the bases and possible tiny right effusion 2. Stable cardiomegaly with mild central congestion Electronically Signed   By: Donavan Foil M.D.   On: 03/04/2017 15:16        Scheduled Meds: . insulin aspart  0-5 Units Subcutaneous QHS  . insulin aspart  0-9 Units Subcutaneous TID WC  . metoprolol tartrate  2.5 mg Intravenous Q8H   Continuous Infusions: . sodium chloride 100 mL/hr at 03/05/17 1209  . heparin 1,300 Units/hr (03/05/17 1115)  . piperacillin-tazobactam (ZOSYN)  IV 3.375 g (03/05/17 0815)  . vancomycin       LOS: 1 day     Waylyn Tenbrink, MD, FACP, FHM. [facility] Pager [phone]  If 7PM-7AM, please contact night-coverage [email or web address] Password [facility] 03/05/2017, 12:21 PM

## 2017-03-05 NOTE — Progress Notes (Signed)
[place] for apixaban PTA >> heparin  Indication: atrial fibrillation  No Known Allergies  Patient Measurements: Height: 5\' 10"  (177.8 cm) Weight: (!) 307 lb 1.6 oz (139.3 kg) IBW/kg (Calculated) : 73 Heparin Dosing Weight: 107 kg  Vital Signs: Temp: 97.6 F (36.4 C) (07/25 2000) Temp Source: Oral (07/25 2000) BP: 133/53 (07/25 1937) Pulse Rate: 67 (07/25 2000)  Labs:  Recent Labs  03/04/17 1620  03/04/17 2000 03/05/17 0520 03/05/17 0830 03/05/17 1918  HGB 10.5*  --   --   --   --   --   HCT 32.7*  --   --   --   --   --   PLT 263  --   --   --   --   --   APTT  --   < > 48* >200* 194* 110*  HEPARINUNFRC  --   --  >2.20*  --   --   --   CREATININE 1.57*  --   --  1.36*  --   --   CKTOTAL  --   --  94  --   --   --   < > = values in this interval not displayed.  Estimated Creatinine Clearance: 70.1 mL/min (A) (by C-G formula based on SCr of 1.36 mg/dL (H)).  Assessment: 71 yo male on apixaban prior to arrival for history of atrial fibrillation admitted 7/24 with AMS. Unable to tolerate PO and to transition to heparin infusion. Last dose of apixaban was on 7/24 at 11:25 am.   Follow up aPTT still slightly high. Will decrease rate further and recheck in 6 hours. No bleeding or line issues per nurse.   Goal of Therapy:  Heparin level 0.3-0.7 units/ml aPTT 66-102 seconds Monitor platelets by anticoagulation protocol: Yes   Plan:  1. Decrease heparin to 1150 units/hr  2. aPTT 6 hours 3. Daily CBC and aPTT, and HL until labs correlative  4. Monitoring s/sx bleeding   Dierdre Harness, PharmD Clinical Pharmacist [phone] (Pager) 03/05/2017 9:25 PM

## 2017-03-05 NOTE — Progress Notes (Signed)
Orthopedic Tech Progress Note Patient Details:  Alex Bowman 1946-01-15 [id number]  Patient ID: [name], male   DOB: 04/14/1946, 71 y.o.   MRN: [id number]   Hildred Priest 03/05/2017, 1:18 PM Called in bio-tech brace order; spoke with Bella Kennedy

## 2017-03-05 NOTE — Progress Notes (Signed)
Wife here earlier today, pt has his back brace TLSO at the SNF/[facility] and she will go to get the brace today and bring it back to pt's room.

## 2017-03-06 DIAGNOSIS — E785 Hyperlipidemia, unspecified: Secondary | ICD-10-CM

## 2017-03-06 LAB — CBC
HCT: 27.1 % — ABNORMAL LOW (ref 39.0–52.0)
HEMATOCRIT: 26.8 % — AB (ref 39.0–52.0)
Hemoglobin: 8.2 g/dL — ABNORMAL LOW (ref 13.0–17.0)
Hemoglobin: 8.7 g/dL — ABNORMAL LOW (ref 13.0–17.0)
MCH: 27.3 pg (ref 26.0–34.0)
MCH: 28.9 pg (ref 26.0–34.0)
MCHC: 30.6 g/dL (ref 30.0–36.0)
MCHC: 32.1 g/dL (ref 30.0–36.0)
MCV: 89.3 fL (ref 78.0–100.0)
MCV: 90 fL (ref 78.0–100.0)
PLATELETS: 299 10*3/uL (ref 150–400)
Platelets: 305 10*3/uL (ref 150–400)
RBC: 3 MIL/uL — AB (ref 4.22–5.81)
RBC: 3.01 MIL/uL — AB (ref 4.22–5.81)
RDW: 14.8 % (ref 11.5–15.5)
RDW: 15.2 % (ref 11.5–15.5)
WBC: 11.6 10*3/uL — AB (ref 4.0–10.5)
WBC: 11.1 10*3/uL — ABNORMAL HIGH (ref 4.0–10.5)

## 2017-03-06 LAB — HEPARIN LEVEL (UNFRACTIONATED)
Heparin Unfractionated: 1.18 IU/mL — ABNORMAL HIGH (ref 0.30–0.70)
Heparin Unfractionated: >2.2 IU/mL — ABNORMAL HIGH (ref 0.30–0.70)

## 2017-03-06 LAB — GLUCOSE, CAPILLARY
GLUCOSE-CAPILLARY: 101 mg/dL — AB (ref 65–99)
GLUCOSE-CAPILLARY: 102 mg/dL — AB (ref 65–99)
GLUCOSE-CAPILLARY: 120 mg/dL — AB (ref 65–99)
GLUCOSE-CAPILLARY: 123 mg/dL — AB (ref 65–99)
GLUCOSE-CAPILLARY: 92 mg/dL (ref 65–99)
GLUCOSE-CAPILLARY: 97 mg/dL (ref 65–99)
Glucose-Capillary: 114 mg/dL — ABNORMAL HIGH (ref 65–99)

## 2017-03-06 LAB — BASIC METABOLIC PANEL
Anion gap: 11 (ref 5–15)
BUN: 24 mg/dL — AB (ref 6–20)
CHLORIDE: 104 mmol/L (ref 101–111)
CO2: 21 mmol/L — ABNORMAL LOW (ref 22–32)
Calcium: 7.7 mg/dL — ABNORMAL LOW (ref 8.9–10.3)
Creatinine, Ser: 1.3 mg/dL — ABNORMAL HIGH (ref 0.61–1.24)
GFR calc Af Amer: >60 mL/min (ref >60)
GFR calc non Af Amer: 54 mL/min — ABNORMAL LOW (ref >60)
Glucose, Bld: 100 mg/dL — ABNORMAL HIGH (ref 65–99)
POTASSIUM: 3.6 mmol/L (ref 3.5–5.1)
SODIUM: 136 mmol/L (ref 135–145)

## 2017-03-06 LAB — APTT
APTT: 87 s — AB (ref 24–36)
aPTT: 115 seconds — ABNORMAL HIGH (ref 24–36)

## 2017-03-06 MED ORDER — BISACODYL 10 MG RE SUPP
10.0000 mg | Freq: Once | RECTAL | Status: AC
  Administered 2017-03-06: 10 mg via RECTAL
  Filled 2017-03-06: qty 1

## 2017-03-06 MED ORDER — LIDOCAINE 5 % EX PTCH
1.0000 | MEDICATED_PATCH | CUTANEOUS | Status: DC
  Administered 2017-03-06: 1 via TRANSDERMAL
  Filled 2017-03-06 (×2): qty 1

## 2017-03-06 MED ORDER — POLYETHYLENE GLYCOL 3350 17 G PO PACK
17.0000 g | PACK | Freq: Two times a day (BID) | ORAL | Status: DC
  Administered 2017-03-06 – 2017-03-07 (×3): 17 g via ORAL
  Filled 2017-03-06 (×3): qty 1

## 2017-03-06 MED ORDER — ATORVASTATIN CALCIUM 40 MG PO TABS
40.0000 mg | ORAL_TABLET | Freq: Every morning | ORAL | Status: DC
  Administered 2017-03-06 – 2017-03-07 (×2): 40 mg via ORAL
  Filled 2017-03-06 (×2): qty 1

## 2017-03-06 MED ORDER — SENNA 8.6 MG PO TABS
2.0000 | ORAL_TABLET | Freq: Every day | ORAL | Status: DC
  Administered 2017-03-06: 17.2 mg via ORAL
  Filled 2017-03-06: qty 2

## 2017-03-06 MED ORDER — ADULT MULTIVITAMIN W/MINERALS CH
1.0000 | ORAL_TABLET | Freq: Every day | ORAL | Status: DC
  Administered 2017-03-06 – 2017-03-07 (×2): 1 via ORAL
  Filled 2017-03-06 (×2): qty 1

## 2017-03-06 MED ORDER — APIXABAN 5 MG PO TABS
5.0000 mg | ORAL_TABLET | Freq: Two times a day (BID) | ORAL | Status: DC
  Administered 2017-03-06 – 2017-03-07 (×2): 5 mg via ORAL
  Filled 2017-03-06 (×3): qty 1

## 2017-03-06 MED ORDER — INSULIN ASPART 100 UNIT/ML ~~LOC~~ SOLN: 0.0000 [IU] | Freq: Three times a day (TID) | SUBCUTANEOUS | Status: DC

## 2017-03-06 MED ORDER — SERTRALINE HCL 50 MG PO TABS
50.0000 mg | ORAL_TABLET | Freq: Every day | ORAL | Status: DC
  Administered 2017-03-06 – 2017-03-07 (×2): 50 mg via ORAL
  Filled 2017-03-06 (×2): qty 1

## 2017-03-06 MED ORDER — DARIFENACIN HYDROBROMIDE ER 7.5 MG PO TB24
7.5000 mg | ORAL_TABLET | Freq: Every day | ORAL | Status: DC
  Administered 2017-03-06 – 2017-03-07 (×2): 7.5 mg via ORAL
  Filled 2017-03-06 (×2): qty 1

## 2017-03-06 MED ORDER — METOPROLOL SUCCINATE ER 50 MG PO TB24
50.0000 mg | ORAL_TABLET | Freq: Two times a day (BID) | ORAL | Status: DC
  Administered 2017-03-06 – 2017-03-07 (×3): 50 mg via ORAL
  Filled 2017-03-06 (×3): qty 1

## 2017-03-06 MED ORDER — VITAMIN D 1000 UNITS PO TABS
1000.0000 [IU] | ORAL_TABLET | Freq: Every day | ORAL | Status: DC
  Administered 2017-03-06 – 2017-03-07 (×2): 1000 [IU] via ORAL
  Filled 2017-03-06 (×2): qty 1

## 2017-03-06 NOTE — Progress Notes (Signed)
[place] for apixaban PTA >> heparin  Indication: atrial fibrillation  No Known Allergies  Patient Measurements: Height: 5\' 10"  (177.8 cm) Weight: (!) 309 lb 1.4 oz (140.2 kg) IBW/kg (Calculated) : 73 Heparin Dosing Weight: 107 kg  Vital Signs: Temp: 97.7 F (36.5 C) (07/26 1159) Temp Source: Oral (07/26 1159) BP: 142/69 (07/26 1159) Pulse Rate: 66 (07/26 1159)  Labs:  Recent Labs  03/04/17 1620  03/04/17 2000 03/05/17 0520  03/05/17 1918 03/06/17 0257 03/06/17 0741 03/06/17 1340  HGB 10.5*  --   --   --   --   --  8.2* 8.7*  --   HCT 32.7*  --   --   --   --   --  26.8* 27.1*  --   PLT 263  --   --   --   --   --  305 299  --   APTT  --   < > 48* >200*  < > 110* 115*  --  87*  HEPARINUNFRC  --   --  >2.20*  --   --   --  1.18*  --  >2.20*  CREATININE 1.57*  --   --  1.36*  --   --  1.30*  --   --   CKTOTAL  --   --  94  --   --   --   --   --   --   < > = values in this interval not displayed.  Estimated Creatinine Clearance: 73.6 mL/min (A) (by C-G formula based on SCr of 1.3 mg/dL (H)).  Assessment: 71 yo male on apixaban prior to arrival for history of atrial fibrillation admitted 7/24 with AMS. Pt on heparin infusion because unable to tolerate PO. Last dose of apixaban was on 7/24 at 11:25 am.   APTT 87 and heparin level pending. Expect heparin level to still be elevated. Heparin rate was continued at 1000 units/hr yesterday. Hgb stable. No bleeding or infusion issues.   Goal of Therapy:  Heparin level 0.3-0.7 units/ml aPTT 66-102 seconds Monitor platelets by anticoagulation protocol: Yes   Plan:  1. Continue heparin 1000 units/hr  2. aPTT 6 hours to confirm 3. Daily CBC and aPTT, and heparin level until labs correlative  4. Monitoring s/sx bleeding  Nida Boatman, PharmD PGY1 Acute Care Pharmacy Resident Pager: [phone] 03/06/2017 3:32 PM  ADDENDUM Pharmacy consulted to transition back to PTA apixaban 5mg   BID for atrial fibrillation. Dose appropriate. No bleeding documented. Communicated plan with RN.   Plan:  Discontinue heparin when first dose of apixaban 5mg  BID given.  F/u CBC w/ differential, Scr, LFTs S/sx bleeding  Nida Boatman, PharmD PGY1 Acute Care Pharmacy Resident Pager: [phone]

## 2017-03-06 NOTE — Progress Notes (Signed)
[place] for apixaban PTA >> heparin  Indication: atrial fibrillation  No Known Allergies  Patient Measurements: Height: 5\' 10"  (177.8 cm) Weight: (!) 307 lb 1.6 oz (139.3 kg) IBW/kg (Calculated) : 73 Heparin Dosing Weight: 107 kg  Vital Signs: Temp: 98.7 F (37.1 C) (07/26 0448) Temp Source: Oral (07/26 0448) BP: 142/83 (07/26 0448) Pulse Rate: 65 (07/26 0448)  Labs:  Recent Labs  03/04/17 1620  03/04/17 2000 03/05/17 0520 03/05/17 0830 03/05/17 1918 03/06/17 0257  HGB 10.5*  --   --   --   --   --  8.2*  HCT 32.7*  --   --   --   --   --  26.8*  PLT 263  --   --   --   --   --  305  APTT  --   < > 48* >200* 194* 110* 115*  HEPARINUNFRC  --   --  >2.20*  --   --   --  1.18*  CREATININE 1.57*  --   --  1.36*  --   --  1.30*  CKTOTAL  --   --  94  --   --   --   --   < > = values in this interval not displayed.  Estimated Creatinine Clearance: 73.3 mL/min (A) (by C-G formula based on SCr of 1.3 mg/dL (H)).  Assessment: 71 yo male on apixaban prior to arrival for history of atrial fibrillation admitted 7/24 with AMS. Unable to tolerate PO and to transition to heparin infusion. Last dose of apixaban was on 7/24 at 11:25 am.   APTT supratherapeutic 115 after rate decrease yesterday - heparin is infusing in right arm and labs were collected from his left; HgB down 10.5>8.2; Rn reports no bleeding or infusion issues  Goal of Therapy:  Heparin level 0.3-0.7 units/ml aPTT 66-102 seconds Monitor platelets by anticoagulation protocol: Yes   Plan:  1. Decrease heparin to 1000 units/hr   2. aPTT 6 hours 3. Daily CBC and aPTT, and HL until labs correlative  4. Monitoring s/sx bleeding  Georga Bora, PharmD Clinical Pharmacist 03/06/2017 5:29 AM

## 2017-03-06 NOTE — Progress Notes (Signed)
No bleeding noted on patient's back , will continue to monitor.

## 2017-03-06 NOTE — Progress Notes (Signed)
[place] for apixaban PTA >> heparin  Indication: atrial fibrillation  No Known Allergies  Patient Measurements: Height: 5\' 10"  (177.8 cm) Weight: (!) 309 lb 1.4 oz (140.2 kg) IBW/kg (Calculated) : 73 Heparin Dosing Weight: 107 kg  Vital Signs: Temp: 98.7 F (37.1 C) (07/26 0448) Temp Source: Oral (07/26 0448) BP: 142/83 (07/26 0448) Pulse Rate: 65 (07/26 0448)  Labs:  Recent Labs  03/04/17 1620  03/04/17 2000 03/05/17 0520 03/05/17 0830 03/05/17 1918 03/06/17 0257  HGB 10.5*  --   --   --   --   --  8.2*  HCT 32.7*  --   --   --   --   --  26.8*  PLT 263  --   --   --   --   --  305  APTT  --   < > 48* >200* 194* 110* 115*  HEPARINUNFRC  --   --  >2.20*  --   --   --  1.18*  CREATININE 1.57*  --   --  1.36*  --   --  1.30*  CKTOTAL  --   --  94  --   --   --   --   < > = values in this interval not displayed.  Estimated Creatinine Clearance: 73.6 mL/min (A) (by C-G formula based on SCr of 1.3 mg/dL (H)).  Assessment: 71 yo male on apixaban prior to arrival for history of atrial fibrillation admitted 7/24 with AMS. Pt on heparin infusion because unable to tolerate PO. Last dose of apixaban was on 7/24 at 11:25 am.   APTT 87 and heparin level pending. Expect heparin level to still be elevated. Heparin rate was continued at 1000 units/hr yesterday. Hgb stable. No bleeding or infusion issues.   Goal of Therapy:  Heparin level 0.3-0.7 units/ml aPTT 66-102 seconds Monitor platelets by anticoagulation protocol: Yes   Plan:  1. Continue heparin 1000 units/hr  2. aPTT 6 hours to confirm 3. Daily CBC and aPTT, and heparin level until labs correlative  4. Monitoring s/sx bleeding  Nida Boatman, PharmD PGY1 Acute Care Pharmacy Resident Pager: [phone] 03/06/2017 2:46 PM

## 2017-03-06 NOTE — Progress Notes (Signed)
PROGRESS NOTE   Alex Bowman  FTD:[id number]    DOB: 1946-04-30    DOA: 03/04/2017  PCP: [name], MD   I have briefly reviewed patients previous medical records in [facility].  Brief Narrative:  71 year old male with PMH of HTN, HLD, DM 2, GERD, stage III chronic kidney disease, paroxysmal atrial fibrillation on Eliquis, morbid obesity, bilateral lower extremity edema, recent hospitalization 02/21/17-02/24/17 following the fall and sustained T7 compression fracture due to mechanical fall, seen by neurosurgery who recommended conservative treatment with pain control, ambulation with back brace, discharged to SNF, presented back to ED after he rolled out of his bed and fell on the floor on the morning of admission, confusion and decreased responsiveness, no report regarding LOC. Spouse reported some blood in urine recently. In the ED hypotensive with BP of 71/60 that improved to 105/96 after IV fluid hydration. CT head and C-spine negative for acute findings. CT abdomen and pelvis showed small bilateral pleural effusions and large stool burden without acute abnormalities. Admitted to stepdown unit for further evaluation and management.   Assessment & Plan:   Principal Problem:   Sepsis ([place]) Active Problems:   Severe obesity (BMI >= 40) (HCC)   Dyslipidemia   Hypertension   Closed wedge compression fracture of T7 vertebra (HCC)   AF (paroxysmal atrial fibrillation) (HCC)   DM type 2 causing CKD stage 3 (HCC)   CKD (chronic kidney disease) stage 3, GFR 30-59 ml/min   Fall   Depression   Acute metabolic encephalopathy   1. Hypotension: Present on admission. May be multifactorial related to poor oral intake due to sedation, Lasix and medications. Responded well to IV normal saline bolus (3 L) in ED. Admitted as possible sepsis but suspect less likelihood. Chest x-ray shows atelectasis without pneumonia. CT abdomen without acute findings. Blood cultures, negative to date. Urine  microscopy, negative for UTI features. Lactate normal. Pro-calcitonin 0.54. Cortisol 57. Hypotension resolved. Low index of suspicion for infectious etiology and hence discontinued empirically started IV vancomycin and Zosyn. 2. Acute encephalopathy:? Related to medications i.e. opioids, Ambien, Zoloft. No focal neurological deficits. CT head without acute findings. Held these medications and monitor closely. Seems to have resolved and mental status probably back to baseline. 3. Essential hypertension: Management as per problem #1. Holding PO antihypertensives for today. When necessary IV hydralazine. Controlled. 4. Hyperlipidemia: Resume Lipitor. 5. Recent T7 closed compression fracture: Judicious use of opioid pain medications. Back brace when up and about. PT and OT evaluation>  Recommend returning to SNF.[name] 6. Paroxysmal A. Fib: CHA2DS2-VASc Score is 3. Currently in sinus rhythm. On Eliquis at home, was switched to IV heparin while nothing by mouth due to sedation. Metoprolol switched to IV. Resume by mouth metoprolol and Eliquis now that he's fully awake and alert.. 7. Type II DM: Hold metformin. Continue SSI. Good inpatient control. 8. Acute on stage III chronic kidney disease: Baseline creatinine 1.2-1.3. Resent it with creatinine of 1.57, likely related to poor oral intake, dehydration, lisinopril and Lasix. Temporarily hold his medications. Brief IV fluids. Creatinine has improved to 1.36.  Creatinine has plateaued at 1.3. DC IV fluids. 9. Anxiety and depression: Holding all medications due to altered mental status. Resume medications as needed. 10. Fall at SNF: No details available. Apparently fell from bed. CT head and neck without acute findings. 11. Morbid obesity/Body mass index is 44.35 kg/m.' 12. Constipation: Aggressive bowel regimen. 13. Chronic anemia:  Hemoglobin was 8.2 this morning. No bleeding reported. No melena.  Repeated hemoglobin 8.7. Baseline may be in the 9-10  range.  Possibly hemodilution. Follow CBC in a.m.   DVT prophylaxis: IV heparin currently. Code Status: Partial code: Okay for CPR but no intubation. Family Communication: None at bedside. Disposition: Admitted to stepdown unit. DC SNF possibly 7/27.   Consultants:  None   Procedures:  None  Antimicrobials:  IV Zosyn and vancomycin> DC'ed   Subjective:  Seen this morning. Looks much improved. Sitting up in bed eating breakfast. Alert and oriented. Denies dyspnea, cough, chest pain. Mild intermittent back pain on movement.  ROS:  No dizziness, lightheadedness. Denies dysuria.  Objective:  Vitals:   03/06/17 0448 03/06/17 0500 03/06/17 0800 03/06/17 1159  BP: (!) 142/83  136/73 (!) 142/69  Pulse: 65  64 66  Resp: 18  18 20   Temp: 98.7 F (37.1 C)  97.9 F (36.6 C) 97.7 F (36.5 C)  TempSrc: Oral  Oral Oral  SpO2: 99%  96% 91%  Weight:  (!) 140.2 kg (309 lb 1.4 oz)    Height:        Examination:  General exam: Pleasant elderly male, moderately built and morbidly obese, sitting up comfortably in bed eating breakfast this morning. Looks much better compared to yesterday.[name] Respiratory system:  Clear to auscultation. No increased work of breathing. Cardiovascular system: S1 & S2 heard, RRR. No JVD, murmurs, rubs, gallops or clicks.  Trace bilateral ankle edema.. Telemetry: Sinus rhythm. Gastrointestinal system: Abdomen is nondistended, soft and nontender. No organomegaly or masses felt. Normal bowel sounds heard. Stable without change. Central nervous system:  Alert and oriented 3. No focal neurological deficits. Extremities: Symmetric 5 x 5 power. Skin: No rashes, lesions or ulcers Psychiatry: Judgement and insight intact. Mood & affect pleasant and appropriate.    Data Reviewed: I have personally reviewed following labs and imaging studies  CBC:  Recent Labs Lab 03/04/17 1620 03/06/17 0257 03/06/17 0741  WBC 12.1* 11.6* 11.1*  NEUTROABS 9.3*  --   --   HGB 10.5*  8.2* 8.7*  HCT 32.7* 26.8* 27.1*  MCV 89.3 89.3 90.0  PLT 263 305 854   Basic Metabolic Panel:  Recent Labs Lab 03/04/17 1620 03/05/17 0520 03/06/17 0257  NA 135 137 136  K 4.5 4.0 3.6  CL 101 103 104  CO2 23 24 21*  GLUCOSE 113* 111* 100*  BUN 36* 31* 24*  CREATININE 1.57* 1.36* 1.30*  CALCIUM 8.3* 8.1* 7.7*   Liver Function Tests:  Recent Labs Lab 03/04/17 1620  AST 54*  ALT 33  ALKPHOS 85  BILITOT 1.2  PROT 6.2*  ALBUMIN 2.6*   Cardiac Enzymes:  Recent Labs Lab 03/04/17 2000  CKTOTAL 94   CBG:  Recent Labs Lab 03/05/17 2056 03/06/17 0052 03/06/17 0439 03/06/17 0813 03/06/17 1135  GLUCAP 113* 97 101* 92 123*    Recent Results (from the past 240 hour(s))  Urine Culture     Status: Abnormal   Collection Time: 02/25/17  2:56 PM  Result Value Ref Range Status   Specimen Description URINE, RANDOM  Final   Special Requests NONE  Final   Culture MULTIPLE SPECIES PRESENT, SUGGEST RECOLLECTION (A)  Final   Report Status 02/27/2017 FINAL  Final  Blood Culture (routine x 2)     Status: None (Preliminary result)   Collection Time: 03/04/17  8:00 PM  Result Value Ref Range Status   Specimen Description BLOOD RIGHT HAND  Final   Special Requests IN PEDIATRIC BOTTLE Blood Culture adequate volume  Final   Culture NO GROWTH 2 DAYS  Final   Report Status PENDING  Incomplete  Blood Culture (routine x 2)     Status: None (Preliminary result)   Collection Time: 03/04/17 11:52 PM  Result Value Ref Range Status   Specimen Description BLOOD RIGHT FOREARM  Final   Special Requests IN PEDIATRIC BOTTLE Blood Culture adequate volume  Final   Culture NO GROWTH 1 DAY  Final   Report Status PENDING  Incomplete         Radiology Studies: Ct Head Wo Contrast  Result Date: 03/04/2017 CLINICAL DATA:  Ct head/cspine wo, fall, confusion, best images possible due to pt movement ^122mL ISOVUE-300 IOPAMIDOL (ISOVUE-300) INJECTION 61%; Ct head, fall, confusion Ct head,  fall, confusion Ct head/cspine wo, fall, confusion, best images possible due to pt movement EXAM: CT HEAD WITHOUT CONTRAST CT CERVICAL SPINE WITHOUT CONTRAST TECHNIQUE: Multidetector CT imaging of the head and cervical spine was performed following the standard protocol without intravenous contrast. Multiplanar CT image reconstructions of the cervical spine were also generated. COMPARISON:  02/21/2017 FINDINGS: CT HEAD FINDINGS Brain: There is central and cortical atrophy. Periventricular white matter changes are consistent with small vessel disease. There is no intra or extra-axial fluid collection or mass lesion. The basilar cisterns and ventricles have a normal appearance. There is no CT evidence for acute infarction or hemorrhage. Vascular: There is atherosclerotic calcification of the carotid siphons. Skull: Normal. Negative for fracture or focal lesion. Sinuses/Orbits: No acute finding. Other: None CT CERVICAL SPINE FINDINGS Alignment: There is loss of cervical lordosis. This may be secondary to splinting, soft tissue injury, or positioning. Skull base and vertebrae: No acute fracture. No primary bone lesion or focal pathologic process. Soft tissues and spinal canal: No prevertebral fluid or swelling. No visible canal hematoma. Disc levels: Significant disc height loss and mid cervical levels, predominantly at C4-5, C5-6, and C6-7. Upper chest: Negative. Other: Study quality is degraded by significant patient motion artifact. IMPRESSION: 1. Atrophy and small vessel disease. 2.  No evidence for acute intracranial abnormality. 3. Cervical spine exam is significantly degraded by patient motion artifact. No obvious abnormality identified . Electronically Signed   By: Nolon Nations M.D.   On: 03/04/2017 17:47   Ct Cervical Spine Wo Contrast  Result Date: 03/04/2017 CLINICAL DATA:  Ct head/cspine wo, fall, confusion, best images possible due to pt movement ^147mL ISOVUE-300 IOPAMIDOL (ISOVUE-300) INJECTION  61%; Ct head, fall, confusion Ct head, fall, confusion Ct head/cspine wo, fall, confusion, best images possible due to pt movement EXAM: CT HEAD WITHOUT CONTRAST CT CERVICAL SPINE WITHOUT CONTRAST TECHNIQUE: Multidetector CT imaging of the head and cervical spine was performed following the standard protocol without intravenous contrast. Multiplanar CT image reconstructions of the cervical spine were also generated. COMPARISON:  02/21/2017 FINDINGS: CT HEAD FINDINGS Brain: There is central and cortical atrophy. Periventricular white matter changes are consistent with small vessel disease. There is no intra or extra-axial fluid collection or mass lesion. The basilar cisterns and ventricles have a normal appearance. There is no CT evidence for acute infarction or hemorrhage. Vascular: There is atherosclerotic calcification of the carotid siphons. Skull: Normal. Negative for fracture or focal lesion. Sinuses/Orbits: No acute finding. Other: None CT CERVICAL SPINE FINDINGS Alignment: There is loss of cervical lordosis. This may be secondary to splinting, soft tissue injury, or positioning. Skull base and vertebrae: No acute fracture. No primary bone lesion or focal pathologic process. Soft tissues and spinal canal: No prevertebral fluid or  swelling. No visible canal hematoma. Disc levels: Significant disc height loss and mid cervical levels, predominantly at C4-5, C5-6, and C6-7. Upper chest: Negative. Other: Study quality is degraded by significant patient motion artifact. IMPRESSION: 1. Atrophy and small vessel disease. 2.  No evidence for acute intracranial abnormality. 3. Cervical spine exam is significantly degraded by patient motion artifact. No obvious abnormality identified . Electronically Signed   By: Nolon Nations M.D.   On: 03/04/2017 17:47   Ct Abdomen Pelvis W Contrast  Result Date: 03/04/2017 CLINICAL DATA:  Fall.  Pain.  Confusion. EXAM: CT ABDOMEN AND PELVIS WITH CONTRAST TECHNIQUE: Multidetector  CT imaging of the abdomen and pelvis was performed using the standard protocol following bolus administration of intravenous contrast. CONTRAST:  80 cc ISOVUE-300 IOPAMIDOL (ISOVUE-300) INJECTION 61% COMPARISON:  CT abdomen and pelvis on 04/23/2013 FINDINGS: Lower chest: There are small bilateral pleural effusions, right greater than left. Bibasilar atelectasis is present. No visible pneumothorax. There is significant three-vessel coronary artery disease. Hepatobiliary: Liver is homogeneous without focal lesion. Gallbladder is distended and otherwise normal in appearance. Pancreas: Unremarkable. No pancreatic ductal dilatation or surrounding inflammatory changes. Spleen: Normal in size without focal abnormality. Adrenals/Urinary Tract: Adrenal glands are normal in appearance. There is a new 2 mm nonobstructing intrarenal calcification in the midpole region of the right kidney. There is a 1 cm cyst within the midpole region of the right kidney. No hydronephrosis or ureteral obstruction. No suspicious renal mass. Urinary bladder is normal in appearance. Stomach/Bowel: Stomach and small bowel loops are normal in appearance. There is a large amount of stool throughout mildly distended loops of colon. Scattered diverticula are present. No acute diverticulitis. The appendix is well seen and has a normal appearance. Vascular/Lymphatic: There is moderate atherosclerosis of the abdominal aorta not associated with aneurysm. Nonspecific, small femoral lymph nodes are present. No significant adenopathy. Although atherosclerotic, there is normal vascular opacification of the celiac axis, superior mesenteric artery, and inferior mesenteric artery. Normal appearance of the portal venous system and inferior vena cava. Reproductive: Normal appearance of the seminal vesicles. Prostate contains calcifications and is normal in size. Other: Small fat containing paraumbilical hernia. Musculoskeletal: Partially imaged is T7 fracture,  evaluated on recent CT 02/21/2017. No interval fractures are identified. IMPRESSION: 1. Bilateral pleural effusions and bibasilar atelectasis. 2. Partially imaged T7 fracture, recently evaluated. 3. No evidence for acute injury of the abdomen or pelvis. 4. Coronary artery disease. 5.  Aortic atherosclerosis. 6. Large stool burden. Electronically Signed   By: Nolon Nations M.D.   On: 03/04/2017 18:04   Dg Chest Port 1 View  Result Date: 03/04/2017 CLINICAL DATA:  Altered mental status EXAM: PORTABLE CHEST 1 VIEW COMPARISON:  10/24/2016 FINDINGS: Low lung volumes with patchy atelectasis at the bases. Mild cardiomegaly with mild central congestion. Probable tiny right pleural effusion. Aortic atherosclerosis. Mildly nodular right perihilar opacity could relate to vascular structure. No pneumothorax. IMPRESSION: 1. Low lung volumes with patchy atelectasis at the bases and possible tiny right effusion 2. Stable cardiomegaly with mild central congestion Electronically Signed   By: Donavan Foil M.D.   On: 03/04/2017 15:16        Scheduled Meds: . insulin aspart  0-9 Units Subcutaneous Q4H  . metoprolol tartrate  2.5 mg Intravenous Q8H   Continuous Infusions: . heparin 1,000 Units/hr (03/06/17 1428)  . piperacillin-tazobactam (ZOSYN)  IV 3.375 g (03/06/17 0830)  . vancomycin Stopped (03/05/17 2006)     LOS: 2 days     Zelene Barga,  MD, FACP, FHM. [facility] Pager [phone]  If 7PM-7AM, please contact night-coverage [email or web address] Password [facility] 03/06/2017, 3:04 PM

## 2017-03-06 NOTE — Clinical Social Work Note (Signed)
Clinical Social Work Assessment  Patient Details  Name: Alex Bowman MRN: [id number] Date of Birth: 1946-06-24  Date of referral:  03/06/17               Reason for consult:  Discharge Planning                Permission sought to share information with:  Facility Sport and exercise psychologist, Family Supports Permission granted to share information::  Yes, Verbal Permission Granted  Name::     [name]  Agency::  [facility]  Relationship::  Spouse  Contact Information:  [phone]  Housing/Transportation Living arrangements for the past 2 months:  [place], [place] of Information:  Patient, Spouse Patient Interpreter Needed:  None Criminal Activity/Legal Involvement Pertinent to Current Situation/Hospitalization:  No - Comment as needed Significant Relationships:  Spouse Lives with:  Spouse Do you feel safe going back to the place where you live?  Yes Need for family participation in patient care:  No (Coment)  Care giving concerns:  CSW received consult for possible return to SNF placement at time of discharge. CSW spoke with patient and his wife at bedside regarding recommendation of SNF placement at time of discharge. Patient came to hospital from [facility] and would like to return. Patient reported that patient's spouse is currently unable to care for patient at their home given patient's current physical needs and fall risk. Patient expressed understanding of PT recommendation and is agreeable to SNF placement at time of discharge. CSW to continue to follow and assist with discharge planning needs.   Social Worker assessment / plan:  CSW spoke with patient concerning possibility of returning to rehab at [place] before returning home.  Employment status:  Retired Nurse, adult PT Recommendations:  [place] / Referral to community resources:  [place]  Patient/Family's Response to care:   Patient recognizes need for rehab before returning home and is agreeable to returning to [place]. Patient's wife also stated that she was going to apply for Medicaid for patient.  Patient/Family's Understanding of and Emotional Response to Diagnosis, Current Treatment, and Prognosis:  Patient/family is realistic regarding therapy needs and expressed being hopeful for SNF placement. Patient expressed understanding of CSW role and discharge process and medical condition. No questions/concerns about plan or treatment.    Emotional Assessment Appearance:  Appears stated age Attitude/Demeanor/Rapport:  Other (Appropriate) Affect (typically observed):  Accepting, Appropriate, Pleasant Orientation:  Oriented to Self, Oriented to Place, Oriented to Situation Alcohol / Substance use:  Not Applicable Psych involvement (Current and /or in the community):  No (Comment)  Discharge Needs  Concerns to be addressed:  Care Coordination Readmission within the last 30 days:  Yes Current discharge risk:  None Barriers to Discharge:  Continued Medical Work up   [employer], [place] 03/06/2017, 11:44 AM

## 2017-03-06 NOTE — NC FL2 (Signed)
[place] LEVEL OF CARE SCREENING TOOL     IDENTIFICATION  Patient Name: Alex Bowman Birthdate: 07-30-1946 Sex: male Admission Date (Current Location): 03/04/2017  [facility] and [place] Number:  Herbalist and Address:  [facility], [place] [address] [place], [place] [address]      Provider Number: [id number]  Attending Physician Name and Address:  Modena Jansky, MD  Relative Name and Phone Number:       Current Level of Care: Hospital Recommended Level of Care: [place] Prior Approval Number:    Date Approved/Denied:   PASRR Number: [id number]  Discharge Plan: SNF    Current Diagnoses: Patient Active Problem List   Diagnosis Date Noted  . Sepsis ([place]) 03/04/2017  . Fall 03/04/2017  . Depression 03/04/2017  . Acute metabolic encephalopathy 38/75/6433  . Hypotension   . AF (paroxysmal atrial fibrillation) ([place]) 02/24/2017  . DM type 2 causing CKD stage 3 ([place]) 02/24/2017  . CKD (chronic kidney disease) stage 3, GFR 30-59 ml/min 02/24/2017  . Closed wedge compression fracture of T7 vertebra ([place]) 02/22/2017  . Anemia 02/22/2017  . Severe obesity (BMI >= 40) ([place]) 03/13/2013  . Dyslipidemia   . Hypertension   . Bilateral lower extremity edema   . Metabolic syndrome 29/51/8841    Orientation RESPIRATION BLADDER Height & Weight     Self, Situation, Place  Normal Incontinent, External catheter Weight: (!) 140.2 kg (309 lb 1.4 oz) Height:  5\' 10"  (177.8 cm)  BEHAVIORAL SYMPTOMS/MOOD NEUROLOGICAL BOWEL NUTRITION STATUS      Continent Diet (Please see DC Summary)  AMBULATORY STATUS COMMUNICATION OF NEEDS Skin   Limited Assist Verbally Normal                       Personal Care Assistance Level of Assistance  Bathing, Dressing Bathing Assistance: Maximum assistance   Dressing Assistance: Limited assistance     Functional Limitations Info             SPECIAL CARE FACTORS FREQUENCY   PT (By licensed PT)     PT Frequency: 5x/week              Contractures Contractures Info: Not present    Additional Factors Info  Code Status, Allergies, Insulin Sliding Scale Code Status Info: Partial Allergies Info: NKA   Insulin Sliding Scale Info: Every 4 hours       Current Medications (03/06/2017):  This is the current hospital active medication list Current Facility-Administered Medications  Medication Dose Route Frequency Provider Last Rate Last Dose  . acetaminophen (TYLENOL) tablet 650 mg  650 mg Oral Q6H PRN Ivor Costa, MD   650 mg at 03/06/17 6606   Or  . acetaminophen (TYLENOL) suppository 650 mg  650 mg Rectal Q6H PRN Ivor Costa, MD      . heparin ADULT infusion 100 units/mL (25000 units/265mL sodium chloride 0.45%)  1,000 Units/hr Intravenous Continuous Dawayne Cirri, RPH 10 mL/hr at 03/06/17 0551 1,000 Units/hr at 03/06/17 0551  . hydrALAZINE (APRESOLINE) injection 5 mg  5 mg Intravenous Q2H PRN Ivor Costa, MD      . insulin aspart (novoLOG) injection 0-9 Units  0-9 Units Subcutaneous Q4H Modena Jansky, MD   1 Units at 03/06/17 1155  . metoprolol tartrate (LOPRESSOR) injection 2.5 mg  2.5 mg Intravenous Cleophas Dunker, MD   2.5 mg at 03/06/17 0555  . piperacillin-tazobactam (ZOSYN) IVPB 3.375 g  3.375 g Intravenous Cleophas Dunker, MD 12.5 mL/hr at 03/06/17 0830 3.375 g at 03/06/17 0830  . vancomycin (VANCOCIN) 1,500 mg in sodium chloride 0.9 % 500 mL IVPB  1,500 mg Intravenous Q24H Ivor Costa, MD   Stopped at 03/05/17 2006     Discharge Medications: Please see discharge summary for a list of discharge medications.  Relevant Imaging Results:  Relevant Lab Results:   Additional Information [id number]  Benard Halsted, LCSWA

## 2017-03-06 NOTE — Care Management Note (Signed)
Case Management Note Marvetta Gibbons RN, BSN Unit 4E-Case Manager [phone]  Patient Details  Name: DAYMION NAZAIRE MRN: [id number] Date of Birth: 03-Aug-1946  Subjective/Objective:   Pt admitted with sepsis and AMS                 Action/Plan: PTA pt from [place] consulted for return to SNF when medically stable.   Expected Discharge Date:                  Expected Discharge Plan:  [place]  In-House Referral:  Clinical Social Work  Discharge planning Services  CM Consult  Post Acute Care Choice:  NA Choice offered to:  NA  DME Arranged:    DME Agency:     HH Arranged:  NA HH Agency:  NA  Status of Service:  Completed, signed off  If discussed at [place] of Stay Meetings, dates discussed:    Discharge Disposition: skilled facility   Additional Comments:  Dawayne Patricia, RN 03/06/2017, 4:42 PM

## 2017-03-06 NOTE — Evaluation (Signed)
Physical Therapy Evaluation Patient Details Name: Alex Bowman MRN: [id number] DOB: 03/12/1946 Today's Date: 03/06/2017   History of Present Illness  71 yo male s/p fall with T7 fracture, managed conservatively. PMHx: obesity, HTN, GERD, arthritis, DM  Clinical Impression  Pt A&O x 3 (disoriented to situation) and states he is not sure why he is in the hospital. Explained to pt he had a fall out of bed at [facility]. Pt agreeable to mobilize with PT; however not able to get pt to recliner chair as TLSO was found in pieces. Called [employer] and they will be coming up to put the brace together. Pt requires a great deal of physical assist for bed mobility and transfers; has declined since last admission ~10 days ago. Pt able to stand and sidestep with RW with max VCs and put back to bed. Pt presents with deficits listed in PT problem list below and will benefit from continued acute therapy for mobilization, education on safe transfers with DME and education on brace management, and strengthening for safe d/c. Plan to go back to [facility] is appropriate d/t pt's physical assist needs.     Follow Up Recommendations SNF;Supervision/Assistance - 24 hour    Equipment Recommendations  None recommended by PT    Recommendations for Other Services       Precautions / Restrictions Precautions Precautions: Fall;Back Required Braces or Orthoses: Spinal Brace Spinal Brace: Thoracolumbosacral orthotic Restrictions Weight Bearing Restrictions: No      Mobility  Bed Mobility Overal bed mobility: Needs Assistance Bed Mobility: Rolling;Sidelying to Sit;Sit to Sidelying Rolling: Max assist;+2 for physical assistance Sidelying to sit: Max assist;+2 for physical assistance     Sit to sidelying: Max assist;+2 for physical assistance General bed mobility comments: Max A +2 for rise and bringing legs onto and off bed. Max VCs for sequencing and hand placement   Transfers Overall transfer level:  Needs assistance   Transfers: Sit to/from Stand Sit to Stand: Max assist;+2 physical assistance         General transfer comment: Max +2 for rise and max VCs for hand placement. Side stepped at EOB to get to top of bed to lay down. Max VCs for sequencing.   Ambulation/Gait                Stairs            Wheelchair Mobility    Modified Rankin (Stroke Patients Only)       Balance Overall balance assessment: Needs assistance Sitting-balance support: Bilateral upper extremity supported;Feet unsupported Sitting balance-Leahy Scale: Poor Sitting balance - Comments: Pt able to sit EOB without physical assistance; however requires use of Bil UEs for support    Standing balance support: Bilateral upper extremity supported;During functional activity Standing balance-Leahy Scale: Poor Standing balance comment: Heavy reliance on RW              High level balance activites: Side stepping High Level Balance Comments: Pt requiring max VCs for sequencing with sidestepping at EOB. Remains reliant on RW for support             Pertinent Vitals/Pain Pain Assessment: 0-10 Pain Score: 6  Pain Location: back Pain Descriptors / Indicators: Discomfort Pain Intervention(s): Limited activity within patient's tolerance;Monitored during session;Repositioned    Home Living Family/patient expects to be discharged to:: Skilled nursing facility Living Arrangements: Spouse/significant other   Type of Home: House Home Access: Ramped entrance     Home Layout: One level Home  Equipment: Cane - single point;Walker - 2 wheels;Shower seat Additional Comments: pt sleeps in lift chair    Prior Function Level of Independence: Needs assistance   Gait / Transfers Assistance Needed: walks in house with cane and furniture walks. requires RW in community. reports wife is more mobile that he is and she just got out of rehab  ADL's / Homemaking Assistance Needed: can not complete  shower transfer because he reports its not safe        Hand Dominance        Extremity/Trunk Assessment   Upper Extremity Assessment Upper Extremity Assessment: Generalized weakness    Lower Extremity Assessment Lower Extremity Assessment: Generalized weakness    Cervical / Trunk Assessment Cervical / Trunk Assessment: Kyphotic  Communication   Communication: HOH  Cognition Arousal/Alertness: Awake/alert Behavior During Therapy: WFL for tasks assessed/performed Overall Cognitive Status: Impaired/Different from baseline Area of Impairment: Orientation;Following commands;Problem solving;Memory                 Orientation Level: Disoriented to;Situation   Memory: Decreased short-term memory Following Commands: Follows one step commands with increased time     Problem Solving: Slow processing;Difficulty sequencing;Requires verbal cues        General Comments      Exercises     Assessment/Plan    PT Assessment Patient needs continued PT services  PT Problem List Decreased strength;Decreased mobility;Decreased safety awareness;Decreased activity tolerance;Decreased balance;Decreased knowledge of use of DME;Decreased knowledge of precautions;Obesity       PT Treatment Interventions DME instruction;Gait training;Functional mobility training;Therapeutic activities;Therapeutic exercise;Balance training;Patient/family education    PT Goals (Current goals can be found in the Care Plan section)  Acute Rehab PT Goals Patient Stated Goal: go home PT Goal Formulation: With patient Time For Goal Achievement: 03/20/17 Potential to Achieve Goals: Fair    Frequency Min 2X/week   Barriers to discharge Decreased caregiver support      Co-evaluation               AM-PAC PT "6 Clicks" Daily Activity  Outcome Measure Difficulty turning over in bed (including adjusting bedclothes, sheets and blankets)?: Total Difficulty moving from lying on back to sitting on  the side of the bed? : Total Difficulty sitting down on and standing up from a chair with arms (e.g., wheelchair, bedside commode, etc,.)?: Total Help needed moving to and from a bed to chair (including a wheelchair)?: A Lot Help needed walking in hospital room?: A Lot Help needed climbing 3-5 steps with a railing? : Total 6 Click Score: 8    End of Session   Activity Tolerance: Patient tolerated treatment well Patient left: in bed;with bed alarm set Nurse Communication: Mobility status;Precautions PT Visit Diagnosis: Muscle weakness (generalized) (M62.81);Other abnormalities of gait and mobility (R26.89);Difficulty in walking, not elsewhere classified (R26.2);History of falling (Z91.81)    Time: 8250-5397 PT Time Calculation (min) (ACUTE ONLY): 30 min   Charges:   PT Evaluation $PT Eval Moderate Complexity: 1 Procedure PT Treatments $Therapeutic Activity: 8-22 mins   PT G CodesElberta Leatherwood, SPT [facility] [place] 03/06/2017, 2:31 PM

## 2017-03-07 DIAGNOSIS — G9340 Encephalopathy, unspecified: Secondary | ICD-10-CM

## 2017-03-07 LAB — CBC
HCT: 28 % — ABNORMAL LOW (ref 39.0–52.0)
Hemoglobin: 8.7 g/dL — ABNORMAL LOW (ref 13.0–17.0)
MCH: 27.7 pg (ref 26.0–34.0)
MCHC: 31.1 g/dL (ref 30.0–36.0)
MCV: 89.2 fL (ref 78.0–100.0)
PLATELETS: 278 10*3/uL (ref 150–400)
RBC: 3.14 MIL/uL — AB (ref 4.22–5.81)
RDW: 14.9 % (ref 11.5–15.5)
WBC: 11.2 10*3/uL — AB (ref 4.0–10.5)

## 2017-03-07 LAB — URINE CULTURE: CULTURE: NO GROWTH

## 2017-03-07 LAB — BASIC METABOLIC PANEL
Anion gap: 8 (ref 5–15)
BUN: 16 mg/dL (ref 6–20)
CHLORIDE: 105 mmol/L (ref 101–111)
CO2: 25 mmol/L (ref 22–32)
CREATININE: 1.18 mg/dL (ref 0.61–1.24)
Calcium: 8.2 mg/dL — ABNORMAL LOW (ref 8.9–10.3)
GFR calc non Af Amer: >60 mL/min (ref >60)
Glucose, Bld: 105 mg/dL — ABNORMAL HIGH (ref 65–99)
POTASSIUM: 3.8 mmol/L (ref 3.5–5.1)
SODIUM: 138 mmol/L (ref 135–145)

## 2017-03-07 LAB — GLUCOSE, CAPILLARY
GLUCOSE-CAPILLARY: 105 mg/dL — AB (ref 65–99)
GLUCOSE-CAPILLARY: 87 mg/dL (ref 65–99)

## 2017-03-07 MED ORDER — LISINOPRIL 20 MG PO TABS: 10.0000 mg | ORAL_TABLET | Freq: Every day | ORAL | Status: DC

## 2017-03-07 MED ORDER — BISACODYL 10 MG RE SUPP: 10.0000 mg | RECTAL | Status: DC | PRN

## 2017-03-07 MED ORDER — ACETAMINOPHEN 325 MG PO TABS: 650.0000 mg | ORAL_TABLET | ORAL | Status: DC | PRN

## 2017-03-07 MED ORDER — POLYETHYLENE GLYCOL 3350 17 G PO PACK: 17.0000 g | PACK | Freq: Two times a day (BID) | ORAL | Status: DC

## 2017-03-07 NOTE — Progress Notes (Signed)
Patient in a stable condition,discharge instructions reviewed with patient, he verbalized undetrstanding tele dc ccmd notified, iv removed, patient belongings at bedside, patient transported to [facility] by [employer]

## 2017-03-07 NOTE — Discharge Instructions (Signed)

## 2017-03-07 NOTE — Discharge Summary (Signed)
Physician Discharge Summary  Alex Bowman KXF:[id number] DOB: [birth year]  PCP: [name], MD  Admit date: 03/04/2017 Discharge date: 03/07/2017  Recommendations for Outpatient Follow-up:  1. M.D. at SNF in 5 days with repeat labs (CBC & BMP). Please follow final blood culture results that were sent from the hospital. 2. Dr. Consuella Bowman, Neurosurgery in 3 days. SNF to arrange follow-up. 3. Dr. Bernerd Bowman, PCP upon discharge from SNF.  Home Health: None Equipment/Devices: None    Discharge Condition: Improved and stable  CODE STATUS: Partial code. Okay for CPR but no intubation. Diet recommendation: Heart healthy and diabetic diet.  Discharge Diagnoses:  Principal Problem:   Sepsis ([place]) Active Problems:   Severe obesity (BMI >= 40) (HCC)   Dyslipidemia   Hypertension   Closed wedge compression fracture of T7 vertebra (HCC)   AF (paroxysmal atrial fibrillation) (HCC)   DM type 2 causing CKD stage 3 (HCC)   CKD (chronic kidney disease) stage 3, GFR 30-59 ml/min   Fall   Depression   Acute metabolic encephalopathy   Brief Summary: 71 year old male with PMH of HTN, HLD, DM 2, GERD, stage III chronic kidney disease, paroxysmal atrial fibrillation on Eliquis, morbid obesity, bilateral lower extremity edema, recent hospitalization 02/21/17-02/24/17 following the fall and sustained T7 compression fracture due to mechanical fall, seen by neurosurgery who recommended conservative treatment with pain control, ambulation with back brace, discharged to SNF, presented back to ED after he rolled out of his bed and fell on the floor on the morning of admission, confusion and decreased responsiveness, no report regarding LOC. Spouse reported some blood in urine recently. In the ED hypotensive with BP of 71/60 that improved to 105/96 after IV fluid hydration. CT head and C-spine negative for acute findings. CT abdomen and pelvis showed small bilateral pleural effusions and large stool  burden without acute abnormalities. Admitted to stepdown unit for further evaluation and management.   Assessment & Plan:   1. Hypotension: Present on admission. May be multifactorial related to poor oral intake due to sedation, Lasix and medications. Responded well to IV fluids. Admitted as possible sepsis but suspect less likelihood. Chest x-ray shows atelectasis without pneumonia. CT abdomen without acute findings. Blood cultures, negative to date. Urine microscopy, negative for UTI features. Urine culture: Negative. Lactate normal. Pro-calcitonin 0.54. Cortisol 57. Hypotension resolved. Low index of suspicion for infectious etiology and hence discontinued empirically started IV vancomycin and Zosyn.  2. Acute encephalopathy: Likely related to medications i.e. opioids, Ambien, Zoloft and from mild AKI. No focal neurological deficits. CT head without acute findings. Held these medications. Resolved. Avoid sedating medications, especially opioids as far as possible. He may have an element of mild dementia causing some confusion/sundowning at nights. 3. Essential hypertension:  temporarily held antihypertensives due to hypotension. Resumed Toprol-XL at lower dose yesterday. Increase Toprol-XL to prior home dose at discharge and restart lisinopril at reduced dose from 20 mg twice a day to 10 MG daily. Mildly uncontrolled at times. Monitor closely at SNF. 4. Hyperlipidemia: Resume Lipitor. 5. Recent T7 closed compression fracture: Judicious use of opioid pain medications. Back brace when up and about. PT and OT evaluation>  Recommend returning to SNF. Outpatient follow-up with neurosurgery. He hasn't complained of much pain in the hospital and has not been on opioids since admission. 6. Paroxysmal A. Fib: CHA2DS2-VASc Scoreis 3. Currently in sinus rhythm. On Eliquis at home, was switched to IV heparin while nothing by mouth due to sedation. Continue prior home dose of  Toprol-XL and Eliquis. 7. Type II  DM:  treated in the hospital with SSI with good control. Resume metformin at discharge. 8. Acute on stage III chronic kidney disease: Baseline creatinine 1.2-1.3. Presented with creatinine of 1.57, likely related to poor oral intake, dehydration, lisinopril and Lasix. Temporarily held his medications. Brief IV fluids. Creatinine has normalized to 1.18. Resume prior home dose of Lasix and lisinopril at reduced dose. Follow BMP closely. Acute kidney injury resolved. 9. Anxiety and depression:  continue Zoloft. Stable. 10. Fall at SNF: No details available. Apparently fell from bed. CT head and neck without acute findings. Return to SNF for rehabilitation. 11. Morbid obesity/Body mass index is 44.35 kg/m.' 12. Constipation: Aggressive bowel regimen. Minimize opioids. Mobilize. Adjusted bowel regimen. Had BM on 7/26. Large stool burden noted on recent CT. 13. Chronic anemia:  Hemoglobin was 8.2 on 8/26 morning. No bleeding reported. No melena. Repeated hemoglobin 8.7 same day. Baseline may be in the 9-10  range. Possibly hemodilution. Hemoglobin is stable in the 8.7 range since yesterday. Follow CBC closely as outpatient.   Consultants:  None   Procedures:  None   Discharge Instructions  Discharge Instructions    (HEART FAILURE PATIENTS) Call MD:  Anytime you have any of the following symptoms: 1) 3 pound weight gain in 24 hours or 5 pounds in 1 week 2) shortness of breath, with or without a dry hacking cough 3) swelling in the hands, feet or stomach 4) if you have to sleep on extra pillows at night in order to breathe.    Complete by:  As directed    Call MD for:    Complete by:  As directed    Recurrent altered mental status or confusion.   Call MD for:  difficulty breathing, headache or visual disturbances    Complete by:  As directed    Call MD for:  extreme fatigue    Complete by:  As directed    Call MD for:  persistant dizziness or light-headedness    Complete by:  As directed     Call MD for:  severe uncontrolled pain    Complete by:  As directed    Call MD for:  temperature >100.4    Complete by:  As directed    Diet - low sodium heart healthy    Complete by:  As directed    Diet Carb Modified    Complete by:  As directed    Discharge instructions    Complete by:  As directed    Wear back brace when up and out of bed.   Increase activity slowly    Complete by:  As directed        Medication List    STOP taking these medications   cephALEXin 500 MG capsule Commonly known as:  KEFLEX   oxyCODONE 5 MG immediate release tablet Commonly known as:  Oxy IR/ROXICODONE   zolpidem 6.25 MG CR tablet Commonly known as:  AMBIEN CR     TAKE these medications   acetaminophen 325 MG tablet Commonly known as:  TYLENOL Take 2 tablets (650 mg total) by mouth every 4 (four) hours as needed for mild pain or moderate pain (or Fever >/= 101). What changed:  medication strength  how much to take  when to take this  reasons to take this   apixaban 5 MG Tabs tablet Commonly known as:  ELIQUIS Take 1 tablet (5 mg total) by mouth 2 (two) times daily.   atorvastatin  40 MG tablet Commonly known as:  LIPITOR Take 40 mg by mouth every morning.   bisacodyl 10 MG suppository Commonly known as:  DULCOLAX Place 1 suppository (10 mg total) rectally as needed for moderate constipation.   cholecalciferol 1000 units tablet Commonly known as:  VITAMIN D Take 1,000 Units by mouth daily.   furosemide 20 MG tablet Commonly known as:  LASIX Take 20 mg by mouth daily.   LIDODERM 5 % Generic drug:  lidocaine Place 1 patch onto the skin daily. TO BE APPLIED TO PAINFUL AREA ON THE BACK/Remove & Discard patch within 12 hours or as directed by MD   lisinopril 20 MG tablet Commonly known as:  PRINIVIL,ZESTRIL Take 0.5 tablets (10 mg total) by mouth daily. What changed:  how much to take  when to take this   metFORMIN 500 MG tablet Commonly known as:   GLUCOPHAGE Take 500 mg by mouth 2 (two) times daily with a meal.   metoprolol succinate 50 MG 24 hr tablet Commonly known as:  TOPROL-XL Take 75mg  (1 and 1/2 tablets) twice a day by mouth What changed:  how much to take  how to take this  when to take this  additional instructions   multivitamin with minerals Tabs tablet Take 1 tablet by mouth daily.   polyethylene glycol packet Commonly known as:  MIRALAX / GLYCOLAX Take 17 g by mouth 2 (two) times daily. Hold for diarrhae What changed:  when to take this   potassium chloride 10 MEQ CR capsule Commonly known as:  MICRO-K Take 10 mEq by mouth daily.   senna 8.6 MG Tabs tablet Commonly known as:  SENOKOT Take 2 tablets (17.2 mg total) by mouth at bedtime.   sertraline 50 MG tablet Commonly known as:  ZOLOFT Take 50 mg by mouth daily.   solifenacin 5 MG tablet Commonly known as:  VESICARE Take 5 mg by mouth daily.      Follow-up Information    [name], MD Follow up.   Specialty:  Family Medicine Why:  Upon discharge from SNF. Contact information: [place] [address] [phone]        M.D. at SNF. Schedule an appointment as soon as possible for a visit in 5 day(s).   Why:  To be seen with repeat labs (CBC & BMP).       Alex Lose, MD. Schedule an appointment as soon as possible for a visit in 3 day(s).   Specialty:  Neurosurgery Contact information: [address] [place] [address] [place] [address] [phone]          No Known Allergies    Procedures/Studies: Ct Head Wo Contrast  Result Date: 03/04/2017 CLINICAL DATA:  Ct head/cspine wo, fall, confusion, best images possible due to pt movement ^117mL ISOVUE-300 IOPAMIDOL (ISOVUE-300) INJECTION 61%; Ct head, fall, confusion Ct head, fall, confusion Ct head/cspine wo, fall, confusion, best images possible due to pt movement EXAM: CT HEAD WITHOUT CONTRAST CT CERVICAL SPINE WITHOUT CONTRAST TECHNIQUE:  Multidetector CT imaging of the head and cervical spine was performed following the standard protocol without intravenous contrast. Multiplanar CT image reconstructions of the cervical spine were also generated. COMPARISON:  02/21/2017 FINDINGS: CT HEAD FINDINGS Brain: There is central and cortical atrophy. Periventricular white matter changes are consistent with small vessel disease. There is no intra or extra-axial fluid collection or mass lesion. The basilar cisterns and ventricles have a normal appearance. There is no CT evidence for acute infarction  or hemorrhage. Vascular: There is atherosclerotic calcification of the carotid siphons. Skull: Normal. Negative for fracture or focal lesion. Sinuses/Orbits: No acute finding. Other: None CT CERVICAL SPINE FINDINGS Alignment: There is loss of cervical lordosis. This may be secondary to splinting, soft tissue injury, or positioning. Skull base and vertebrae: No acute fracture. No primary bone lesion or focal pathologic process. Soft tissues and spinal canal: No prevertebral fluid or swelling. No visible canal hematoma. Disc levels: Significant disc height loss and mid cervical levels, predominantly at C4-5, C5-6, and C6-7. Upper chest: Negative. Other: Study quality is degraded by significant patient motion artifact. IMPRESSION: 1. Atrophy and small vessel disease. 2.  No evidence for acute intracranial abnormality. 3. Cervical spine exam is significantly degraded by patient motion artifact. No obvious abnormality identified . Electronically Signed   By: Nolon Nations M.D.   On: 03/04/2017 17:47   Ct Abdomen Pelvis W Contrast  Result Date: 03/04/2017 CLINICAL DATA:  Fall.  Pain.  Confusion. EXAM: CT ABDOMEN AND PELVIS WITH CONTRAST TECHNIQUE: Multidetector CT imaging of the abdomen and pelvis was performed using the standard protocol following bolus administration of intravenous contrast. CONTRAST:  80 cc ISOVUE-300 IOPAMIDOL (ISOVUE-300) INJECTION 61%  COMPARISON:  CT abdomen and pelvis on 04/23/2013 FINDINGS: Lower chest: There are small bilateral pleural effusions, right greater than left. Bibasilar atelectasis is present. No visible pneumothorax. There is significant three-vessel coronary artery disease. Hepatobiliary: Liver is homogeneous without focal lesion. Gallbladder is distended and otherwise normal in appearance. Pancreas: Unremarkable. No pancreatic ductal dilatation or surrounding inflammatory changes. Spleen: Normal in size without focal abnormality. Adrenals/Urinary Tract: Adrenal glands are normal in appearance. There is a new 2 mm nonobstructing intrarenal calcification in the midpole region of the right kidney. There is a 1 cm cyst within the midpole region of the right kidney. No hydronephrosis or ureteral obstruction. No suspicious renal mass. Urinary bladder is normal in appearance. Stomach/Bowel: Stomach and small bowel loops are normal in appearance. There is a large amount of stool throughout mildly distended loops of colon. Scattered diverticula are present. No acute diverticulitis. The appendix is well seen and has a normal appearance. Vascular/Lymphatic: There is moderate atherosclerosis of the abdominal aorta not associated with aneurysm. Nonspecific, small femoral lymph nodes are present. No significant adenopathy. Although atherosclerotic, there is normal vascular opacification of the celiac axis, superior mesenteric artery, and inferior mesenteric artery. Normal appearance of the portal venous system and inferior vena cava. Reproductive: Normal appearance of the seminal vesicles. Prostate contains calcifications and is normal in size. Other: Small fat containing paraumbilical hernia. Musculoskeletal: Partially imaged is T7 fracture, evaluated on recent CT 02/21/2017. No interval fractures are identified. IMPRESSION: 1. Bilateral pleural effusions and bibasilar atelectasis. 2. Partially imaged T7 fracture, recently evaluated. 3. No  evidence for acute injury of the abdomen or pelvis. 4. Coronary artery disease. 5.  Aortic atherosclerosis. 6. Large stool burden. Electronically Signed   By: Nolon Nations M.D.   On: 03/04/2017 18:04   Dg Chest Port 1 View  Result Date: 03/04/2017 CLINICAL DATA:  Altered mental status EXAM: PORTABLE CHEST 1 VIEW COMPARISON:  10/24/2016 FINDINGS: Low lung volumes with patchy atelectasis at the bases. Mild cardiomegaly with mild central congestion. Probable tiny right pleural effusion. Aortic atherosclerosis. Mildly nodular right perihilar opacity could relate to vascular structure. No pneumothorax. IMPRESSION: 1. Low lung volumes with patchy atelectasis at the bases and possible tiny right effusion 2. Stable cardiomegaly with mild central congestion Electronically Signed   By: Maudie Mercury  Francoise Ceo M.D.   On: 03/04/2017 15:16      Subjective: Patient alert and oriented. States that he feels "okay". No pain reported. Tolerating diet. As per RN, no acute issues.  Discharge Exam:  Vitals:   03/06/17 2335 03/07/17 0359 03/07/17 0755 03/07/17 1157  BP: 140/70 (!) 147/94 (!) 126/53 126/81  Pulse:  69 94 62  Resp: 20 18 (!) 21 20  Temp: 98.6 F (37 C) 98.6 F (37 C) 98.3 F (36.8 C) 98.7 F (37.1 C)  TempSrc: Oral Oral Oral Axillary  SpO2: 99% 100% 96% 96%  Weight:  (!) 138.9 kg (306 lb 3.5 oz)    Height:        General exam: Pleasant elderly male, moderately built and morbidly obese, lying comfortably supine in bed. Respiratory system:  Clear to auscultation. No increased work of breathing. Cardiovascular system: S1 & S2 heard, RRR. No JVD, murmurs, rubs, gallops or clicks.  Trace bilateral ankle edema.. Telemetry: Sinus rhythm with occasional PVCs. Gastrointestinal system: Abdomen is nondistended, soft and nontender. No organomegaly or masses felt. Normal bowel sounds heard.  Central nervous system:  Alert and oriented 3. No focal neurological deficits. Extremities: Symmetric 5 x 5  power. Skin: No rashes, lesions or ulcers Psychiatry: Judgement and insight intact. Mood & affect pleasant and appropriate.    The results of significant diagnostics from this hospitalization (including imaging, microbiology, ancillary and laboratory) are listed below for reference.     Microbiology: Recent Results (from the past 240 hour(s))  Urine Culture     Status: Abnormal   Collection Time: 02/25/17  2:56 PM  Result Value Ref Range Status   Specimen Description URINE, RANDOM  Final   Special Requests NONE  Final   Culture MULTIPLE SPECIES PRESENT, SUGGEST RECOLLECTION (A)  Final   Report Status 02/27/2017 FINAL  Final  Urine Culture     Status: None   Collection Time: 03/04/17  7:38 PM  Result Value Ref Range Status   Specimen Description URINE, RANDOM  Final   Special Requests NONE  Final   Culture NO GROWTH  Final   Report Status 03/07/2017 FINAL  Final  Blood Culture (routine x 2)     Status: None (Preliminary result)   Collection Time: 03/04/17  8:00 PM  Result Value Ref Range Status   Specimen Description BLOOD RIGHT HAND  Final   Special Requests IN PEDIATRIC BOTTLE Blood Culture adequate volume  Final   Culture NO GROWTH 3 DAYS  Final   Report Status PENDING  Incomplete  Blood Culture (routine x 2)     Status: None (Preliminary result)   Collection Time: 03/04/17 11:52 PM  Result Value Ref Range Status   Specimen Description BLOOD RIGHT FOREARM  Final   Special Requests IN PEDIATRIC BOTTLE Blood Culture adequate volume  Final   Culture NO GROWTH 2 DAYS  Final   Report Status PENDING  Incomplete     Labs: CBC:  Recent Labs Lab 03/04/17 1620 03/06/17 0257 03/06/17 0741 03/07/17 0518  WBC 12.1* 11.6* 11.1* 11.2*  NEUTROABS 9.3*  --   --   --   HGB 10.5* 8.2* 8.7* 8.7*  HCT 32.7* 26.8* 27.1* 28.0*  MCV 89.3 89.3 90.0 89.2  PLT 263 305 299 315   Basic Metabolic Panel:  Recent Labs Lab 03/04/17 1620 03/05/17 0520 03/06/17 0257 03/07/17 0518  NA  135 137 136 138  K 4.5 4.0 3.6 3.8  CL 101 103 104 105  CO2 23 24  21* 25  GLUCOSE 113* 111* 100* 105*  BUN 36* 31* 24* 16  CREATININE 1.57* 1.36* 1.30* 1.18  CALCIUM 8.3* 8.1* 7.7* 8.2*   Liver Function Tests:  Recent Labs Lab 03/04/17 1620  AST 54*  ALT 33  ALKPHOS 85  BILITOT 1.2  PROT 6.2*  ALBUMIN 2.6*   Cardiac Enzymes:  Recent Labs Lab 03/04/17 2000  CKTOTAL 94   CBG:  Recent Labs Lab 03/06/17 1621 03/06/17 2106 03/06/17 2335 03/07/17 0625 03/07/17 1236  GLUCAP 114* 120* 102* 105* 87    Urinalysis    Component Value Date/Time   COLORURINE YELLOW 03/04/2017 1938   APPEARANCEUR CLEAR 03/04/2017 1938   LABSPEC 1.029 03/04/2017 1938   PHURINE 5.0 03/04/2017 1938   GLUCOSEU NEGATIVE 03/04/2017 1938   HGBUR NEGATIVE 03/04/2017 1938   BILIRUBINUR NEGATIVE 03/04/2017 1938   KETONESUR NEGATIVE 03/04/2017 1938   PROTEINUR NEGATIVE 03/04/2017 1938   UROBILINOGEN 0.2 04/23/2013 1619   NITRITE NEGATIVE 03/04/2017 1938   LEUKOCYTESUR NEGATIVE 03/04/2017 1938    I called and discussed with patient's spouse. Updated care and answered questions.  Time coordinating discharge: Over 30 minutes  SIGNED:  Vernell Leep, MD, FACP, [place]. [facility] Pager [phone]  If 7PM-7AM, please contact night-coverage [email or web address] Password [facility] 03/07/2017, 1:59 PM

## 2017-03-07 NOTE — Progress Notes (Signed)
This RN called Alex Bowman place twice to give report but call was not answered, will continue to monitor patient.

## 2017-03-07 NOTE — Progress Notes (Signed)
Clinical Social Worker facilitated patient discharge including contacting patient family and facility to confirm patient discharge plans.  Clinical information faxed to facility and family agreeable with plan.  CSW arranged ambulance transport via [facility] to [facility]  RN to call [phone] (pt will go into rm# [phone]) report prior to discharge.  Clinical Social Worker will sign off for now as social work intervention is no longer needed. Please consult [place] again if new need arises.  Rhea Pink, MSW, [place]

## 2017-03-09 LAB — CULTURE, BLOOD (ROUTINE X 2)
Culture: NO GROWTH
SPECIAL REQUESTS: ADEQUATE

## 2017-03-10 LAB — CULTURE, BLOOD (ROUTINE X 2)
Culture: NO GROWTH
SPECIAL REQUESTS: ADEQUATE

## 2017-04-12 DIAGNOSIS — N390 Urinary tract infection, site not specified: Secondary | ICD-10-CM

## 2017-04-12 HISTORY — DX: Urinary tract infection, site not specified: N39.0

## 2017-04-15 ENCOUNTER — Inpatient Hospital Stay (HOSPITAL_COMMUNITY)
Admission: EM | Admit: 2017-04-15 | Discharge: 2017-04-20 | DRG: 871 | Disposition: A | Discharge status: Hospice | Payer: Medicare Other | Attending: Internal Medicine | Admitting: Internal Medicine

## 2017-04-15 ENCOUNTER — Emergency Department (HOSPITAL_COMMUNITY): Payer: Medicare Other

## 2017-04-15 ENCOUNTER — Encounter (HOSPITAL_COMMUNITY): Payer: Self-pay | Admitting: Emergency Medicine

## 2017-04-15 DIAGNOSIS — Z87891 Personal history of nicotine dependence: Secondary | ICD-10-CM

## 2017-04-15 DIAGNOSIS — J181 Lobar pneumonia, unspecified organism: Secondary | ICD-10-CM

## 2017-04-15 DIAGNOSIS — Z7984 Long term (current) use of oral hypoglycemic drugs: Secondary | ICD-10-CM

## 2017-04-15 DIAGNOSIS — N3000 Acute cystitis without hematuria: Secondary | ICD-10-CM | POA: Diagnosis present

## 2017-04-15 DIAGNOSIS — R296 Repeated falls: Secondary | ICD-10-CM | POA: Diagnosis present

## 2017-04-15 DIAGNOSIS — Z803 Family history of malignant neoplasm of breast: Secondary | ICD-10-CM

## 2017-04-15 DIAGNOSIS — N184 Chronic kidney disease, stage 4 (severe): Secondary | ICD-10-CM | POA: Diagnosis present

## 2017-04-15 DIAGNOSIS — J189 Pneumonia, unspecified organism: Secondary | ICD-10-CM

## 2017-04-15 DIAGNOSIS — I480 Paroxysmal atrial fibrillation: Secondary | ICD-10-CM | POA: Diagnosis not present

## 2017-04-15 DIAGNOSIS — N183 Chronic kidney disease, stage 3 unspecified: Secondary | ICD-10-CM | POA: Diagnosis present

## 2017-04-15 DIAGNOSIS — A419 Sepsis, unspecified organism: Secondary | ICD-10-CM | POA: Diagnosis present

## 2017-04-15 DIAGNOSIS — E785 Hyperlipidemia, unspecified: Secondary | ICD-10-CM | POA: Diagnosis present

## 2017-04-15 DIAGNOSIS — G9341 Metabolic encephalopathy: Secondary | ICD-10-CM | POA: Diagnosis present

## 2017-04-15 DIAGNOSIS — Z8042 Family history of malignant neoplasm of prostate: Secondary | ICD-10-CM

## 2017-04-15 DIAGNOSIS — D649 Anemia, unspecified: Secondary | ICD-10-CM | POA: Diagnosis present

## 2017-04-15 DIAGNOSIS — B961 Klebsiella pneumoniae [K. pneumoniae] as the cause of diseases classified elsewhere: Secondary | ICD-10-CM | POA: Diagnosis present

## 2017-04-15 DIAGNOSIS — E1122 Type 2 diabetes mellitus with diabetic chronic kidney disease: Secondary | ICD-10-CM | POA: Diagnosis present

## 2017-04-15 DIAGNOSIS — K219 Gastro-esophageal reflux disease without esophagitis: Secondary | ICD-10-CM | POA: Diagnosis not present

## 2017-04-15 DIAGNOSIS — A4150 Gram-negative sepsis, unspecified: Secondary | ICD-10-CM | POA: Diagnosis not present

## 2017-04-15 DIAGNOSIS — Z6841 Body Mass Index (BMI) 40.0 and over, adult: Secondary | ICD-10-CM

## 2017-04-15 DIAGNOSIS — L8990 Pressure ulcer of unspecified site, unspecified stage: Secondary | ICD-10-CM | POA: Insufficient documentation

## 2017-04-15 DIAGNOSIS — N179 Acute kidney failure, unspecified: Secondary | ICD-10-CM | POA: Diagnosis not present

## 2017-04-15 DIAGNOSIS — Z87440 Personal history of urinary (tract) infections: Secondary | ICD-10-CM

## 2017-04-15 DIAGNOSIS — N390 Urinary tract infection, site not specified: Secondary | ICD-10-CM

## 2017-04-15 DIAGNOSIS — E118 Type 2 diabetes mellitus with unspecified complications: Secondary | ICD-10-CM

## 2017-04-15 DIAGNOSIS — I959 Hypotension, unspecified: Secondary | ICD-10-CM | POA: Diagnosis not present

## 2017-04-15 DIAGNOSIS — I482 Chronic atrial fibrillation: Secondary | ICD-10-CM | POA: Diagnosis present

## 2017-04-15 DIAGNOSIS — E8770 Fluid overload, unspecified: Secondary | ICD-10-CM | POA: Diagnosis present

## 2017-04-15 DIAGNOSIS — M4854XA Collapsed vertebra, not elsewhere classified, thoracic region, initial encounter for fracture: Secondary | ICD-10-CM | POA: Diagnosis present

## 2017-04-15 DIAGNOSIS — R0602 Shortness of breath: Secondary | ICD-10-CM

## 2017-04-15 DIAGNOSIS — N189 Chronic kidney disease, unspecified: Secondary | ICD-10-CM

## 2017-04-15 DIAGNOSIS — Z87442 Personal history of urinary calculi: Secondary | ICD-10-CM

## 2017-04-15 DIAGNOSIS — Z7901 Long term (current) use of anticoagulants: Secondary | ICD-10-CM

## 2017-04-15 DIAGNOSIS — E875 Hyperkalemia: Secondary | ICD-10-CM | POA: Diagnosis present

## 2017-04-15 DIAGNOSIS — F329 Major depressive disorder, single episode, unspecified: Secondary | ICD-10-CM | POA: Diagnosis present

## 2017-04-15 DIAGNOSIS — I129 Hypertensive chronic kidney disease with stage 1 through stage 4 chronic kidney disease, or unspecified chronic kidney disease: Secondary | ICD-10-CM | POA: Diagnosis present

## 2017-04-15 DIAGNOSIS — J690 Pneumonitis due to inhalation of food and vomit: Secondary | ICD-10-CM | POA: Diagnosis present

## 2017-04-15 DIAGNOSIS — Z515 Encounter for palliative care: Secondary | ICD-10-CM | POA: Diagnosis not present

## 2017-04-15 DIAGNOSIS — Z66 Do not resuscitate: Secondary | ICD-10-CM | POA: Diagnosis present

## 2017-04-15 DIAGNOSIS — Z7189 Other specified counseling: Secondary | ICD-10-CM

## 2017-04-15 DIAGNOSIS — S22060A Wedge compression fracture of T7-T8 vertebra, initial encounter for closed fracture: Secondary | ICD-10-CM | POA: Diagnosis present

## 2017-04-15 DIAGNOSIS — R109 Unspecified abdominal pain: Secondary | ICD-10-CM

## 2017-04-15 HISTORY — DX: Urinary tract infection, site not specified: N39.0

## 2017-04-15 HISTORY — DX: Chronic kidney disease, stage 3 unspecified: N18.30

## 2017-04-15 HISTORY — DX: Chronic kidney disease, stage 3 (moderate): N18.3

## 2017-04-15 HISTORY — DX: Personal history of urinary calculi: Z87.442

## 2017-04-15 HISTORY — DX: Cardiac arrhythmia, unspecified: I49.9

## 2017-04-15 LAB — CBG MONITORING, ED: GLUCOSE-CAPILLARY: 80 mg/dL (ref 65–99)

## 2017-04-15 LAB — URINALYSIS, ROUTINE W REFLEX MICROSCOPIC
Bilirubin Urine: NEGATIVE
GLUCOSE, UA: NEGATIVE mg/dL
Hgb urine dipstick: NEGATIVE
Ketones, ur: NEGATIVE mg/dL
NITRITE: NEGATIVE
PH: 5 (ref 5.0–8.0)
PROTEIN: 100 mg/dL — AB
Specific Gravity, Urine: 1.015 (ref 1.005–1.030)

## 2017-04-15 LAB — COMPREHENSIVE METABOLIC PANEL
ALK PHOS: 138 U/L — AB (ref 38–126)
ALT: 39 U/L (ref 17–63)
ANION GAP: 13 (ref 5–15)
AST: 51 U/L — AB (ref 15–41)
Albumin: 2.5 g/dL — ABNORMAL LOW (ref 3.5–5.0)
BILIRUBIN TOTAL: 0.5 mg/dL (ref 0.3–1.2)
BUN: 92 mg/dL — AB (ref 6–20)
CALCIUM: 8.9 mg/dL (ref 8.9–10.3)
CO2: 22 mmol/L (ref 22–32)
Chloride: 98 mmol/L — ABNORMAL LOW (ref 101–111)
Creatinine, Ser: 4.18 mg/dL — ABNORMAL HIGH (ref 0.61–1.24)
GFR calc Af Amer: 15 mL/min — ABNORMAL LOW (ref >60)
GFR, EST NON AFRICAN AMERICAN: 13 mL/min — AB (ref >60)
GLUCOSE: 101 mg/dL — AB (ref 65–99)
Potassium: 5.8 mmol/L — ABNORMAL HIGH (ref 3.5–5.1)
Sodium: 133 mmol/L — ABNORMAL LOW (ref 135–145)
TOTAL PROTEIN: 7 g/dL (ref 6.5–8.1)

## 2017-04-15 LAB — CBC WITH DIFFERENTIAL/PLATELET
BASOS ABS: 0 10*3/uL (ref 0.0–0.1)
BASOS PCT: 0 %
EOS PCT: 2 %
Eosinophils Absolute: 0.3 10*3/uL (ref 0.0–0.7)
HCT: 35.9 % — ABNORMAL LOW (ref 39.0–52.0)
Hemoglobin: 11 g/dL — ABNORMAL LOW (ref 13.0–17.0)
Lymphocytes Relative: 21 %
Lymphs Abs: 2.7 10*3/uL (ref 0.7–4.0)
MCH: 27 pg (ref 26.0–34.0)
MCHC: 30.6 g/dL (ref 30.0–36.0)
MCV: 88.2 fL (ref 78.0–100.0)
MONO ABS: 1 10*3/uL (ref 0.1–1.0)
Monocytes Relative: 8 %
Neutro Abs: 9 10*3/uL — ABNORMAL HIGH (ref 1.7–7.7)
Neutrophils Relative %: 69 %
PLATELETS: 209 10*3/uL (ref 150–400)
RBC: 4.07 MIL/uL — ABNORMAL LOW (ref 4.22–5.81)
RDW: 15.6 % — AB (ref 11.5–15.5)
WBC: 13 10*3/uL — ABNORMAL HIGH (ref 4.0–10.5)

## 2017-04-15 LAB — I-STAT CG4 LACTIC ACID, ED
LACTIC ACID, VENOUS: 1.23 mmol/L (ref 0.5–1.9)
Lactic Acid, Venous: 1.26 mmol/L (ref 0.5–1.9)

## 2017-04-15 LAB — HEMOGLOBIN A1C
HEMOGLOBIN A1C: 5.6 % (ref 4.8–5.6)
MEAN PLASMA GLUCOSE: 114.02 mg/dL

## 2017-04-15 LAB — GLUCOSE, CAPILLARY: Glucose-Capillary: 106 mg/dL — ABNORMAL HIGH (ref 65–99)

## 2017-04-15 LAB — LIPASE, BLOOD: LIPASE: 26 U/L (ref 11–51)

## 2017-04-15 MED ORDER — APIXABAN 5 MG PO TABS
5.0000 mg | ORAL_TABLET | Freq: Two times a day (BID) | ORAL | Status: DC
  Administered 2017-04-15 – 2017-04-17 (×3): 5 mg via ORAL
  Filled 2017-04-15 (×5): qty 1

## 2017-04-15 MED ORDER — BISACODYL 10 MG RE SUPP: 10.0000 mg | Freq: Every day | RECTAL | Status: DC | PRN

## 2017-04-15 MED ORDER — HYDROCODONE-ACETAMINOPHEN 5-325 MG PO TABS
1.0000 | ORAL_TABLET | ORAL | Status: DC | PRN
  Administered 2017-04-15: 2 via ORAL
  Filled 2017-04-15: qty 2

## 2017-04-15 MED ORDER — LIDOCAINE 5 % EX PTCH
1.0000 | MEDICATED_PATCH | CUTANEOUS | Status: DC
  Administered 2017-04-16 – 2017-04-17 (×2): 1 via TRANSDERMAL
  Filled 2017-04-15 (×2): qty 1

## 2017-04-15 MED ORDER — SODIUM CHLORIDE 0.9 % IV BOLUS (SEPSIS)
1000.0000 mL | Freq: Once | INTRAVENOUS | Status: AC
  Administered 2017-04-15: 1000 mL via INTRAVENOUS

## 2017-04-15 MED ORDER — FENTANYL 12 MCG/HR TD PT72
25.0000 ug | MEDICATED_PATCH | TRANSDERMAL | Status: DC
  Administered 2017-04-16 – 2017-04-19 (×2): 25 ug via TRANSDERMAL
  Filled 2017-04-15 (×2): qty 2

## 2017-04-15 MED ORDER — SENNOSIDES-DOCUSATE SODIUM 8.6-50 MG PO TABS
1.0000 | ORAL_TABLET | Freq: Every evening | ORAL | Status: DC | PRN
  Filled 2017-04-15: qty 1

## 2017-04-15 MED ORDER — DEXTROSE 5 % IV SOLN
1.0000 g | Freq: Once | INTRAVENOUS | Status: AC
  Administered 2017-04-15: 1 g via INTRAVENOUS
  Filled 2017-04-15: qty 10

## 2017-04-15 MED ORDER — LORAZEPAM 2 MG/ML IJ SOLN
1.0000 mg | Freq: Once | INTRAMUSCULAR | Status: AC
  Administered 2017-04-15: 1 mg via INTRAVENOUS
  Filled 2017-04-15: qty 1

## 2017-04-15 MED ORDER — ONDANSETRON HCL 4 MG/2ML IJ SOLN: 4.0000 mg | Freq: Four times a day (QID) | INTRAMUSCULAR | Status: DC | PRN

## 2017-04-15 MED ORDER — SERTRALINE HCL 50 MG PO TABS
50.0000 mg | ORAL_TABLET | Freq: Every day | ORAL | Status: DC
  Administered 2017-04-16 – 2017-04-17 (×2): 50 mg via ORAL
  Filled 2017-04-15 (×3): qty 1

## 2017-04-15 MED ORDER — INSULIN ASPART 100 UNIT/ML ~~LOC~~ SOLN
0.0000 [IU] | Freq: Three times a day (TID) | SUBCUTANEOUS | Status: DC
Start: 2017-04-15 — End: 2017-04-17

## 2017-04-15 MED ORDER — ACETAMINOPHEN 325 MG PO TABS: 650.0000 mg | ORAL_TABLET | Freq: Four times a day (QID) | ORAL | Status: DC | PRN

## 2017-04-15 MED ORDER — ONDANSETRON HCL 4 MG PO TABS: 4.0000 mg | ORAL_TABLET | Freq: Four times a day (QID) | ORAL | Status: DC | PRN

## 2017-04-15 MED ORDER — DARIFENACIN HYDROBROMIDE ER 7.5 MG PO TB24
7.5000 mg | ORAL_TABLET | Freq: Every day | ORAL | Status: DC
  Administered 2017-04-16 – 2017-04-17 (×2): 7.5 mg via ORAL
  Filled 2017-04-15 (×2): qty 1

## 2017-04-15 MED ORDER — METOPROLOL SUCCINATE ER 50 MG PO TB24
75.0000 mg | ORAL_TABLET | Freq: Two times a day (BID) | ORAL | Status: DC
  Administered 2017-04-16: 75 mg via ORAL
  Filled 2017-04-15 (×3): qty 1

## 2017-04-15 MED ORDER — SODIUM CHLORIDE 0.9 % IV SOLN
INTRAVENOUS | Status: DC
  Administered 2017-04-15: 17:00:00 via INTRAVENOUS

## 2017-04-15 MED ORDER — FUROSEMIDE 20 MG PO TABS
20.0000 mg | ORAL_TABLET | Freq: Every day | ORAL | Status: DC
  Administered 2017-04-16: 20 mg via ORAL
  Filled 2017-04-15 (×2): qty 1

## 2017-04-15 MED ORDER — ATORVASTATIN CALCIUM 40 MG PO TABS
40.0000 mg | ORAL_TABLET | Freq: Every morning | ORAL | Status: DC
  Administered 2017-04-16 – 2017-04-17 (×2): 40 mg via ORAL
  Filled 2017-04-15 (×2): qty 1

## 2017-04-15 MED ORDER — LISINOPRIL 10 MG PO TABS
10.0000 mg | ORAL_TABLET | Freq: Every day | ORAL | Status: DC
  Administered 2017-04-16: 10 mg via ORAL
  Filled 2017-04-15: qty 1

## 2017-04-15 MED ORDER — DEXTROSE 5 % IV SOLN
1.0000 g | INTRAVENOUS | Status: DC
  Administered 2017-04-16: 1 g via INTRAVENOUS
  Filled 2017-04-15 (×2): qty 10

## 2017-04-15 MED ORDER — ACETAMINOPHEN 650 MG RE SUPP: 650.0000 mg | Freq: Four times a day (QID) | RECTAL | Status: DC | PRN

## 2017-04-15 MED ORDER — HYDRALAZINE HCL 20 MG/ML IJ SOLN: 5.0000 mg | Freq: Three times a day (TID) | INTRAMUSCULAR | Status: DC | PRN

## 2017-04-15 NOTE — ED Notes (Signed)
Updated wife, Rasheed Welty, on bed assignment on [phone]

## 2017-04-15 NOTE — ED Triage Notes (Signed)
Pt arrives from [facility] via [facility] reporting potassium of 6.9 and WBC 29 in routine labs and concerns for sepsis.  Pt denies CP, dysuria.  Pt reports being at [facility] for compression fx.  Pt c/o chronic back pain. Pt oriented to self, disoriented to time, place and situation.

## 2017-04-15 NOTE — ED Notes (Signed)
Wife, Shem Plemmons at [phone] would like to be called when pt has bed assignment upstairs

## 2017-04-15 NOTE — H&P (Signed)
History and Physical    Alex Bowman:[id number] DOB: 1945-09-08 DOA: 04/15/2017   PCP: [name], MD   Patient coming from:  Home    Chief Complaint: abnormal labs   HPI: Alex Bowman is a 71 y.o. male with medical history significant for  CK D, hypertension, hyperlipidemia, the current UTIs, diabetes, depression, chronic back pain due to compression fracture currently at a rehabilitation facility, brought to the ED after being found to have elevated potassium level to 6.9, as well as a white blood cell count of 29,000. Of note, these labs were obtained yesterday after the patient showed mild confusion, with increased generalized weakness. He has been increasingly deconditioned since placement at the rehabilitation, with decreased appetite possible weight loss. He has not been very active. Of note, nursing notes report fold smelling urine at the time. Wife reports that the patient has not complained of any shortness of breath or chest pain. He has chronic lower extremity swelling, but this has not been worsened. No apparent disorder of gross hematuria. No nausea, vomiting or diarrhea. He is compliant with his medications last taken this morning. No alcohol, tobacco or recreational drug use.  ED Course:  BP 98/66 (BP Location: Left Arm)   Pulse (!) 119   Temp (!) 97.5 F (36.4 C) (Oral)   Resp 16   Ht 5\' 10"  (1.778 m)   Wt (!) 140.6 kg (310 lb)   SpO2 98%   BMI 44.48 kg/m    Sodium 133 potassium 5.8 creatinine 4.18 albumin 2.5 AST 51 white count 13 hemoglobin 11 Urine with large leukocytes Urine culture pending Blood culture pending Lactic Acid 1.23  EKG Atrial fibrillation Borderline prolonged QT interval No significant change was found  Rocephin IV IVF 2 L   Review of Systems:  As per HPI otherwise all other systems reviewed and are negative  Past Medical History:  Diagnosis Date  . Arthritis   . Bilateral lower extremity edema    Chronic, with venous insufficiency    . Cataract    beginning  . Chronic kidney disease    kidney stones  . Complication of anesthesia    stayed awake during colonoscopy  . Diabetes mellitus without complication ([place])    "on medicine, but numbers have been good for years since weight loss"  . Dyslipidemia    Monitored by Primary Physician. On statin  . GERD (gastroesophageal reflux disease)   . Hypertension   . Knee pain    R>L  . Morbid obesity with BMI of 45.0-49.9, adult ([place])    Attempted weight loss efforts at last visit had BMI down to 42, unfortunately back up to 46 with weight going up to 312 lb from 285 lb  . Pneumonia 20 years ago   h/o    Past Surgical History:  Procedure Laterality Date  . 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 Mild elevation in LVDP, EF 55%. --> False-positive stress test  . CARDIOVERSION N/A 11/27/2016   Procedure: CARDIOVERSION;  Surgeon: Skeet Latch, MD;  Location: [facility];  Service: Cardiovascular;  Laterality: N/A;  . COLONOSCOPY    . CYSTOSCOPY WITH STENT PLACEMENT Left 04/19/2013   Procedure: CYSTOSCOPY WITH STENT PLACEMENT;  Surgeon: Alexis Frock, MD;  Location: [facility];  Service: Urology;  Laterality: Left;  RIGHT RETROGRADE PYELOGRAM  . CYSTOSCOPY WITH URETEROSCOPY Left 04/21/2013   Procedure: CYSTOSCOPY WITH URETEROSCOPY bilateral retrograde, bilateral stent change, stone extraction;  Surgeon:  Alexis Frock, MD;  Location: [facility];  Service: Urology;  Laterality: Left;  bilateral ureters  . HAND SURGERY Bilateral 15 years ago  . HOLMIUM LASER APPLICATION Right 11/19/7351   Procedure: HOLMIUM LASER APPLICATION;  Surgeon: Alexis Frock, MD;  Location: [facility];  Service: Urology;  Laterality: Right;  . NEPHROLITHOTOMY Right 04/19/2013   Procedure: NEPHROLITHOTOMY PERCUTANEOUS;  Surgeon: Alexis Frock, MD;  Location: [facility];  Service: Urology;  Laterality: Right;  . NEPHROLITHOTOMY Right 04/21/2013   Procedure: NEPHROLITHOTOMY  PERCUTANEOUS RIGHT 2ND STAGE PERCUTANEOUS NEPHROLITHOTOMY;  Surgeon: Alexis Frock, MD;  Location: [facility];  Service: Urology;  Laterality: Right;  . NM MYOVIEW LTD; [place]  May 2012   Suggested as high risk with 3 times a day 1.2 to, mild to moderate 3 times a day affecting basolateral and mid lateral walls.  [name] POLYPECTOMY      Social History Social History   Social History  . Marital status: Married    Spouse name: N/A  . Number of children: N/A  . Years of education: N/A   Occupational History  . Not on file.   Social History Main Topics  . Smoking status: Former Smoker    Packs/day: 1.00    Years: 30.00    Types: Cigarettes    Quit date: 03/02/1997  . Smokeless tobacco: Former Systems developer    Types: Chew    Quit date: 08/12/2002  . Alcohol use No  . Drug use: No  . Sexual activity: Not on file   Other Topics Concern  . Not on file   Social History Narrative   He is a married father of 2, grandfather of one. Quit smoking over 15 years ago. Does not drink. Limited exercise due to knees and hip pain.     No Known Allergies  Family History  Problem Relation Age of Onset  . Breast cancer Mother   . Prostate cancer Father   . Colon cancer Neg Hx       Prior to Admission medications   Medication Sig Start Date End Date Taking? Authorizing Provider  acetaminophen (TYLENOL) 325 MG tablet Take 2 tablets (650 mg total) by mouth every 4 (four) hours as needed for mild pain or moderate pain (or Fever >/= 101). 03/07/17   Hongalgi, Lenis Dickinson, MD  apixaban (ELIQUIS) 5 MG TABS tablet Take 1 tablet (5 mg total) by mouth 2 (two) times daily. 10/30/16   Sherran Needs, NP  atorvastatin (LIPITOR) 40 MG tablet Take 40 mg by mouth every morning.    [provider]  bisacodyl (DULCOLAX) 10 MG suppository Place 1 suppository (10 mg total) rectally as needed for moderate constipation. 03/07/17   Hongalgi, Lenis Dickinson, MD  cholecalciferol (VITAMIN D) 1000 UNITS tablet Take 1,000 Units by  mouth daily.    [provider]  furosemide (LASIX) 20 MG tablet Take 20 mg by mouth daily.    [provider]  lidocaine (LIDODERM) 5 % Place 1 patch onto the skin daily. TO BE APPLIED TO PAINFUL AREA ON THE BACK/Remove & Discard patch within 12 hours or as directed by MD    [provider]  lisinopril (PRINIVIL,ZESTRIL) 20 MG tablet Take 0.5 tablets (10 mg total) by mouth daily. 03/07/17   Hongalgi, Lenis Dickinson, MD  metFORMIN (GLUCOPHAGE) 500 MG tablet Take 500 mg by mouth 2 (two) times daily with a meal.    [provider]  metoprolol succinate (TOPROL-XL) 50 MG 24 hr tablet Take 75mg  (1 and 1/2  tablets) twice a day by mouth Patient taking differently: Take 75 mg by mouth 2 (two) times daily.  12/04/16   Sherran Needs, NP  Multiple Vitamin (MULTIVITAMIN WITH MINERALS) TABS tablet Take 1 tablet by mouth daily.    [provider]  polyethylene glycol (MIRALAX / GLYCOLAX) packet Take 17 g by mouth 2 (two) times daily. Hold for diarrhae 03/07/17   Hongalgi, Everlene Farrier D, MD  potassium chloride (MICRO-K) 10 MEQ CR capsule Take 10 mEq by mouth daily.    [provider]  senna (SENOKOT) 8.6 MG TABS tablet Take 2 tablets (17.2 mg total) by mouth at bedtime. 02/24/17   Debbe Odea, MD  sertraline (ZOLOFT) 50 MG tablet Take 50 mg by mouth daily.    [provider]  solifenacin (VESICARE) 5 MG tablet Take 5 mg by mouth daily.    [provider]    Physical Exam:  Vitals:   04/15/17 1345 04/15/17 1415 04/15/17 1418 04/15/17 1420  BP: 92/70 (!) 72/52 98/66 98/66   Pulse:    (!) 119  Resp:  (!) 22 (!) 21 16  Temp:      TempSrc:      SpO2:      Weight:      Height:       Constitutional: NAD, calm, chronically ill appearing  Eyes: PERRL, lids and conjunctivae normal ENMT: Mucous membranes are moist, without exudate or lesions  Neck: normal, supple, no masses, no thyromegaly Respiratory: clear to auscultation bilaterally, no wheezing,  no crackles. Normal respiratory effort  Cardiovascular: irregularly irregular rate and rythm, no  murmur, rubs or gallops. Trace lower extremity edema. 2+ pedal pulses. No carotid bruits.  Abdomen: Soft, non tender, No hepatosplenomegaly. Bowel sounds positive.  Musculoskeletal: no clubbing / cyanosis. Moves all extremities Skin: no jaundice, No lesions. Bilateral  Chronic venous stasis changes in B LE Neurologic: Sensation intact  Strength equal in all extremities Psychiatric:   Alert and oriented x 3. Flat affect     Labs on Admission: I have personally reviewed following labs and imaging studies  CBC:  Recent Labs Lab 04/15/17 1148  WBC 13.0*  NEUTROABS 9.0*  HGB 11.0*  HCT 35.9*  MCV 88.2  PLT 202    Basic Metabolic Panel:  Recent Labs Lab 04/15/17 1148  NA 133*  K 5.8*  CL 98*  CO2 22  GLUCOSE 101*  BUN 92*  CREATININE 4.18*  CALCIUM 8.9    GFR: Estimated Creatinine Clearance: 22.9 mL/min (A) (by C-G formula based on SCr of 4.18 mg/dL (H)).  Liver Function Tests:  Recent Labs Lab 04/15/17 1148  AST 51*  ALT 39  ALKPHOS 138*  BILITOT 0.5  PROT 7.0  ALBUMIN 2.5*    Recent Labs Lab 04/15/17 1148  LIPASE 26   No results for input(s): AMMONIA in the last 168 hours.  Coagulation Profile: No results for input(s): INR, PROTIME in the last 168 hours.  Cardiac Enzymes: No results for input(s): CKTOTAL, CKMB, CKMBINDEX, TROPONINI in the last 168 hours.  BNP (last 3 results) No results for input(s): PROBNP in the last 8760 hours.  HbA1C: No results for input(s): HGBA1C in the last 72 hours.  CBG: No results for input(s): GLUCAP in the last 168 hours.  Lipid Profile: No results for input(s): CHOL, HDL, LDLCALC, TRIG, CHOLHDL, LDLDIRECT in the last 72 hours.  Thyroid Function Tests: No results for input(s): TSH, T4TOTAL, FREET4, T3FREE, THYROIDAB in the last 72 hours.  Anemia Panel: No results for  input(s): VITAMINB12, FOLATE, FERRITIN,  TIBC, IRON, RETICCTPCT in the last 72 hours.  Urine analysis:    Component Value Date/Time   COLORURINE AMBER (A) 04/15/2017 1152   APPEARANCEUR HAZY (A) 04/15/2017 1152   LABSPEC 1.015 04/15/2017 1152   PHURINE 5.0 04/15/2017 1152   GLUCOSEU NEGATIVE 04/15/2017 1152   HGBUR NEGATIVE 04/15/2017 1152   BILIRUBINUR NEGATIVE 04/15/2017 1152   KETONESUR NEGATIVE 04/15/2017 1152   PROTEINUR 100 (A) 04/15/2017 1152   UROBILINOGEN 0.2 04/23/2013 1619   NITRITE NEGATIVE 04/15/2017 1152   LEUKOCYTESUR LARGE (A) 04/15/2017 1152    Sepsis Labs: @LABRCNTIP (procalcitonin:4,lacticidven:4) )No results found for this or any previous visit (from the past 240 hour(s)).   Radiological Exams on Admission: Dg Chest Portable 1 View  Result Date: 04/15/2017 CLINICAL DATA:  Weakness, chronic back pain EXAM: PORTABLE CHEST 1 VIEW COMPARISON:  03/04/2017 FINDINGS: The heart size and mediastinal contours are within normal limits. Both lungs are clear. The visualized skeletal structures are unremarkable. IMPRESSION: No active disease. Electronically Signed   By: Kathreen Devoid   On: 04/15/2017 12:27   Ct Renal Stone Study  Addendum Date: 04/15/2017   ADDENDUM REPORT: 04/15/2017 14:23 ADDENDUM: Distended gallbladder with subtle pericholecystic edema raises the question of cholecystitis. Abdominal ultrasound may prove helpful to further evaluate. I personally discussed this addendum with Dr. Venora Maples at 1423 hours on 04/15/2017. Electronically Signed   By: Misty Stanley M.D.   On: 04/15/2017 14:23   Result Date: 04/15/2017 CLINICAL DATA:  Bilateral low back pain. EXAM: CT ABDOMEN AND PELVIS WITHOUT CONTRAST TECHNIQUE: Multidetector CT imaging of the abdomen and pelvis was performed following the standard protocol without IV contrast. COMPARISON:  03/04/2017 FINDINGS: Lower chest: Dependent atelectasis noted bilaterally. Marked T7 compression fracture noted as seen on prior CT scan. Hepatobiliary: No focal abnormality in  the liver on this study without intravenous contrast. Gallbladder is distended and there is subtle pericholecystic edema. No intrahepatic or extrahepatic biliary dilation. Pancreas: No focal mass lesion. No dilatation of the main duct. No intraparenchymal cyst. No peripancreatic edema. Spleen: No splenomegaly. No focal mass lesion. Adrenals/Urinary Tract: No adrenal nodule or mass. Tiny 2 mm nonobstructing stones identified interpolar right kidney. No left renal stones. No evidence for hydroureter. The urinary bladder appears normal for the degree of distention. Stomach/Bowel: Stomach is nondistended. No gastric wall thickening. No evidence of outlet obstruction. Duodenum is normally positioned as is the ligament of Treitz. No small bowel wall thickening. No small bowel dilatation. The terminal ileum is normal. The appendix is normal. No gross colonic mass. No colonic wall thickening. No substantial diverticular change. Vascular/Lymphatic: There is abdominal aortic atherosclerosis without aneurysm. There is no gastrohepatic or hepatoduodenal ligament lymphadenopathy. No intraperitoneal or retroperitoneal lymphadenopathy. No pelvic sidewall lymphadenopathy. Reproductive: The prostate gland and seminal vesicles have normal imaging features. Other: No intraperitoneal free fluid. Musculoskeletal: 4.6 x 4.6 cm lesion identified in the inferior aspect of the right gluteus musculature (see image 100 series 4). T7 compression fracture again noted. IMPRESSION: 1. Nonobstructing tiny right renal stones. No secondary changes in either kidney or ureter. 2. 4.6 cm mass lesion inferior right gluteus musculature similar to prior. This is probably a hematoma and is likely amenable to clinical inspection. 3.  Aortic Atherosclerois (ICD10-170.0) 4. T7 compression fracture as evaluated on prior thoracic spine CT. Electronically Signed: By: Misty Stanley M.D. On: 04/15/2017 14:14   US Abdomen Limited Ruq  Result Date:  04/15/2017 CLINICAL DATA:  Abdominal pain. EXAM: ULTRASOUND ABDOMEN LIMITED RIGHT  UPPER QUADRANT COMPARISON:  None. FINDINGS: Gallbladder: No evidence for gallstones. Gallbladder wall thickness is increased measuring nearly 4 mm. No evidence for pericholecystic fluid. Layering sludge evident within the lumen. Sonographer reports no sonographic Murphy sign. Common bile duct: Diameter: 5 mm.  Nondilated. Liver: Coarsening of the echotexture suggests fatty deposition. No focal abnormality. No intrahepatic biliary duct dilatation. Portal vein is patent on color Doppler imaging with normal direction of blood flow towards the liver. IMPRESSION: Although no gallstones are evident, the gallbladder wall is mildly thickened at 4 mm. No pericholecystic fluid in the sonographer reports no sonographic Murphy sign. Acalculous cholecystitis could be considered in the appropriate clinical setting. If there is concern for cystic duct obstruction, nuclear scintigraphy may prove helpful to further evaluate. Electronically Signed   By: Misty Stanley M.D.   On: 04/15/2017 15:08    EKG: Independently reviewed.  Assessment/Plan Active Problems:   UTI (urinary tract infection)   Closed wedge compression fracture of T7 vertebra (HCC)   Anemia   AF (paroxysmal atrial fibrillation) (HCC)   DM type 2 causing CKD stage 3 ([facility])   CKD (chronic kidney disease) stage 3, GFR 30-59 ml/min   Acute metabolic encephalopathy   GERD (gastroesophageal reflux disease)   Acute kidney injury superimposed on chronic kidney disease ([place])   UTI suggested by UA +large  leukocytes in urine. History of recurrent UTI, last 02/2017  WBC is 13  .Lactic 1.26  Received 2L  IVF and Rocephin in the ED  . CT abdomen neg for acute findings    F/u urine culture Ceftriaxone IV  Follow CBC in am  IVF at  75 cc/h  due to low GFR and having had received significant amount of fluid to date during this admission   Acute on Chronic kidney disease stage 4       baseline creatinine 1.3     Current Cr 4.18 CT renal abdomen tiny R renal stones  Lab Results  Component Value Date   CREATININE 4.18 (H) 04/15/2017   CREATININE 1.18 03/07/2017   CREATININE 1.30 (H) 03/06/2017  IVF Hold diuretics and ACE I  Repeat CMET in am  Acute hyperkalemia  Initially at 6.8, now 5.8. No CP, EKG Afib without changes t Recheck BMET  Repeat EKG as indicated  Telemetry Hold ACE I     Type II Diabetes Current blood sugar level is 101 No results found for: HGBA1C Hgb A1C Hold home oral diabetic medications.   SSI   Hypertension BP 114/92 Pulse 100    Continue home anti-hypertensive medications in am due to above.  Add Hydralazine Q6 hours as needed for BP 160/90   Hyperlipidemia Continue home statins  Atrial Fibrillation   on anticoagulation with Eliquis  EKG Atrial fibrillation  No significant change was found . Last 2 D echo 11/2016 EF 50-55, mod LVH  Continue Elquis and other rate control meds in am    Depression Continue home  Enablex and Zoloft  Anemia of chronic disease Hemoglobin on admission 11  Repeat CBC in am  No transfusion is indicated at this time   Chronic back pain due to compression fracture, currently at a Rehab facility OT/PT  Pain control    DVT prophylaxis:  ELiquis  Code Status:     Family Communication:  Discussed with patient Disposition Plan: Expect patient to be discharged to home after condition improves Consults called:     Admission status:    Rondel Jumbo, PA-C [facility]  04/15/2017, 3:17 PM

## 2017-04-15 NOTE — ED Notes (Signed)
CBG: 80 

## 2017-04-15 NOTE — ED Provider Notes (Signed)
[facility] DEPT Provider Note   CSN: [id number] Arrival date & time: 04/15/17  1116     History   Chief Complaint Chief Complaint  Patient presents with  . Abnormal Lab    HPI Alex Bowman is a 71 y.o. male.  HPI Patient is a 71 year old male with a history of diabetes, hypertension, chronic renal insufficiency presents the emergency department from his nursing home secondary to an elevated potassium of 6.9 white blood cell count of 29,000.  These labs were obtained yesterday and were obtained because the facility was concerned about some mild confusion and increasing generalized weakness.  No dysuria but spouse is concerned about the possibility of UTI given foul-smelling urine.  He originally was placed at the rehabilitation facility after compression fracture.  He is nonambulatory at baseline since his injury.  He has continued to be extremely deconditioned per his spouse.patient denies chest pain shortness breath.  No productive cough.   Past Medical History:  Diagnosis Date  . Arthritis   . Bilateral lower extremity edema    Chronic, with venous insufficiency  . Cataract    beginning  . Chronic kidney disease    kidney stones  . Complication of anesthesia    stayed awake during colonoscopy  . Diabetes mellitus without complication ([place])    "on medicine, but numbers have been good for years since weight loss"  . Dyslipidemia    Monitored by Primary Physician. On statin  . GERD (gastroesophageal reflux disease)   . Hypertension   . Knee pain    R>L  . Morbid obesity with BMI of 45.0-49.9, adult ([place])    Attempted weight loss efforts at last visit had BMI down to 42, unfortunately back up to 46 with weight going up to 312 lb from 285 lb  . Pneumonia 20 years ago   h/o    Patient Active Problem List   Diagnosis Date Noted  . Sepsis ([place]) 03/04/2017  . Fall 03/04/2017  . Depression 03/04/2017  . Acute metabolic encephalopathy 71/01/2693  . Hypotension   . AF  (paroxysmal atrial fibrillation) ([place]) 02/24/2017  . DM type 2 causing CKD stage 3 ([place]) 02/24/2017  . CKD (chronic kidney disease) stage 3, GFR 30-59 ml/min 02/24/2017  . Closed wedge compression fracture of T7 vertebra ([place]) 02/22/2017  . Anemia 02/22/2017  . Severe obesity (BMI >= 40) ([place]) 03/13/2013  . Dyslipidemia   . Hypertension   . Bilateral lower extremity edema   . Metabolic syndrome 85/46/2703    Past Surgical History:  Procedure Laterality Date  . 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 Mild elevation in LVDP, EF 55%. --> False-positive stress test  . CARDIOVERSION N/A 11/27/2016   Procedure: CARDIOVERSION;  Surgeon: Skeet Latch, MD;  Location: [facility];  Service: Cardiovascular;  Laterality: N/A;  . COLONOSCOPY    . CYSTOSCOPY WITH STENT PLACEMENT Left 04/19/2013   Procedure: CYSTOSCOPY WITH STENT PLACEMENT;  Surgeon: Alexis Frock, MD;  Location: [facility];  Service: Urology;  Laterality: Left;  RIGHT RETROGRADE PYELOGRAM  . CYSTOSCOPY WITH URETEROSCOPY Left 04/21/2013   Procedure: CYSTOSCOPY WITH URETEROSCOPY bilateral retrograde, bilateral stent change, stone extraction;  Surgeon: Alexis Frock, MD;  Location: [facility];  Service: Urology;  Laterality: Left;  bilateral ureters  . HAND SURGERY Bilateral 15 years ago  . HOLMIUM LASER APPLICATION Right 5/00/9381   Procedure: HOLMIUM LASER APPLICATION;  Surgeon: Alexis Frock, MD;  Location: [facility];  Service: Urology;  Laterality: Right;  . NEPHROLITHOTOMY Right 04/19/2013   Procedure: NEPHROLITHOTOMY PERCUTANEOUS;  Surgeon: Alexis Frock, MD;  Location: [facility];  Service: Urology;  Laterality: Right;  . NEPHROLITHOTOMY Right 04/21/2013   Procedure: NEPHROLITHOTOMY PERCUTANEOUS RIGHT 2ND STAGE PERCUTANEOUS NEPHROLITHOTOMY;  Surgeon: Alexis Frock, MD;  Location: [facility];  Service: Urology;  Laterality: Right;  . NM MYOVIEW LTD; [place]  May 2012   Suggested as high risk with  3 times a day 1.2 to, mild to moderate 3 times a day affecting basolateral and mid lateral walls.  [name] POLYPECTOMY         Home Medications    Prior to Admission medications   Medication Sig Start Date End Date Taking? Authorizing Provider  acetaminophen (TYLENOL) 325 MG tablet Take 2 tablets (650 mg total) by mouth every 4 (four) hours as needed for mild pain or moderate pain (or Fever >/= 101). 03/07/17   Hongalgi, Lenis Dickinson, MD  apixaban (ELIQUIS) 5 MG TABS tablet Take 1 tablet (5 mg total) by mouth 2 (two) times daily. 10/30/16   Sherran Needs, NP  atorvastatin (LIPITOR) 40 MG tablet Take 40 mg by mouth every morning.    [provider]  bisacodyl (DULCOLAX) 10 MG suppository Place 1 suppository (10 mg total) rectally as needed for moderate constipation. 03/07/17   Hongalgi, Lenis Dickinson, MD  cholecalciferol (VITAMIN D) 1000 UNITS tablet Take 1,000 Units by mouth daily.    [provider]  furosemide (LASIX) 20 MG tablet Take 20 mg by mouth daily.    [provider]  lidocaine (LIDODERM) 5 % Place 1 patch onto the skin daily. TO BE APPLIED TO PAINFUL AREA ON THE BACK/Remove & Discard patch within 12 hours or as directed by MD    [provider]  lisinopril (PRINIVIL,ZESTRIL) 20 MG tablet Take 0.5 tablets (10 mg total) by mouth daily. 03/07/17   Hongalgi, Lenis Dickinson, MD  metFORMIN (GLUCOPHAGE) 500 MG tablet Take 500 mg by mouth 2 (two) times daily with a meal.    [provider]  metoprolol succinate (TOPROL-XL) 50 MG 24 hr tablet Take 75mg  (1 and 1/2 tablets) twice a day by mouth Patient taking differently: Take 75 mg by mouth 2 (two) times daily.  12/04/16   Sherran Needs, NP  Multiple Vitamin (MULTIVITAMIN WITH MINERALS) TABS tablet Take 1 tablet by mouth daily.    [provider]  polyethylene glycol (MIRALAX / GLYCOLAX) packet Take 17 g by mouth 2 (two) times daily. Hold for diarrhae 03/07/17   Hongalgi, Everlene Farrier D, MD  potassium chloride  (MICRO-K) 10 MEQ CR capsule Take 10 mEq by mouth daily.    [provider]  senna (SENOKOT) 8.6 MG TABS tablet Take 2 tablets (17.2 mg total) by mouth at bedtime. 02/24/17   Debbe Odea, MD  sertraline (ZOLOFT) 50 MG tablet Take 50 mg by mouth daily.    [provider]  solifenacin (VESICARE) 5 MG tablet Take 5 mg by mouth daily.    [provider]    Family History Family History  Problem Relation Age of Onset  . Breast cancer Mother   . Prostate cancer Father   . Colon cancer Neg Hx     Social History Social History  Substance Use Topics  . Smoking status: Former Smoker    Packs/day: 1.00    Years: 30.00    Types: Cigarettes    Quit date: 03/02/1997  . Smokeless tobacco: Former Systems developer    Types: Loss adjuster, chartered  Quit date: 08/12/2002  . Alcohol use No     Allergies   Patient has no known allergies.   Review of Systems Review of Systems  All other systems reviewed and are negative.    Physical Exam Updated Vital Signs BP 98/66 (BP Location: Left Arm)   Pulse (!) 119   Temp (!) 97.5 F (36.4 C) (Oral)   Resp 16   Ht 5\' 10"  (1.778 m)   Wt (!) 140.6 kg (310 lb)   SpO2 98%   BMI 44.48 kg/m   Physical Exam  Constitutional: He is oriented to person, place, and time.  HENT:  Head: Normocephalic and atraumatic.  Eyes: EOM are normal.  Neck: Normal range of motion.  Cardiovascular: Intact distal pulses.   Tachycardiac.  Irregularly irregular.  Pulmonary/Chest: Effort normal and breath sounds normal. No respiratory distress.  Abdominal: He exhibits no distension. There is no tenderness.  Musculoskeletal: Normal range of motion.  Neurological: He is alert and oriented to person, place, and time.  Skin: Skin is warm and dry.  Nursing note and vitals reviewed.    ED Treatments / Results  Labs (all labs ordered are listed, but only abnormal results are displayed) Labs Reviewed  COMPREHENSIVE METABOLIC PANEL - Abnormal; Notable for the  following:       Result Value   Sodium 133 (*)    Potassium 5.8 (*)    Chloride 98 (*)    Glucose, Bld 101 (*)    BUN 92 (*)    Creatinine, Ser 4.18 (*)    Albumin 2.5 (*)    AST 51 (*)    Alkaline Phosphatase 138 (*)    GFR calc non Af Amer 13 (*)    GFR calc Af Amer 15 (*)    All other components within normal limits  CBC WITH DIFFERENTIAL/PLATELET - Abnormal; Notable for the following:    WBC 13.0 (*)    RBC 4.07 (*)    Hemoglobin 11.0 (*)    HCT 35.9 (*)    RDW 15.6 (*)    Neutro Abs 9.0 (*)    All other components within normal limits  URINALYSIS, ROUTINE W REFLEX MICROSCOPIC - Abnormal; Notable for the following:    Color, Urine AMBER (*)    APPearance HAZY (*)    Protein, ur 100 (*)    Leukocytes, UA LARGE (*)    Bacteria, UA MANY (*)    Squamous Epithelial / LPF 0-5 (*)    Non Squamous Epithelial 0-5 (*)    All other components within normal limits  URINE CULTURE  LIPASE, BLOOD  I-STAT CG4 LACTIC ACID, ED  I-STAT CG4 LACTIC ACID, ED   BUN  Date Value Ref Range Status  04/15/2017 92 (H) 6 - 20 mg/dL Final  03/07/2017 16 6 - 20 mg/dL Final  03/06/2017 24 (H) 6 - 20 mg/dL Final  03/05/2017 31 (H) 6 - 20 mg/dL Final   Creatinine, Ser  Date Value Ref Range Status  04/15/2017 4.18 (H) 0.61 - 1.24 mg/dL Final  03/07/2017 1.18 0.61 - 1.24 mg/dL Final  03/06/2017 1.30 (H) 0.61 - 1.24 mg/dL Final  03/05/2017 1.36 (H) 0.61 - 1.24 mg/dL Final     EKG  EKG Interpretation  Date/Time:  Tuesday April 15 2017 11:28:11 EDT Ventricular Rate:  113 PR Interval:    QRS Duration: 103 QT Interval:  362 QTC Calculation: 497 R Axis:   56 Text Interpretation:  Atrial fibrillation Borderline prolonged QT interval No significant change  was found Confirmed by Jola Schmidt [phone]) on 04/15/2017 12:49:06 PM       Radiology Dg Chest Portable 1 View  Result Date: 04/15/2017 CLINICAL DATA:  Weakness, chronic back pain EXAM: PORTABLE CHEST 1 VIEW COMPARISON:  03/04/2017  FINDINGS: The heart size and mediastinal contours are within normal limits. Both lungs are clear. The visualized skeletal structures are unremarkable. IMPRESSION: No active disease. Electronically Signed   By: Kathreen Devoid   On: 04/15/2017 12:27   Ct Renal Stone Study  Addendum Date: 04/15/2017   ADDENDUM REPORT: 04/15/2017 14:23 ADDENDUM: Distended gallbladder with subtle pericholecystic edema raises the question of cholecystitis. Abdominal ultrasound may prove helpful to further evaluate. I personally discussed this addendum with Dr. Venora Maples at 1423 hours on 04/15/2017. Electronically Signed   By: Misty Stanley M.D.   On: 04/15/2017 14:23   Result Date: 04/15/2017 CLINICAL DATA:  Bilateral low back pain. EXAM: CT ABDOMEN AND PELVIS WITHOUT CONTRAST TECHNIQUE: Multidetector CT imaging of the abdomen and pelvis was performed following the standard protocol without IV contrast. COMPARISON:  03/04/2017 FINDINGS: Lower chest: Dependent atelectasis noted bilaterally. Marked T7 compression fracture noted as seen on prior CT scan. Hepatobiliary: No focal abnormality in the liver on this study without intravenous contrast. Gallbladder is distended and there is subtle pericholecystic edema. No intrahepatic or extrahepatic biliary dilation. Pancreas: No focal mass lesion. No dilatation of the main duct. No intraparenchymal cyst. No peripancreatic edema. Spleen: No splenomegaly. No focal mass lesion. Adrenals/Urinary Tract: No adrenal nodule or mass. Tiny 2 mm nonobstructing stones identified interpolar right kidney. No left renal stones. No evidence for hydroureter. The urinary bladder appears normal for the degree of distention. Stomach/Bowel: Stomach is nondistended. No gastric wall thickening. No evidence of outlet obstruction. Duodenum is normally positioned as is the ligament of Treitz. No small bowel wall thickening. No small bowel dilatation. The terminal ileum is normal. The appendix is normal. No gross colonic  mass. No colonic wall thickening. No substantial diverticular change. Vascular/Lymphatic: There is abdominal aortic atherosclerosis without aneurysm. There is no gastrohepatic or hepatoduodenal ligament lymphadenopathy. No intraperitoneal or retroperitoneal lymphadenopathy. No pelvic sidewall lymphadenopathy. Reproductive: The prostate gland and seminal vesicles have normal imaging features. Other: No intraperitoneal free fluid. Musculoskeletal: 4.6 x 4.6 cm lesion identified in the inferior aspect of the right gluteus musculature (see image 100 series 4). T7 compression fracture again noted. IMPRESSION: 1. Nonobstructing tiny right renal stones. No secondary changes in either kidney or ureter. 2. 4.6 cm mass lesion inferior right gluteus musculature similar to prior. This is probably a hematoma and is likely amenable to clinical inspection. 3.  Aortic Atherosclerois (ICD10-170.0) 4. T7 compression fracture as evaluated on prior thoracic spine CT. Electronically Signed: By: Misty Stanley M.D. On: 04/15/2017 14:14    Procedures Procedures (including critical care time)  Medications Ordered in ED Medications  cefTRIAXone (ROCEPHIN) 1 g in dextrose 5 % 50 mL IVPB (1 g Intravenous New Bag/Given 04/15/17 1412)  sodium chloride 0.9 % bolus 1,000 mL (not administered)  sodium chloride 0.9 % bolus 1,000 mL (0 mLs Intravenous Stopped 04/15/17 1412)     Initial Impression / Assessment and Plan / ED Course  I have reviewed the triage vital signs and the nursing notes.  Pertinent labs & imaging results that were available during my care of the patient were reviewed by me and considered in my medical decision making (see chart for details).     Patient be admitted the hospital for acute kidney injury  in the setting of urinary tract infection.  Urine culture sent.  Rocephin given.  Patient be admitted the hospital for ongoing workup.  CT stone study demonstrates no noted cause of his uropathy.  He does have  subtle swelling around his gallbladder but the patient is not focally tender at this place.  We will obtain an ultrasound of the right upper quadrant for further delineation. Patient family updated.  Patient will continue to be monitored secondary to his hyperkalemia.  Final Clinical Impressions(s) / ED Diagnoses   Final diagnoses:  Acute kidney injury ([place])  Acute cystitis without hematuria  Abdominal pain    New Prescriptions New Prescriptions   No medications on file     Jola Schmidt, MD 04/15/17 1442

## 2017-04-15 NOTE — ED Notes (Signed)
Pt's spouse at bedside reports pt "like himself" at this time, reports pt altered yesterday. Pt's spouse reports hx confusion with pain meds, also reports pt had "urine smell" yesterday, hx UTI.

## 2017-04-16 ENCOUNTER — Observation Stay (HOSPITAL_COMMUNITY): Payer: Medicare Other

## 2017-04-16 ENCOUNTER — Encounter (HOSPITAL_COMMUNITY): Payer: Self-pay | Admitting: General Practice

## 2017-04-16 DIAGNOSIS — I10 Essential (primary) hypertension: Secondary | ICD-10-CM | POA: Diagnosis not present

## 2017-04-16 DIAGNOSIS — E1122 Type 2 diabetes mellitus with diabetic chronic kidney disease: Secondary | ICD-10-CM | POA: Diagnosis not present

## 2017-04-16 DIAGNOSIS — G9341 Metabolic encephalopathy: Secondary | ICD-10-CM | POA: Diagnosis not present

## 2017-04-16 DIAGNOSIS — N183 Chronic kidney disease, stage 3 (moderate): Secondary | ICD-10-CM | POA: Diagnosis not present

## 2017-04-16 DIAGNOSIS — N3000 Acute cystitis without hematuria: Secondary | ICD-10-CM | POA: Diagnosis not present

## 2017-04-16 DIAGNOSIS — N179 Acute kidney failure, unspecified: Secondary | ICD-10-CM

## 2017-04-16 DIAGNOSIS — I480 Paroxysmal atrial fibrillation: Secondary | ICD-10-CM | POA: Diagnosis not present

## 2017-04-16 DIAGNOSIS — L8990 Pressure ulcer of unspecified site, unspecified stage: Secondary | ICD-10-CM | POA: Insufficient documentation

## 2017-04-16 DIAGNOSIS — A419 Sepsis, unspecified organism: Secondary | ICD-10-CM | POA: Diagnosis not present

## 2017-04-16 LAB — GLUCOSE, CAPILLARY
GLUCOSE-CAPILLARY: 83 mg/dL (ref 65–99)
GLUCOSE-CAPILLARY: 95 mg/dL (ref 65–99)
Glucose-Capillary: 111 mg/dL — ABNORMAL HIGH (ref 65–99)
Glucose-Capillary: 86 mg/dL (ref 65–99)

## 2017-04-16 LAB — COMPREHENSIVE METABOLIC PANEL
ALBUMIN: 2.3 g/dL — AB (ref 3.5–5.0)
ALK PHOS: 118 U/L (ref 38–126)
ALT: 31 U/L (ref 17–63)
AST: 35 U/L (ref 15–41)
Anion gap: 13 (ref 5–15)
BILIRUBIN TOTAL: 0.7 mg/dL (ref 0.3–1.2)
BUN: 83 mg/dL — AB (ref 6–20)
CALCIUM: 8.2 mg/dL — AB (ref 8.9–10.3)
CO2: 19 mmol/L — ABNORMAL LOW (ref 22–32)
CREATININE: 3.54 mg/dL — AB (ref 0.61–1.24)
Chloride: 104 mmol/L (ref 101–111)
GFR calc Af Amer: 19 mL/min — ABNORMAL LOW (ref >60)
GFR calc non Af Amer: 16 mL/min — ABNORMAL LOW (ref >60)
GLUCOSE: 89 mg/dL (ref 65–99)
Potassium: 5.4 mmol/L — ABNORMAL HIGH (ref 3.5–5.1)
Sodium: 136 mmol/L (ref 135–145)
TOTAL PROTEIN: 6.4 g/dL — AB (ref 6.5–8.1)

## 2017-04-16 LAB — CBC
HCT: 33.8 % — ABNORMAL LOW (ref 39.0–52.0)
Hemoglobin: 10.2 g/dL — ABNORMAL LOW (ref 13.0–17.0)
MCH: 26.8 pg (ref 26.0–34.0)
MCHC: 30.2 g/dL (ref 30.0–36.0)
MCV: 88.7 fL (ref 78.0–100.0)
Platelets: 226 10*3/uL (ref 150–400)
RBC: 3.81 MIL/uL — ABNORMAL LOW (ref 4.22–5.81)
RDW: 15.7 % — AB (ref 11.5–15.5)
WBC: 11.1 10*3/uL — ABNORMAL HIGH (ref 4.0–10.5)

## 2017-04-16 LAB — MRSA PCR SCREENING: MRSA BY PCR: NEGATIVE

## 2017-04-16 MED ORDER — MORPHINE SULFATE (PF) 2 MG/ML IV SOLN
2.0000 mg | Freq: Once | INTRAVENOUS | Status: DC
  Filled 2017-04-16: qty 1

## 2017-04-16 MED ORDER — FUROSEMIDE 10 MG/ML IJ SOLN
20.0000 mg | Freq: Once | INTRAMUSCULAR | Status: AC
  Administered 2017-04-16: 20 mg via INTRAVENOUS
  Filled 2017-04-16: qty 2

## 2017-04-16 MED ORDER — METOPROLOL TARTRATE 5 MG/5ML IV SOLN
5.0000 mg | Freq: Once | INTRAVENOUS | Status: AC
  Administered 2017-04-17: 2.5 mg via INTRAVENOUS
  Filled 2017-04-16: qty 5

## 2017-04-16 NOTE — Evaluation (Signed)
Physical Therapy Evaluation Patient Details Name: Alex Bowman MRN: [id number] DOB: April 04, 1946 Today's Date: 04/16/2017   History of Present Illness  Pt is a 71 y/o male admitted from [facility] secondary to abnormal lab values. PMH includes CKD, DM, HTN, and T7 compression fx which is being managed conservatively.   Clinical Impression  Pt presenting with problem above and deficits below. Pt with decreased cognition therefore unable to obtain much about PLOF. Per notes, pt from [facility] secondary to compression fracture and per notes, pt seemed to be mostly bed bound since admission. No family/caregiver to confirm. Per previous PT notes from July, looks like pt was walking with +2 assist for short distances. Upon eval, pt very limited by pain and only agreeable to rolling L/R for changing soiled linen. Required total assist for rolling. Recommending return to SNF at d/c to increase independence with functional mobility. Will continue to follow acutely.     Follow Up Recommendations SNF    Equipment Recommendations  None recommended by PT    Recommendations for Other Services       Precautions / Restrictions Precautions Precautions: Back;Fall Precaution Booklet Issued: No Precaution Comments: Pt required cues for all 3 back precautions.  Required Braces or Orthoses: Spinal Brace Spinal Brace: Thoracolumbosacral orthotic;Applied in sitting position Restrictions Weight Bearing Restrictions: No      Mobility  Bed Mobility Overal bed mobility: Needs Assistance Bed Mobility: Rolling Rolling: Total assist         General bed mobility comments: Total assist to roll L and R for changing soiled linens. Verbal cues for hand placement and using LEs to assist with rolling. Pt with slowed motor processing. Pt grimacing and crying out therefore mobility limited to rolling.   Transfers                 General transfer comment: NT secondary to increased  pain  Ambulation/Gait                Stairs            Wheelchair Mobility    Modified Rankin (Stroke Patients Only)       Balance                                             Pertinent Vitals/Pain Pain Assessment: Faces Faces Pain Scale: Hurts whole lot Pain Location: back  Pain Descriptors / Indicators: Grimacing;Moaning Pain Intervention(s): Monitored during session;Limited activity within patient's tolerance;Repositioned    Home Living Family/patient expects to be discharged to:: [place]: Other (Comment) ([place])               Additional Comments: [place] per notes     Prior Function Level of Independence: Needs assistance   Gait / Transfers Assistance Needed: Per notes, pt has been mostly bed bound while at [place] [facility]. Pt unable to report given confusion.            Hand Dominance   Dominant Hand: Right    Extremity/Trunk Assessment   Upper Extremity Assessment Upper Extremity Assessment: Defer to OT evaluation    Lower Extremity Assessment Lower Extremity Assessment: Generalized weakness    Cervical / Trunk Assessment Cervical / Trunk Assessment: Other exceptions Cervical / Trunk Exceptions: T7 compression fx  Communication   Communication: HOH  Cognition Arousal/Alertness: Awake/alert Behavior During Therapy:  WFL for tasks assessed/performed Overall Cognitive Status: No family/caregiver present to determine baseline cognitive functioning                                 General Comments: Pt unable to answer questions about PLOF and presenting with slowed motor processing. Unsure if pt at baseline, as no family present.       General Comments General comments (skin integrity, edema, etc.): No family present during session.     Exercises     Assessment/Plan    PT Assessment Patient needs continued PT services  PT Problem List Decreased  strength;Decreased activity tolerance;Decreased balance;Decreased mobility;Decreased cognition;Decreased knowledge of use of DME;Decreased safety awareness;Decreased knowledge of precautions;Pain       PT Treatment Interventions Gait training;DME instruction;Functional mobility training;Therapeutic activities;Therapeutic exercise;Balance training;Neuromuscular re-education;Patient/family education    PT Goals (Current goals can be found in the Care Plan section)  Acute Rehab PT Goals Patient Stated Goal: none stated PT Goal Formulation: Patient unable to participate in goal setting Time For Goal Achievement: 04/30/17 Potential to Achieve Goals: Fair    Frequency Min 2X/week   Barriers to discharge        Co-evaluation               AM-PAC PT "6 Clicks" Daily Activity  Outcome Measure Difficulty turning over in bed (including adjusting bedclothes, sheets and blankets)?: Unable Difficulty moving from lying on back to sitting on the side of the bed? : Unable Difficulty sitting down on and standing up from a chair with arms (e.g., wheelchair, bedside commode, etc,.)?: Unable Help needed moving to and from a bed to chair (including a wheelchair)?: Total Help needed walking in hospital room?: Total Help needed climbing 3-5 steps with a railing? : Total 6 Click Score: 6    End of Session   Activity Tolerance: Patient limited by pain Patient left: in bed;with call bell/phone within reach;with bed alarm set Nurse Communication: Mobility status PT Visit Diagnosis: Difficulty in walking, not elsewhere classified (R26.2);Pain Pain - part of body:  (back )    Time: 2836-6294 PT Time Calculation (min) (ACUTE ONLY): 16 min   Charges:   PT Evaluation $PT Eval Moderate Complexity: 1 Mod     PT G Codes:   PT G-Codes **NOT FOR INPATIENT CLASS** Functional Assessment Tool Used: Clinical judgement;AM-PAC 6 Clicks Basic Mobility Functional Limitation: Mobility: Walking and moving  around Mobility: Walking and Moving Around Current Status (T6546): 100 percent impaired, limited or restricted Mobility: Walking and Moving Around Goal Status (T0354): At least 60 percent but less than 80 percent impaired, limited or restricted    Leighton Ruff, PT, DPT  Acute Rehabilitation Services  Pager: [place] 04/16/2017, 12:56 PM

## 2017-04-16 NOTE — Progress Notes (Signed)
PROGRESS NOTE [facility]   Alex Bowman   LNL:[id number] DOB: May 22, 1946  DOA: 04/15/2017 PCP: [name], MD   Brief Narrative:  Alex Bowman is a 71 year old male with past medical history significant for CKD stage IV, hypertension, diabetes mellitus type 2, chronic back pain who presented to the emergency department after being sent by his rehabilitation facility with elevated potassium of 6.9 and a white blood cell count of 29,000. Labs were obtained that they prior to admission as patient didn't rehabilitation show mild confusion and increasing weakness. ED evaluation patient was found to have possible UTI, acute kidney injury and was admitted for IV antibiotics and IV hydration.   Subjective: Patient seen and examined, reports no complaints at this time. Patient afebrile, no acute events overnight. Although on speaking with patient patient seems to be short of breath, patient denies.  Assessment & Plan: UTI UA grossly abnormal Urine culture growing Klebsiella pneumoniae, pending sensitivities Continue ceftriaxone for now  Acute on chronic kidney disease stage IV Improve with IV fluids although on exam this morning sounds with some crackles and patient was slight short of breath. Will hold IV fluids for now. Encourage oral hydration. Monitor creatinine in the morning. IV Lasix were given.  Hyperkalemia Treated with IV Lasix and IV fluid Repeat BMP in the morning  Type 2 diabetes mellitus   On oral metformin at home, which was held due to renal failure Monitor CBGs SSI A1c 5.4 well controlled  Hypertension Blood pressure seems to be stable during hospital stay Patient on metoprolol  A. Fib Heart rate well controlled On Eliquis for anticoagulation Continue BB   Chronic back pain due to compression fracture  PT  Pain control as needed   DVT prophylaxis: Eliquis  Code Status: Partial  Family Communication: None at bedside  Disposition Plan: Home in next  24-48 hrs    Consultants:   none  Procedures:   none  Antimicrobials: Anti-infectives    Start     Dose/Rate Route Frequency Ordered Stop   04/16/17 1400  cefTRIAXone (ROCEPHIN) 1 g in dextrose 5 % 50 mL IVPB     1 g 100 mL/hr over 30 Minutes Intravenous Bowman 24 hours 04/15/17 1627     04/15/17 1300  cefTRIAXone (ROCEPHIN) 1 g in dextrose 5 % 50 mL IVPB     1 g 100 mL/hr over 30 Minutes Intravenous  Once 04/15/17 1246 04/15/17 1442      Objective: Vitals:   04/15/17 2112 04/16/17 0555 04/16/17 0940 04/16/17 1455  BP: 95/74 95/63 115/75 99/68  Pulse: 67 65 (!) 104 63  Resp: 19 20  17   Temp: (!) 97.4 F (36.3 C) 98.1 F (36.7 C)  98 F (36.7 C)  TempSrc: Oral Oral    SpO2: 93% 93%  94%  Weight:      Height:        Intake/Output Summary (Last 24 hours) at 04/16/17 1656 Last data filed at 04/16/17 1521  Gross per 24 hour  Intake           958.75 ml  Output                0 ml  Net           958.75 ml   Filed Weights   04/15/17 1129 04/15/17 1835  Weight: (!) 140.6 kg (310 lb) 130.4 kg (287 lb 8 oz)    Examination:  General exam:  NAD HEENT: OP moist and clear Respiratory  system: decreased breath sound at bilateral bases, bibasilar crackles, tachypneic Cardiovascular system: S1 & S2 heard, RRR. 2/6 systolic murmur Gastrointestinal system: Abdomen is nondistended, soft and nontender.  Central nervous system: Alert and oriented. No focal neurological deficits. Extremities: 1+ lower extremity pitting edema  Skin: No rashes Psychiatry: Mood & affect appropriate.    Data Reviewed: I have personally reviewed following labs and imaging studies  CBC:  Recent Labs Lab 04/15/17 1148 04/16/17 0617  WBC 13.0* 11.1*  NEUTROABS 9.0*  --   HGB 11.0* 10.2*  HCT 35.9* 33.8*  MCV 88.2 88.7  PLT 209 096   Basic Metabolic Panel:  Recent Labs Lab 04/15/17 1148 04/16/17 0617  NA 133* 136  K 5.8* 5.4*  CL 98* 104  CO2 22 19*  GLUCOSE 101* 89  BUN 92* 83*   CREATININE 4.18* 3.54*  CALCIUM 8.9 8.2*   GFR: Estimated Creatinine Clearance: 26 mL/min (A) (by C-G formula based on SCr of 3.54 mg/dL (H)). Liver Function Tests:  Recent Labs Lab 04/15/17 1148 04/16/17 0617  AST 51* 35  ALT 39 31  ALKPHOS 138* 118  BILITOT 0.5 0.7  PROT 7.0 6.4*  ALBUMIN 2.5* 2.3*    Recent Labs Lab 04/15/17 1148  LIPASE 26   No results for input(s): AMMONIA in the last 168 hours. Coagulation Profile: No results for input(s): INR, PROTIME in the last 168 hours. Cardiac Enzymes: No results for input(s): CKTOTAL, CKMB, CKMBINDEX, TROPONINI in the last 168 hours. BNP (last 3 results) No results for input(s): PROBNP in the last 8760 hours. HbA1C:  Recent Labs  04/15/17 1637  HGBA1C 5.6   CBG:  Recent Labs Lab 04/15/17 1717 04/15/17 2144 04/16/17 0752 04/16/17 1205  GLUCAP 80 106* 83 111*   Lipid Profile: No results for input(s): CHOL, HDL, LDLCALC, TRIG, CHOLHDL, LDLDIRECT in the last 72 hours. Thyroid Function Tests: No results for input(s): TSH, T4TOTAL, FREET4, T3FREE, THYROIDAB in the last 72 hours. Anemia Panel: No results for input(s): VITAMINB12, FOLATE, FERRITIN, TIBC, IRON, RETICCTPCT in the last 72 hours. Sepsis Labs:  Recent Labs Lab 04/15/17 1213 04/15/17 1654  LATICACIDVEN 1.26 1.23    Recent Results (from the past 240 hour(s))  Urine culture     Status: Abnormal (Preliminary result)   Collection Time: 04/15/17 11:52 AM  Result Value Ref Range Status   Specimen Description URINE, RANDOM  Final   Special Requests NONE  Final   Culture >=100,000 COLONIES/mL KLEBSIELLA PNEUMONIAE (A)  Final   Report Status PENDING  Incomplete  Blood culture (routine x 2)     Status: None (Preliminary result)   Collection Time: 04/15/17  4:40 PM  Result Value Ref Range Status   Specimen Description BLOOD LEFT WRIST  Final   Special Requests   Final    BOTTLES DRAWN AEROBIC AND ANAEROBIC Blood Culture adequate volume   Culture NO  GROWTH < 24 HOURS  Final   Report Status PENDING  Incomplete  Blood culture (routine x 2)     Status: None (Preliminary result)   Collection Time: 04/15/17  4:48 PM  Result Value Ref Range Status   Specimen Description BLOOD LEFT ANTECUBITAL  Final   Special Requests   Final    BOTTLES DRAWN AEROBIC AND ANAEROBIC Blood Culture adequate volume   Culture NO GROWTH < 24 HOURS  Final   Report Status PENDING  Incomplete  MRSA PCR Screening     Status: None   Collection Time: 04/15/17  7:59 PM  Result  Value Ref Range Status   MRSA by PCR NEGATIVE NEGATIVE Final    Comment:        The GeneXpert MRSA Assay (FDA approved for NASAL specimens only), is one component of a comprehensive MRSA colonization surveillance program. It is not intended to diagnose MRSA infection nor to guide or monitor treatment for MRSA infections.       Radiology Studies: Dg Chest Portable 1 View  Result Date: 04/15/2017 CLINICAL DATA:  Weakness, chronic back pain EXAM: PORTABLE CHEST 1 VIEW COMPARISON:  03/04/2017 FINDINGS: The heart size and mediastinal contours are within normal limits. Both lungs are clear. The visualized skeletal structures are unremarkable. IMPRESSION: No active disease. Electronically Signed   By: Kathreen Devoid   On: 04/15/2017 12:27   Ct Renal Stone Study  Addendum Date: 04/15/2017   ADDENDUM REPORT: 04/15/2017 14:23 ADDENDUM: Distended gallbladder with subtle pericholecystic edema raises the question of cholecystitis. Abdominal ultrasound may prove helpful to further evaluate. I personally discussed this addendum with Dr. Venora Maples at 1423 hours on 04/15/2017. Electronically Signed   By: Misty Stanley M.D.   On: 04/15/2017 14:23   Result Date: 04/15/2017 CLINICAL DATA:  Bilateral low back pain. EXAM: CT ABDOMEN AND PELVIS WITHOUT CONTRAST TECHNIQUE: Multidetector CT imaging of the abdomen and pelvis was performed following the standard protocol without IV contrast. COMPARISON:  03/04/2017  FINDINGS: Lower chest: Dependent atelectasis noted bilaterally. Marked T7 compression fracture noted as seen on prior CT scan. Hepatobiliary: No focal abnormality in the liver on this study without intravenous contrast. Gallbladder is distended and there is subtle pericholecystic edema. No intrahepatic or extrahepatic biliary dilation. Pancreas: No focal mass lesion. No dilatation of the main duct. No intraparenchymal cyst. No peripancreatic edema. Spleen: No splenomegaly. No focal mass lesion. Adrenals/Urinary Tract: No adrenal nodule or mass. Tiny 2 mm nonobstructing stones identified interpolar right kidney. No left renal stones. No evidence for hydroureter. The urinary bladder appears normal for the degree of distention. Stomach/Bowel: Stomach is nondistended. No gastric wall thickening. No evidence of outlet obstruction. Duodenum is normally positioned as is the ligament of Treitz. No small bowel wall thickening. No small bowel dilatation. The terminal ileum is normal. The appendix is normal. No gross colonic mass. No colonic wall thickening. No substantial diverticular change. Vascular/Lymphatic: There is abdominal aortic atherosclerosis without aneurysm. There is no gastrohepatic or hepatoduodenal ligament lymphadenopathy. No intraperitoneal or retroperitoneal lymphadenopathy. No pelvic sidewall lymphadenopathy. Reproductive: The prostate gland and seminal vesicles have normal imaging features. Other: No intraperitoneal free fluid. Musculoskeletal: 4.6 x 4.6 cm lesion identified in the inferior aspect of the right gluteus musculature (see image 100 series 4). T7 compression fracture again noted. IMPRESSION: 1. Nonobstructing tiny right renal stones. No secondary changes in either kidney or ureter. 2. 4.6 cm mass lesion inferior right gluteus musculature similar to prior. This is probably a hematoma and is likely amenable to clinical inspection. 3.  Aortic Atherosclerois (ICD10-170.0) 4. T7 compression  fracture as evaluated on prior thoracic spine CT. Electronically Signed: By: Misty Stanley M.D. On: 04/15/2017 14:14   US Abdomen Limited Ruq  Result Date: 04/15/2017 CLINICAL DATA:  Abdominal pain. EXAM: ULTRASOUND ABDOMEN LIMITED RIGHT UPPER QUADRANT COMPARISON:  None. FINDINGS: Gallbladder: No evidence for gallstones. Gallbladder wall thickness is increased measuring nearly 4 mm. No evidence for pericholecystic fluid. Layering sludge evident within the lumen. Sonographer reports no sonographic Murphy sign. Common bile duct: Diameter: 5 mm.  Nondilated. Liver: Coarsening of the echotexture suggests fatty deposition. No focal abnormality.  No intrahepatic biliary duct dilatation. Portal vein is patent on color Doppler imaging with normal direction of blood flow towards the liver. IMPRESSION: Although no gallstones are evident, the gallbladder wall is mildly thickened at 4 mm. No pericholecystic fluid in the sonographer reports no sonographic Murphy sign. Acalculous cholecystitis could be considered in the appropriate clinical setting. If there is concern for cystic duct obstruction, nuclear scintigraphy may prove helpful to further evaluate. Electronically Signed   By: Misty Stanley M.D.   On: 04/15/2017 15:08      Scheduled Meds: . apixaban  5 mg Oral BID  . atorvastatin  40 mg Oral q morning - 10a  . darifenacin  7.5 mg Oral Daily  . fentaNYL  25 mcg Transdermal Q72H  . furosemide  20 mg Oral Daily  . insulin aspart  0-9 Units Subcutaneous TID WC  . lidocaine  1 patch Transdermal Q24H  . lisinopril  10 mg Oral Daily  . metoprolol succinate  75 mg Oral BID  . sertraline  50 mg Oral Daily   Continuous Infusions: . cefTRIAXone (ROCEPHIN)  IV Stopped (04/16/17 1518)     LOS: 0 days    Time spent: Total of 35 minutes spent with pt, greater than 50% of which was spent in discussion of  treatment, counseling and coordination of care    Chipper Oman, MD Pager: Text Page via [email or web address]    If 7PM-7AM, please contact night-coverage [email or web address] 04/16/2017, 4:56 PM

## 2017-04-16 NOTE — Progress Notes (Addendum)
At about 11: 15pm, pt's having SOB, reported chest pain. O2 level is 85 on room air, 98 on 3L. Resp therapist called to assess. Rapid Response called to assess. EKG completed, and on file. NP on call was notified, as well as informed that pt was coughing with sips of water, so PO meds- Metoprolol and Eliquis were held per NP. NP went ahead to make pt NPO.  Pt's EKG was Afib, NP notified. Orders were put in for:  IV Metoprolol (for high HR) NPO Cardiac telemetry with continuous pulse ox Xopenox Neb Tx CXR showed Infltrates 500cc bolus (pt's BP was soft) Morphine for pain Cardizem for Afib IV Abx change to Vanc and Zosyn  RR was at bedside most of the event and NP came to the room to examine the pt at bedside. Orders completed. Will continue to monitor.

## 2017-04-16 NOTE — NC FL2 (Signed)
[place] LEVEL OF CARE SCREENING TOOL     IDENTIFICATION  Patient Name: Alex Bowman Birthdate: 1945-10-09 Sex: male Admission Date (Current Location): 04/15/2017  [facility] and [place] Number:  Herbalist and Address:  [facility] [place] [place] [address] [place], [place] [address]      Provider Number: [id number]  Attending Physician Name and Address:  Patrecia Pour, Christean Grief, MD  Relative Name and Phone Number:  Lennox Laity, spouse, [phone]    Current Level of Care: Hospital Recommended Level of Care: [place] Prior Approval Number:    Date Approved/Denied:   PASRR Number: [id number]  Discharge Plan: SNF    Current Diagnoses: Patient Active Problem List   Diagnosis Date Noted  . Pressure injury of skin 04/16/2017  . GERD (gastroesophageal reflux disease) 04/15/2017  . UTI (urinary tract infection) 04/15/2017  . Acute kidney injury superimposed on chronic kidney disease ([place]) 04/15/2017  . AKI (acute kidney injury) ([place]) 04/15/2017  . Diabetes mellitus with complication ([place])   . Sepsis ([place]) 03/04/2017  . Fall 03/04/2017  . Depression 03/04/2017  . Acute metabolic encephalopathy 31/54/0086  . Hypotension   . AF (paroxysmal atrial fibrillation) ([place]) 02/24/2017  . DM type 2 causing CKD stage 3 ([place]) 02/24/2017  . CKD (chronic kidney disease) stage 3, GFR 30-59 ml/min 02/24/2017  . Closed wedge compression fracture of T7 vertebra ([place]) 02/22/2017  . Anemia 02/22/2017  . Severe obesity (BMI >= 40) ([place]) 03/13/2013  . Dyslipidemia   . Hypertension   . Bilateral lower extremity edema   . Metabolic syndrome 76/19/5093    Orientation RESPIRATION BLADDER Height & Weight     Self  Normal Incontinent Weight: 130.4 kg (287 lb 8 oz) Height:  5\' 10"  (177.8 cm)  BEHAVIORAL SYMPTOMS/MOOD NEUROLOGICAL BOWEL NUTRITION STATUS      Continent Diet (Please see DC Summary)  AMBULATORY STATUS COMMUNICATION OF NEEDS Skin    Extensive Assist Verbally PU Stage and Appropriate Care (Stage I and II pressure injury on buttocks)                       Personal Care Assistance Level of Assistance  Bathing, Feeding, Dressing Bathing Assistance: Maximum assistance Feeding assistance: Limited assistance Dressing Assistance: Limited assistance     Functional Limitations Info  Sight Sight Info: Impaired        SPECIAL CARE FACTORS FREQUENCY  PT (By licensed PT)     PT Frequency: 5x/week              Contractures      Additional Factors Info  Code Status, Allergies Code Status Info: Partial Allergies Info: NKA           Current Medications (04/16/2017):  This is the current hospital active medication list Current Facility-Administered Medications  Medication Dose Route Frequency Provider Last Rate Last Dose  . acetaminophen (TYLENOL) tablet 650 mg  650 mg Oral Q6H PRN Sharene Butters E, PA-C       Or  . acetaminophen (TYLENOL) suppository 650 mg  650 mg Rectal Q6H PRN Rondel Jumbo, PA-C      . apixaban (ELIQUIS) tablet 5 mg  5 mg Oral BID Rondel Jumbo, PA-C   5 mg at 04/16/17 0940  . atorvastatin (LIPITOR) tablet 40 mg  40 mg Oral q morning - 10a Rondel Jumbo, PA-C   40 mg at 04/16/17 0940  . bisacodyl (DULCOLAX) suppository 10 mg  10 mg Rectal Daily PRN Rondel Jumbo, PA-C      . cefTRIAXone (ROCEPHIN) 1 g in dextrose 5 % 50 mL IVPB  1 g Intravenous Q24H Waldemar Dickens, MD      . darifenacin (ENABLEX) 24 hr tablet 7.5 mg  7.5 mg Oral Daily Rondel Jumbo, PA-C   7.5 mg at 04/16/17 0941  . fentaNYL (DURAGESIC - dosed mcg/hr) patch 25 mcg  25 mcg Transdermal Q72H Rondel Jumbo, PA-C   25 mcg at 04/16/17 2297  . furosemide (LASIX) tablet 20 mg  20 mg Oral Daily Rondel Jumbo, PA-C   20 mg at 04/16/17 0940  . hydrALAZINE (APRESOLINE) injection 5-10 mg  5-10 mg Intravenous Q8H PRN Rondel Jumbo, PA-C      . HYDROcodone-acetaminophen (NORCO/VICODIN) 5-325 MG per tablet 1-2 tablet   1-2 tablet Oral Q4H PRN Rondel Jumbo, PA-C   2 tablet at 04/15/17 2107  . insulin aspart (novoLOG) injection 0-9 Units  0-9 Units Subcutaneous TID WC Wertman, Sara E, PA-C      . lidocaine (LIDODERM) 5 % 1 patch  1 patch Transdermal Q24H Rondel Jumbo, PA-C   1 patch at 04/16/17 [phone]  . lisinopril (PRINIVIL,ZESTRIL) tablet 10 mg  10 mg Oral Daily Rondel Jumbo, PA-C   10 mg at 04/16/17 0940  . metoprolol succinate (TOPROL-XL) 24 hr tablet 75 mg  75 mg Oral BID Rondel Jumbo, PA-C   75 mg at 04/16/17 0940  . ondansetron (ZOFRAN) tablet 4 mg  4 mg Oral Q6H PRN Rondel Jumbo, PA-C       Or  . ondansetron [facility]) injection 4 mg  4 mg Intravenous Q6H PRN Rondel Jumbo, PA-C      . senna-docusate (Senokot-S) tablet 1 tablet  1 tablet Oral QHS PRN Rondel Jumbo, PA-C      . sertraline (ZOLOFT) tablet 50 mg  50 mg Oral Daily Rondel Jumbo, PA-C   50 mg at 04/16/17 0940     Discharge Medications: Please see discharge summary for a list of discharge medications.  Relevant Imaging Results:  Relevant Lab Results:   Additional Information [id number]  Benard Halsted, LCSWA

## 2017-04-17 DIAGNOSIS — M4854XA Collapsed vertebra, not elsewhere classified, thoracic region, initial encounter for fracture: Secondary | ICD-10-CM | POA: Diagnosis present

## 2017-04-17 DIAGNOSIS — I10 Essential (primary) hypertension: Secondary | ICD-10-CM | POA: Diagnosis not present

## 2017-04-17 DIAGNOSIS — N184 Chronic kidney disease, stage 4 (severe): Secondary | ICD-10-CM | POA: Diagnosis present

## 2017-04-17 DIAGNOSIS — A419 Sepsis, unspecified organism: Secondary | ICD-10-CM

## 2017-04-17 DIAGNOSIS — N189 Chronic kidney disease, unspecified: Secondary | ICD-10-CM | POA: Diagnosis not present

## 2017-04-17 DIAGNOSIS — Z515 Encounter for palliative care: Secondary | ICD-10-CM

## 2017-04-17 DIAGNOSIS — Z6841 Body Mass Index (BMI) 40.0 and over, adult: Secondary | ICD-10-CM | POA: Diagnosis not present

## 2017-04-17 DIAGNOSIS — I480 Paroxysmal atrial fibrillation: Secondary | ICD-10-CM | POA: Diagnosis present

## 2017-04-17 DIAGNOSIS — Z803 Family history of malignant neoplasm of breast: Secondary | ICD-10-CM | POA: Diagnosis not present

## 2017-04-17 DIAGNOSIS — J690 Pneumonitis due to inhalation of food and vomit: Secondary | ICD-10-CM | POA: Diagnosis present

## 2017-04-17 DIAGNOSIS — Z87442 Personal history of urinary calculi: Secondary | ICD-10-CM | POA: Diagnosis not present

## 2017-04-17 DIAGNOSIS — E875 Hyperkalemia: Secondary | ICD-10-CM | POA: Diagnosis present

## 2017-04-17 DIAGNOSIS — R0602 Shortness of breath: Secondary | ICD-10-CM

## 2017-04-17 DIAGNOSIS — J181 Lobar pneumonia, unspecified organism: Secondary | ICD-10-CM

## 2017-04-17 DIAGNOSIS — Z87891 Personal history of nicotine dependence: Secondary | ICD-10-CM | POA: Diagnosis not present

## 2017-04-17 DIAGNOSIS — N3000 Acute cystitis without hematuria: Secondary | ICD-10-CM | POA: Diagnosis present

## 2017-04-17 DIAGNOSIS — G9341 Metabolic encephalopathy: Secondary | ICD-10-CM

## 2017-04-17 DIAGNOSIS — N183 Chronic kidney disease, stage 3 (moderate): Secondary | ICD-10-CM | POA: Diagnosis not present

## 2017-04-17 DIAGNOSIS — N179 Acute kidney failure, unspecified: Secondary | ICD-10-CM | POA: Diagnosis present

## 2017-04-17 DIAGNOSIS — Z7189 Other specified counseling: Secondary | ICD-10-CM

## 2017-04-17 DIAGNOSIS — Z7984 Long term (current) use of oral hypoglycemic drugs: Secondary | ICD-10-CM | POA: Diagnosis not present

## 2017-04-17 DIAGNOSIS — B961 Klebsiella pneumoniae [K. pneumoniae] as the cause of diseases classified elsewhere: Secondary | ICD-10-CM | POA: Diagnosis present

## 2017-04-17 DIAGNOSIS — A4150 Gram-negative sepsis, unspecified: Secondary | ICD-10-CM | POA: Diagnosis present

## 2017-04-17 DIAGNOSIS — E1122 Type 2 diabetes mellitus with diabetic chronic kidney disease: Secondary | ICD-10-CM | POA: Diagnosis present

## 2017-04-17 DIAGNOSIS — Z7901 Long term (current) use of anticoagulants: Secondary | ICD-10-CM | POA: Diagnosis not present

## 2017-04-17 DIAGNOSIS — Z8042 Family history of malignant neoplasm of prostate: Secondary | ICD-10-CM | POA: Diagnosis not present

## 2017-04-17 DIAGNOSIS — E785 Hyperlipidemia, unspecified: Secondary | ICD-10-CM | POA: Diagnosis present

## 2017-04-17 DIAGNOSIS — F329 Major depressive disorder, single episode, unspecified: Secondary | ICD-10-CM | POA: Diagnosis present

## 2017-04-17 DIAGNOSIS — K219 Gastro-esophageal reflux disease without esophagitis: Secondary | ICD-10-CM | POA: Diagnosis present

## 2017-04-17 DIAGNOSIS — I129 Hypertensive chronic kidney disease with stage 1 through stage 4 chronic kidney disease, or unspecified chronic kidney disease: Secondary | ICD-10-CM | POA: Diagnosis present

## 2017-04-17 DIAGNOSIS — Z87440 Personal history of urinary (tract) infections: Secondary | ICD-10-CM | POA: Diagnosis not present

## 2017-04-17 LAB — BASIC METABOLIC PANEL
Anion gap: 11 (ref 5–15)
BUN: 85 mg/dL — AB (ref 6–20)
CO2: 21 mmol/L — AB (ref 22–32)
CREATININE: 3.22 mg/dL — AB (ref 0.61–1.24)
Calcium: 8.6 mg/dL — ABNORMAL LOW (ref 8.9–10.3)
Chloride: 105 mmol/L (ref 101–111)
GFR calc Af Amer: 21 mL/min — ABNORMAL LOW (ref >60)
GFR calc non Af Amer: 18 mL/min — ABNORMAL LOW (ref >60)
Glucose, Bld: 96 mg/dL (ref 65–99)
Potassium: 6.2 mmol/L — ABNORMAL HIGH (ref 3.5–5.1)
Sodium: 137 mmol/L (ref 135–145)

## 2017-04-17 LAB — CBC WITH DIFFERENTIAL/PLATELET
BASOS ABS: 0 10*3/uL (ref 0.0–0.1)
Basophils Relative: 0 %
Eosinophils Absolute: 0.2 10*3/uL (ref 0.0–0.7)
Eosinophils Relative: 1 %
HEMATOCRIT: 34.1 % — AB (ref 39.0–52.0)
HEMOGLOBIN: 10.1 g/dL — AB (ref 13.0–17.0)
LYMPHS PCT: 19 %
Lymphs Abs: 2.3 10*3/uL (ref 0.7–4.0)
MCH: 26.6 pg (ref 26.0–34.0)
MCHC: 29.6 g/dL — ABNORMAL LOW (ref 30.0–36.0)
MCV: 89.7 fL (ref 78.0–100.0)
Monocytes Absolute: 1.3 10*3/uL — ABNORMAL HIGH (ref 0.1–1.0)
Monocytes Relative: 11 %
NEUTROS ABS: 8.4 10*3/uL — AB (ref 1.7–7.7)
NEUTROS PCT: 69 %
Platelets: 247 10*3/uL (ref 150–400)
RBC: 3.8 MIL/uL — AB (ref 4.22–5.81)
RDW: 15.8 % — ABNORMAL HIGH (ref 11.5–15.5)
WBC: 12.1 10*3/uL — AB (ref 4.0–10.5)

## 2017-04-17 LAB — GLUCOSE, CAPILLARY: Glucose-Capillary: 90 mg/dL (ref 65–99)

## 2017-04-17 LAB — URINE CULTURE

## 2017-04-17 LAB — PROCALCITONIN: Procalcitonin: 14.64 ng/mL

## 2017-04-17 LAB — MAGNESIUM: Magnesium: 1.6 mg/dL — ABNORMAL LOW (ref 1.7–2.4)

## 2017-04-17 MED ORDER — MORPHINE BOLUS VIA INFUSION
1.0000 mg | INTRAVENOUS | Status: DC | PRN
  Administered 2017-04-18 (×5): 1 mg via INTRAVENOUS
  Filled 2017-04-17: qty 1

## 2017-04-17 MED ORDER — SODIUM CHLORIDE 0.9 % IV SOLN: INTRAVENOUS | Status: DC

## 2017-04-17 MED ORDER — LEVALBUTEROL HCL 1.25 MG/0.5ML IN NEBU
1.2500 mg | INHALATION_SOLUTION | Freq: Once | RESPIRATORY_TRACT | Status: AC
  Administered 2017-04-17: 1.25 mg via RESPIRATORY_TRACT
  Filled 2017-04-17: qty 0.5

## 2017-04-17 MED ORDER — PIPERACILLIN-TAZOBACTAM 3.375 G IVPB
3.3750 g | Freq: Three times a day (TID) | INTRAVENOUS | Status: DC
  Administered 2017-04-17 (×2): 3.375 g via INTRAVENOUS
  Filled 2017-04-17 (×3): qty 50

## 2017-04-17 MED ORDER — DILTIAZEM HCL 25 MG/5ML IV SOLN
10.0000 mg | Freq: Once | INTRAVENOUS | Status: AC
  Administered 2017-04-17: 10 mg via INTRAVENOUS
  Filled 2017-04-17: qty 5

## 2017-04-17 MED ORDER — SODIUM POLYSTYRENE SULFONATE 15 GM/60ML PO SUSP
15.0000 g | Freq: Once | ORAL | Status: DC
  Filled 2017-04-17: qty 60

## 2017-04-17 MED ORDER — VANCOMYCIN HCL 10 G IV SOLR: 1750.0000 mg | INTRAVENOUS | Status: DC

## 2017-04-17 MED ORDER — SODIUM CHLORIDE 0.9 % IV SOLN
2.0000 mg/h | INTRAVENOUS | Status: DC
  Administered 2017-04-17: 1 mg/h via INTRAVENOUS
  Filled 2017-04-17: qty 10

## 2017-04-17 MED ORDER — SODIUM CHLORIDE 0.9 % IV BOLUS (SEPSIS)
1000.0000 mL | Freq: Once | INTRAVENOUS | Status: AC
  Administered 2017-04-17: 1000 mL via INTRAVENOUS

## 2017-04-17 MED ORDER — HALOPERIDOL LACTATE 5 MG/ML IJ SOLN: 0.5000 mg | INTRAMUSCULAR | Status: DC | PRN

## 2017-04-17 MED ORDER — SODIUM CHLORIDE 0.9 % IV SOLN
2000.0000 mg | Freq: Once | INTRAVENOUS | Status: AC
  Administered 2017-04-17: 2000 mg via INTRAVENOUS
  Filled 2017-04-17: qty 2000

## 2017-04-17 MED ORDER — MORPHINE SULFATE (PF) 2 MG/ML IV SOLN
2.0000 mg | Freq: Once | INTRAVENOUS | Status: AC
  Administered 2017-04-17: 2 mg via INTRAVENOUS

## 2017-04-17 MED ORDER — GLYCOPYRROLATE 0.2 MG/ML IJ SOLN: 0.2000 mg | INTRAMUSCULAR | Status: DC | PRN

## 2017-04-17 MED ORDER — LEVALBUTEROL HCL 0.63 MG/3ML IN NEBU: 0.6300 mg | INHALATION_SOLUTION | RESPIRATORY_TRACT | Status: DC | PRN

## 2017-04-17 MED ORDER — GLYCOPYRROLATE 1 MG PO TABS: 1.0000 mg | ORAL_TABLET | ORAL | Status: DC | PRN

## 2017-04-17 MED ORDER — SODIUM CHLORIDE 0.9 % IV BOLUS (SEPSIS)
500.0000 mL | Freq: Once | INTRAVENOUS | Status: AC
  Administered 2017-04-17: 500 mL via INTRAVENOUS

## 2017-04-17 MED ORDER — MORPHINE SULFATE (PF) 2 MG/ML IV SOLN: 2.0000 mg | INTRAVENOUS | Status: DC | PRN

## 2017-04-17 MED ORDER — HALOPERIDOL LACTATE 2 MG/ML PO CONC
0.5000 mg | ORAL | Status: DC | PRN
  Filled 2017-04-17: qty 0.3

## 2017-04-17 MED ORDER — DEXTROSE-NACL 5-0.9 % IV SOLN: INTRAVENOUS | Status: DC

## 2017-04-17 MED ORDER — MORPHINE SULFATE (PF) 2 MG/ML IV SOLN
INTRAVENOUS | Status: AC
  Filled 2017-04-17: qty 1

## 2017-04-17 MED ORDER — HALOPERIDOL 0.5 MG PO TABS
0.5000 mg | ORAL_TABLET | ORAL | Status: DC | PRN
  Filled 2017-04-17: qty 1

## 2017-04-17 NOTE — Significant Event (Addendum)
Rapid Response Event Note  Overview: Time Called: 2253 Arrival Time: 2300 Event Type: Unknown, Cardiac  Initial Focused Assessment: Called by RN to evaluate patient for chest pain.  I asked the RN to get an EKG.  Upon arrival, patient denied chest pain but was experiencing some shortness of breath and mild tachypnea, which per documentation has been going on.  EKG + AFIB RVR HR was in the 130-140s, patient was not on telemetry but has a history of AF.  SBP was in the 100s, RN stated she had RT evaluate the patient for low sats, so prior to my arrival, patient was placed on 6L nasal cannula.  SpO2 was 99% when I walked in, oxygen was titrated down to 3L.  Initially lung sounds diminished bilaterally, skin warm, + pulses. Patient is sleepy, awakes to voices, has been and is confused.   I was called away to an urgent call, I asked the bedside RN to page [facility] NP and ask for AFIB RVR treatment orders, continuous telemetry/pulse ox monitoring, and I will return.   I returned at [place], RN received orders for IV Morphine 2mg  and IV Metoprolol 5mg .  VS rechecked, BP were not reading accurately at times, SBP in the upper 70s-80s, morphine and lopressor held, I spoke with [facility] NP, started at 500cc NS bolus, administered 2.5mg  Lopressor IV, BP did improved in the SBP 90-100s, HR was still in the 130-140s, diltiazem 10mg  IV given and lung sounds were reassessed, + upper airway wheezing, patient given Xopenox neb treatment as well. CXR done, + RUL PNA - HCAP. SBP improved into the low 100-110s, wheezing improved, HR 90-110s in and out ST/AF.   [facility] NP did come to the bedside, patient was repositioned while being assessed and patient was in severe pain after being repositioned, recent T7 compression fracture, NP ordered Morphine 2mg  IV, SBP > 100, morphine given, after several minutes, patient was calm and fell asleep.   Interventions: - EKG- reviewed  - CXR STAT - Placed on continuous telemetry and pulse oximetry  monitoring - NS 500cc bolus - Lopressor 2.5mg  IV - Diltiazem 10mg  IV - Xopenox x 1 - Morphine 2mg  IV  Plan of Care (if not transferred): - NP to order antibiotics - will follow as needed   Event Summary: Name of Physician Notified: Lamar Blinks NP by Primary RN  at    Name of Consulting Physician Notified: Lamar Blinks NP by RR RN  at [place]: Stayed in room and stabalized  Event End Time: [place], [place]

## 2017-04-17 NOTE — Progress Notes (Addendum)
Pharmacy Antibiotic Note  Alex Bowman is a 71 y.o. male admitted on 04/15/2017 with elevated potassium and WBC. Pharmacy has been consulted for vancomycin and zosyn dosing for possible HCAP.  Patient is afebrile and WBC elevated at 11.1. SCr is 3.54 for estimated CrCl ~ 25-30 mL/min. Patient with known CKD and BL Scr ~ 1.3-1.9.   Plan: Vancomycin 2g IV x1, then 1750 mg IV q48hr  Zosyn 3.375g IV q8hr  Monitor renal function, clinical picture, and culture data Vancomycin trough at [facility] and as needed (goal 15-20 mcg/mL)  Height: 5\' 10"  (177.8 cm) Weight: 287 lb 8 oz (130.4 kg) IBW/kg (Calculated) : 73  Temp (24hrs), Avg:98.1 F (36.7 C), Min:97.5 F (36.4 C), Max:98.6 F (37 C)   Recent Labs Lab 04/15/17 1148 04/15/17 1213 04/15/17 1654 04/16/17 0617  WBC 13.0*  --   --  11.1*  CREATININE 4.18*  --   --  3.54*  LATICACIDVEN  --  1.26 1.23  --     Estimated Creatinine Clearance: 26 mL/min (A) (by C-G formula based on SCr of 3.54 mg/dL (H)).    No Known Allergies  Antimicrobials this admission: Vanc 9/6 >>  Zosyn 9/6 >>  CTX 9/4 > 9/5   Microbiology 9/4 Blood cx:  9/4 Urine cx: >100,000 kleb. pna 9/4 MRSA PCR: neg  Argie Ramming, PharmD Clinical Pharmacist 04/17/17 2:20 AM

## 2017-04-17 NOTE — Evaluation (Signed)
Clinical/Bedside Swallow Evaluation Patient Details  Name: Alex Bowman MRN: [id number] Date of Birth: [birth year]  Today's Date: 04/17/2017 Time: SLP Start Time (ACUTE ONLY): 4174 SLP Stop Time (ACUTE ONLY): 1005 SLP Time Calculation (min) (ACUTE ONLY): 27 min  Past Medical History:  Past Medical History:  Diagnosis Date  . Arthritis   . Bilateral lower extremity edema    Chronic, with venous insufficiency  . Cataract    beginning  . Chronic kidney disease    kidney stones  . CKD (chronic kidney disease) stage 3, GFR 30-59 ml/min   . Complication of anesthesia    stayed awake during colonoscopy  . Diabetes mellitus without complication ([place])    "on medicine, but numbers have been good for years since weight loss"  . Dyslipidemia    Monitored by Primary Physician. On statin  . Dysrhythmia   . GERD (gastroesophageal reflux disease)   . History of kidney stones   . Hypertension   . Knee pain    R>L  . Morbid obesity with BMI of 45.0-49.9, adult ([place])    Attempted weight loss efforts at last visit had BMI down to 42, unfortunately back up to 46 with weight going up to 312 lb from 285 lb  . Pneumonia 20 years ago   h/o  . UTI (urinary tract infection) 04/2017   Past Surgical History:  Past Surgical History:  Procedure Laterality Date  . 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 Mild elevation in LVDP, EF 55%. --> False-positive stress test  . CARDIOVERSION N/A 11/27/2016   Procedure: CARDIOVERSION;  Surgeon: Skeet Latch, MD;  Location: [facility];  Service: Cardiovascular;  Laterality: N/A;  . COLONOSCOPY    . CYSTOSCOPY WITH STENT PLACEMENT Left 04/19/2013   Procedure: CYSTOSCOPY WITH STENT PLACEMENT;  Surgeon: Alexis Frock, MD;  Location: [facility];  Service: Urology;  Laterality: Left;  RIGHT RETROGRADE PYELOGRAM  . CYSTOSCOPY WITH URETEROSCOPY Left 04/21/2013   Procedure: CYSTOSCOPY WITH URETEROSCOPY bilateral retrograde,  bilateral stent change, stone extraction;  Surgeon: Alexis Frock, MD;  Location: [facility];  Service: Urology;  Laterality: Left;  bilateral ureters  . HAND SURGERY Bilateral 15 years ago  . HOLMIUM LASER APPLICATION Right 0/81/4481   Procedure: HOLMIUM LASER APPLICATION;  Surgeon: Alexis Frock, MD;  Location: [facility];  Service: Urology;  Laterality: Right;  . NEPHROLITHOTOMY Right 04/19/2013   Procedure: NEPHROLITHOTOMY PERCUTANEOUS;  Surgeon: Alexis Frock, MD;  Location: [facility];  Service: Urology;  Laterality: Right;  . NEPHROLITHOTOMY Right 04/21/2013   Procedure: NEPHROLITHOTOMY PERCUTANEOUS RIGHT 2ND STAGE PERCUTANEOUS NEPHROLITHOTOMY;  Surgeon: Alexis Frock, MD;  Location: [facility];  Service: Urology;  Laterality: Right;  . NM [employer]; [place]  May 2012   Suggested as high risk with 3 times a day 1.2 to, mild to moderate 3 times a day affecting basolateral and mid lateral walls.  [name] POLYPECTOMY     HPI:  71 y.o. male who presents with significant AKI and Hyperkalemia in the setting of CKD likely from UTI, and poor oral intake leading to dehydration.  Bedside swallow evaluation ordered due to new findings of RUL PNA following rapid response event last night     Assessment / Plan / Recommendation Clinical Impression   Pt presents with a moderate oropharyngeal dysphagia resulting from lethargy and confusion as well as decreased respiratory status.  Pt had minimal verbal output upon therapist's arrival but voice was briefly noted to be hoarse but clear  prior to initiation of PO intake.  Oral mucosa notably dry.  Pt demonstrated poor containment of thin liquids due to decreased attention to boluses and generalized oral motor weakness and needed max to total assist to correct anterior labial spillage.  Straws helped but did not completely prevent spillage.  Pt demonstrated x1 delayed cough following initial sip of thin liquids and swallow response appeared intermittently discoordinated/delayed  which SLP suspects to be related to mentation and respiratory status.  Pt with labored breathing throughout evaluation which did not worsen with PO intake.  Pt with minimal attempts to masticate solids.  Therefore, SLP recommends initiating a dys 1 diet with thin liquids, meds crushed in purees.  It is questionable if pt will be able to maintain hydration/nutrition on any diet given that wife reports pt had minimal PO intake prior to admission and had "given up" on getting better.  ST to follow up for management of diet progression while inpatient.   SLP Visit Diagnosis: Dysphagia, oropharyngeal phase (R13.12)    Aspiration Risk  Moderate aspiration risk;Risk for inadequate nutrition/hydration    Diet Recommendation Dysphagia 1 (Puree);Thin liquid   Liquid Administration via: Cup;Straw Medication Administration: Crushed with puree Supervision: Staff to assist with self feeding;Full supervision/cueing for compensatory strategies Compensations: Minimize environmental distractions;Slow rate;Small sips/bites Postural Changes: Seated upright at 90 degrees;Remain upright for at least 30 minutes after po intake    Other  Recommendations Oral Care Recommendations: Oral care QID   Follow up Recommendations Other (comment) (TBD)      Frequency and Duration min 2x/week  1 week       Prognosis Prognosis for Safe Diet Advancement: Guarded Barriers to Reach Goals: Severity of deficits;Motivation      Swallow Study   General Date of Onset: 04/15/17 HPI: 71 y.o. male who presents with significant AKI and Hyperkalemia in the setting of CKD likely from UTI, and poor oral intake leading to dehydration.  Bedside swallow evaluation ordered due to new findings of RUL PNA following rapid response event last night   Type of Study: Bedside Swallow Evaluation Previous Swallow Assessment: none on record Diet Prior to this Study: NPO Temperature Spikes Noted: No Respiratory Status: Nasal cannula  (3L) History of Recent Intubation: No Behavior/Cognition: Lethargic/Drowsy;Confused;Cooperative Oral Cavity Assessment: Dry Oral Care Completed by SLP: No Oral Cavity - Dentition: Adequate natural dentition Vision: Functional for self-feeding Self-Feeding Abilities: Needs assist Patient Positioning: Upright in bed Baseline Vocal Quality: Low vocal intensity;Breathy Volitional Cough: Cognitively unable to elicit    Oral/Motor/Sensory Function Overall Oral Motor/Sensory Function: Generalized oral weakness   Ice Chips Ice chips: Impaired Presentation: Spoon Oral Phase Impairments: Impaired mastication;Reduced lingual movement/coordination   Thin Liquid Thin Liquid: Impaired Presentation: Straw Oral Phase Impairments: Reduced labial seal Oral Phase Functional Implications: Prolonged oral transit Pharyngeal  Phase Impairments: Cough - Immediate (x1)    Nectar Thick     Honey Thick     Puree Puree: Within functional limits   Solid   GO   Solid: Impaired Oral Phase Impairments: Poor awareness of bolus    Functional Assessment Tool Used: bedside swallow evaluation completed Functional Limitations: Swallowing Swallow Current Status (V6160): At least 60 percent but less than 80 percent impaired, limited or restricted Swallow Goal Status [phone]): At least 20 percent but less than 40 percent impaired, limited or restricted   Windell Moulding L 04/17/2017,10:15 AM

## 2017-04-17 NOTE — Progress Notes (Addendum)
Shift event Note:  Notified by RN just after shift change that pt unable to take po meds. States pt has forceful coughing w/ attempts to swallow pills. Made completely NPO for now. Pt's HR trended up over last 2-3 hours (from 70's to 130's). Has remained in a-fib w/ controlled rate on po BB but was unable to take Metoprolol and Eliquis d/t apparent difficulty swallowing. Some improvement in HR (low 100's) after IV Lopressor and Cardizem and small IVFB. BP remains soft as it has been intermittently since admission. Pt also remains mildly tachypneic and has had increased 02 demand  this shift. 02 sats had been 94% on r/a, now 85-90% w/ return to baseline of 98-100% after 02 3L [place] added. RR RN was paged and responded to bedside. Per her assessment pt noted w/ bilateral wheezes. Xopenex neb ordered w/ some improvement in wheezes.  Obtained PCXR which revealed new (perihilar and RUL) infiltrates. Pt has no fever and leukocytosis has improved since admission. At bedside pt noted resting in NAD though mildly tachypenic (28-34) and grimaces w/ movement. He awakens easily and can answer simple questions. He c/o pain and guards and grimaces when repositioned. Last medication for pain at 9 am yesterday. BBS diminished w/ bilateral fine crackles and some upper airway expiratory wheezes that worsen w/ movement. Abd pendulous, soft w/o apparent TTP and + bs.  Assessment/Plan: 1. Hypoxia: Etiology unclear. Concern for aspiration vs pneumonia vs volume overload. Will d/c Rocephin and start IV Vanc and Zosyn to cover for HCAP/UTI. Add scheduled xopenex nebs. Continue 02 via [place]. Continuous pulse oximetry. Reposition q2h. Will obtain swallow evaluation in am. Keep NPO.  2. A-fib/rvr: Rate uncontrolled. Suspect related,  in part,  to uncontrolled pain. HR has improved. Continue PRN lopressor and/or Cardizem as BP tolerates. Missed Eliquis at hs. Pt is on higher dose of Eliquis so considering current renal function will hold off on  alternate anti-coagulation until after swallow study in am.  3. Uncontrolled pain: Sustained acute T7 compression fx in 02/2017 during a fall. Pt is guarding and resisting repositioning d/t pain. Continue Fentanyl patch and Lidoderm patches. Add morphine 2 mg IV q3-4h PRN pain as BP tolerates. Will continue to monitor closely on telemetry.   Jeryl Columbia, NP-C [facility] Pager [phone]  CRITICAL CARE Performed by: Jeryl Columbia   Total critical care time: 90 minutes  Critical care time was exclusive of separately billable procedures and treating other patients.  Critical care was necessary to treat or prevent imminent or life-threatening deterioration.  Critical care was time spent personally by me on the following activities: development of treatment plan with patient and/or surrogate as well as nursing, discussions with consultants, evaluation of patient's response to treatment, examination of patient, obtaining history from patient or surrogate, ordering and performing treatments and interventions, ordering and review of laboratory studies, ordering and review of radiographic studies, pulse oximetry and re-evaluation of patient's condition.

## 2017-04-17 NOTE — Clinical Social Work Note (Signed)
Clinical Social Work Assessment  Patient Details  Name: Alex Bowman MRN: [id number] Date of Birth: [birth year]  Date of referral:  04/17/17               Reason for consult:  Discharge Planning                Permission sought to share information with:  Facility Sport and exercise psychologist, Family Supports Permission granted to share information::  No  Name::     Landscape architect::  [place]  Relationship::  Spouse  Contact Information:  [phone]  Housing/Transportation Living arrangements for the past 2 months:  [place] of Information:  Spouse, Facility Patient Interpreter Needed:  None Criminal Activity/Legal Involvement Pertinent to Current Situation/Hospitalization:  No - Comment as needed Significant Relationships:  Spouse Lives with:  Facility Resident, Spouse Do you feel safe going back to the place where you live?  Yes Need for family participation in patient care:  Yes (Comment)  Care giving concerns:  CSW received consult for possible SNF placement at time of discharge. Patient is disoriented. CSW spoke with patient's spouse. Patient came to the hospital from [facility] and would like to return at discharge. CSW to continue to follow and assist with discharge planning needs.   Social Worker assessment / plan:  CSW spoke with patient's spouse concerning possibility of return to rehab at [facility] before returning home.  Employment status:  Retired Nurse, adult PT Recommendations:  [place] / Referral to community resources:  [place]  Patient/Family's Response to care:  Patient's spouse is in agreement with discharge plan back to [facility] and would like PTAR.   Patient/Family's Understanding of and Emotional Response to Diagnosis, Current Treatment, and Prognosis:  Patient/family is realistic regarding therapy needs and expressed being hopeful for SNF placement. Patient's spouse  expressed understanding of CSW role and discharge process as well as medical condition. No questions/concerns about plan or treatment.    Emotional Assessment Appearance:  Appears stated age Attitude/Demeanor/Rapport:  Unable to Assess Affect (typically observed):  Unable to Assess Orientation:  Oriented to Self Alcohol / Substance use:  Not Applicable Psych involvement (Current and /or in the community):  No (Comment)  Discharge Needs  Concerns to be addressed:  Care Coordination Readmission within the last 30 days:  No Current discharge risk:  None Barriers to Discharge:  Continued Medical Work up   [employer], [place] 04/17/2017, 2:00 PM

## 2017-04-17 NOTE — Progress Notes (Signed)
No charge note:   Palliative consult received. Meeting with patient's spouse in person at 3:40pm.   Spoke with spouse via phone. Discussed concern that patient is rapidly declining, hypotensive, is not responsive at this time. He is partial code, allowing for CPR and other resuscitative effort- no intubation. She states that patient would not want resuscitation. She requests full DNR status for patient- no intubation, no CPR.  Mariana Kaufman, AGNP-C Palliative Medicine  Please call Palliative Medicine team phone with any questions [phone]. For individual providers please see AMION.

## 2017-04-17 NOTE — Progress Notes (Signed)
PROGRESS NOTE [facility]   TARICK PARENTEAU   RKY:[id number] DOB: 09-25-1945  DOA: 04/15/2017 PCP: [name], MD   Brief Narrative:  Alex Bowman is a 71 year old male with past medical history significant for CKD stage IV, hypertension, diabetes mellitus type 2, chronic back pain who presented to the emergency department after being sent by his rehabilitation facility with elevated potassium of 6.9 and a white blood cell count of 29,000. Labs were obtained that they prior to admission as patient didn't rehabilitation show mild confusion and increasing weakness. ED evaluation patient was found to have possible UTI, acute kidney injury and was admitted for IV antibiotics and IV hydration.   Subjective: Patient seen and examined this morning, his symptoms or lethargic but responsive to verbal stimuli. Overnight patient had a rapid response due to rapid ventricular response and respiratory failure. Chest x-ray show possible aspiration versus volume overload. He was started on IV vancomycin and Zosyn for possible HCAP. In the morning patient was having difficulty breathing and has been rapidly declining.. Blood pressure remains low although.   Assessment & Plan: Sepsis secondary to UTI UA grossly abnormal Urine culture growing Klebsiella pneumoniae Patient has had severe decline due to sepsis. I spoke with wife and he has been declining for the past few months. So this infection in combination with deconditioning has placed the patient in critical condition. I discussed with wife and palliative care has been consulted. Wife informed me the patient does not want any heroic measures. And she will like to receive treatment for what ever can be treated. Continue IV Zosyn for now, IV fluids as needed  Acute on chronic kidney disease stage IV Creatinine continue to improve with IV fluids, patient was placed nothing by mouth due to possible aspiration. Monitor creatinine in the  morning.  Hyperkalemia - in view of renal failure Initially treated with IV fluids and Lasix. Give Kayexalate Check BMP in the morning  Type 2 diabetes mellitus - stable On oral metformin at home, which was held due to renal failure Monitor CBGs SSI A1c 5.4 well controlled  Hypertension - now hypotensive Highly secondary to sepsis Hold hypertensive medication IV fluids  A. Fib Went into A. Fib with RVR, Cardizem was given an IV metoprolol heart rate now better controlled. On Eliquis for anticoagulation Continue to monitor  Chronic back pain due to compression fracture  Pain control as needed  Goal of care Discussed with wife at bedside patient did not want any heroic measures Palliative care has been consulted, patient goes status has been switched to DO NOT RESUSCITATE Possible transition to comfort measures.  DVT prophylaxis: Eliquis  Code Status: DO NOT RESUSCITATE Family Communication: None at bedside  Disposition Plan: TBD   Consultants:   none  Procedures:   none  Antimicrobials: Anti-infectives    Start     Dose/Rate Route Frequency Ordered Stop   04/19/17 0600  vancomycin (VANCOCIN) 1,750 mg in sodium chloride 0.9 % 500 mL IVPB  Status:  Discontinued     1,750 mg 250 mL/hr over 120 Minutes Intravenous Every 48 hours 04/17/17 0222 04/17/17 0807   04/17/17 0230  vancomycin (VANCOCIN) 2,000 mg in sodium chloride 0.9 % 500 mL IVPB     2,000 mg 250 mL/hr over 120 Minutes Intravenous  Once 04/17/17 0222 04/17/17 0517   04/17/17 0230  piperacillin-tazobactam (ZOSYN) IVPB 3.375 g     3.375 g 12.5 mL/hr over 240 Minutes Intravenous Every 8 hours 04/17/17 0222  04/16/17 1400  cefTRIAXone (ROCEPHIN) 1 g in dextrose 5 % 50 mL IVPB  Status:  Discontinued     1 g 100 mL/hr over 30 Minutes Intravenous Every 24 hours 04/15/17 1627 04/17/17 0114   04/15/17 1300  cefTRIAXone (ROCEPHIN) 1 g in dextrose 5 % 50 mL IVPB     1 g 100 mL/hr over 30 Minutes Intravenous   Once 04/15/17 1246 04/15/17 1442     Objective: Vitals:   04/17/17 0544 04/17/17 1251 04/17/17 1328 04/17/17 1328  BP: 115/84 (!) 75/57 (!) 104/47 (!) 104/47  Pulse: (!) 131 (!) 107 (!) 104 (!) 104  Resp: 18  18 18   Temp: 97.8 F (36.6 C)  98.4 F (36.9 C) 98.4 F (36.9 C)  TempSrc: Axillary  Oral Oral  SpO2: 97%  97%   Weight: 119.7 kg (264 lb)     Height:        Intake/Output Summary (Last 24 hours) at 04/17/17 1553 Last data filed at 04/17/17 0900  Gross per 24 hour  Intake              670 ml  Output              600 ml  Net               70 ml   Filed Weights   04/15/17 1129 04/15/17 1835 04/17/17 0544  Weight: (!) 140.6 kg (310 lb) 130.4 kg (287 lb 8 oz) 119.7 kg (264 lb)    Examination:  General exam:  ill looking, mild respiratory distress Respiratory system: diminished breath sounds bilaterally, crackle at the bases. Periods of apnea Cardiovascular system: S1-S2, irregularly irregular 2/6 systolic murmur Gastrointestinal system: abdomen soft nontender nondistended Central nervous system: lethargic, but responsive to verbal stimuli continues to be confused Extremities: trace lower extremity edema Skin: No rashes Psychiatry: unable to evaluate  Data Reviewed: I have personally reviewed following labs and imaging studies  CBC:  Recent Labs Lab 04/15/17 1148 04/16/17 0617 04/17/17 0459  WBC 13.0* 11.1* 12.1*  NEUTROABS 9.0*  --  8.4*  HGB 11.0* 10.2* 10.1*  HCT 35.9* 33.8* 34.1*  MCV 88.2 88.7 89.7  PLT 209 226 948   Basic Metabolic Panel:  Recent Labs Lab 04/15/17 1148 04/16/17 0617 04/17/17 0459  NA 133* 136 137  K 5.8* 5.4* 6.2*  CL 98* 104 105  CO2 22 19* 21*  GLUCOSE 101* 89 96  BUN 92* 83* 85*  CREATININE 4.18* 3.54* 3.22*  CALCIUM 8.9 8.2* 8.6*  MG  --   --  1.6*   GFR: Estimated Creatinine Clearance: 27.3 mL/min (A) (by C-G formula based on SCr of 3.22 mg/dL (H)). Liver Function Tests:  Recent Labs Lab 04/15/17 1148  04/16/17 0617  AST 51* 35  ALT 39 31  ALKPHOS 138* 118  BILITOT 0.5 0.7  PROT 7.0 6.4*  ALBUMIN 2.5* 2.3*    Recent Labs Lab 04/15/17 1148  LIPASE 26   No results for input(s): AMMONIA in the last 168 hours. Coagulation Profile: No results for input(s): INR, PROTIME in the last 168 hours. Cardiac Enzymes: No results for input(s): CKTOTAL, CKMB, CKMBINDEX, TROPONINI in the last 168 hours. BNP (last 3 results) No results for input(s): PROBNP in the last 8760 hours. HbA1C:  Recent Labs  04/15/17 1637  HGBA1C 5.6   CBG:  Recent Labs Lab 04/16/17 0752 04/16/17 1205 04/16/17 1728 04/16/17 2102 04/17/17 0752  GLUCAP 83 111* 86 95 90   Lipid  Profile: No results for input(s): CHOL, HDL, LDLCALC, TRIG, CHOLHDL, LDLDIRECT in the last 72 hours. Thyroid Function Tests: No results for input(s): TSH, T4TOTAL, FREET4, T3FREE, THYROIDAB in the last 72 hours. Anemia Panel: No results for input(s): VITAMINB12, FOLATE, FERRITIN, TIBC, IRON, RETICCTPCT in the last 72 hours. Sepsis Labs:  Recent Labs Lab 04/15/17 1213 04/15/17 1654 04/17/17 0913  PROCALCITON  --   --  14.64  LATICACIDVEN 1.26 1.23  --     Recent Results (from the past 240 hour(s))  Urine culture     Status: Abnormal   Collection Time: 04/15/17 11:52 AM  Result Value Ref Range Status   Specimen Description URINE, RANDOM  Final   Special Requests NONE  Final   Culture >=100,000 COLONIES/mL KLEBSIELLA PNEUMONIAE (A)  Final   Report Status 04/17/2017 FINAL  Final   Organism ID, Bacteria KLEBSIELLA PNEUMONIAE (A)  Final      Susceptibility   Klebsiella pneumoniae - MIC*    AMPICILLIN >=32 RESISTANT Resistant     CEFAZOLIN <=4 SENSITIVE Sensitive     CEFTRIAXONE <=1 SENSITIVE Sensitive     CIPROFLOXACIN <=0.25 SENSITIVE Sensitive     GENTAMICIN <=1 SENSITIVE Sensitive     IMIPENEM <=0.25 SENSITIVE Sensitive     NITROFURANTOIN 64 INTERMEDIATE Intermediate     TRIMETH/SULFA <=20 SENSITIVE Sensitive      AMPICILLIN/SULBACTAM 8 SENSITIVE Sensitive     PIP/TAZO <=4 SENSITIVE Sensitive     Extended ESBL NEGATIVE Sensitive     * >=100,000 COLONIES/mL KLEBSIELLA PNEUMONIAE  Blood culture (routine x 2)     Status: None (Preliminary result)   Collection Time: 04/15/17  4:40 PM  Result Value Ref Range Status   Specimen Description BLOOD LEFT WRIST  Final   Special Requests   Final    BOTTLES DRAWN AEROBIC AND ANAEROBIC Blood Culture adequate volume   Culture NO GROWTH 2 DAYS  Final   Report Status PENDING  Incomplete  Blood culture (routine x 2)     Status: None (Preliminary result)   Collection Time: 04/15/17  4:48 PM  Result Value Ref Range Status   Specimen Description BLOOD LEFT ANTECUBITAL  Final   Special Requests   Final    BOTTLES DRAWN AEROBIC AND ANAEROBIC Blood Culture adequate volume   Culture NO GROWTH 2 DAYS  Final   Report Status PENDING  Incomplete  MRSA PCR Screening     Status: None   Collection Time: 04/15/17  7:59 PM  Result Value Ref Range Status   MRSA by PCR NEGATIVE NEGATIVE Final    Comment:        The GeneXpert MRSA Assay (FDA approved for NASAL specimens only), is one component of a comprehensive MRSA colonization surveillance program. It is not intended to diagnose MRSA infection nor to guide or monitor treatment for MRSA infections.       Radiology Studies: Dg Chest Port 1 View  Result Date: 04/16/2017 CLINICAL DATA:  Shortness of breath EXAM: PORTABLE CHEST 1 VIEW COMPARISON:  04/15/2017 FINDINGS: Very low lung volumes. Increased perihilar opacity and right upper lobe focal opacity. No pleural effusion. Cardiomediastinal silhouette stable. No pneumothorax. IMPRESSION: Very low lung volumes. Development of perihilar and right upper lobe pulmonary infiltrates Electronically Signed   By: Donavan Foil M.D.   On: 04/16/2017 23:51      Scheduled Meds: . apixaban  5 mg Oral BID  . atorvastatin  40 mg Oral q morning - 10a  . darifenacin  7.5 mg Oral  Daily  . fentaNYL  25 mcg Transdermal Q72H  . furosemide  20 mg Oral Daily  . insulin aspart  0-9 Units Subcutaneous TID WC  . lidocaine  1 patch Transdermal Q24H  . metoprolol succinate  75 mg Oral BID  . sertraline  50 mg Oral Daily  . sodium polystyrene  15 g Oral Once   Continuous Infusions: . sodium chloride    . piperacillin-tazobactam (ZOSYN)  IV 3.375 g (04/17/17 1250)     LOS: 0 days    Time spent: Total of 35 minutes spent with pt, greater than 50% of which was spent in discussion of  treatment, counseling and coordination of care    Chipper Oman, MD Pager: Text Page via [email or web address]   If 7PM-7AM, please contact night-coverage [email or web address] 04/17/2017, 3:53 PM

## 2017-04-17 NOTE — Consult Note (Signed)
Consultation Note Date: 04/17/2017   Patient Name: Alex Bowman  DOB: [birth year]  MRN: [id number]  Age / Sex: 71 y.o., male  PCP: [name], MD Referring Physician: Patrecia Pour, Christean Grief, MD  Reason for Consultation: Establishing goals of care and Terminal Care  HPI/Patient Profile: 70 y.o. male  with past medical history of afib, CKD3, HTN, DM3, morbid obesity, HTN, HLD, recurrent UTIs, multiple falls with compression fractures admitted from [facility] on 04/15/2017 with confusion and increasing weakness.  Workup during this admission has revealed sepsis UTI, aspiration pneumonia, hyperkalemia, acute on chronic kidney injury. Early in the morning of 9/6 he had a significant event of a fib with RVR and respiratory failure. Chest xray shows development of upper lobe infiltrates. He has been non responsive since. Palliative medicine consulted for possible end of life care assistance.   Clinical Assessment and Goals of Care: Met with patient's spouse and childreto discuss diagnosis prognosis, GOC, EOL wishes, disposition and options.  I have reviewed medical records including EPIC notes, labs and imaging,  to discuss diagnosis prognosis, GOC, EOL wishes, disposition and options.  We discussed a brief life review of the patient. Patient has been living at [facility] since July. He has had significant decline in functional, nutritional and cognitive status since his admission there. He has not walked in over a month.   We discussed their current illness and what it means in the larger context of their on-going co-morbidities.  Natural disease trajectory and expectations at EOL were discussed. His wife tells me that she believes he is dying. She feels at this point he would not want his life prolonged in any way- including with IV fluids, or antibiotics. Her GOC at this point are to ensure he is comfortable and  without suffering. She watched him struggle to breath this morning and does not want that to continue.  I attempted to elicit values and goals of care important to the patient. He enjoyed being independent. He has not enjoyed living in the nursing facility. His spouse believes that his life has not improved at the SNF and his quality of life has been poor. She believes that palliative care and comfort are most important.    The difference between aggressive medical intervention and comfort care was considered in light of the patient's goals of care.   Advanced directives, concepts specific to code status, artifical feeding and hydration, and rehospitalization were considered and discussed.      Primary Decision Maker NEXT OF KIN- patient's spouse- [name]    SUMMARY OF RECOMMENDATIONS -DNR -Patient appears to be actively dying with labored breathing, periods of apnea, hypotension, nonresponsive, his wife verbalizes her belief of the same and she believes he has been dying since last night -Transition care to comfort measures -D/C life prolonging measures including IV fluids, antibiotics, cardiac monitoring  -Morphine infusion initiate 16m/hr with bolus [id number] SOB or discomfort- will titrate drip based on 24hr bolus dose requirements  Lorazepam 1mg49m, SL or IV  q4 hr anxiety  Haldol .64m po, SL or IV q4 hr PRN agitation  Glycopyrrolate .237mIV q4hr secretions    Code Status/Advance Care Planning:  DNR  Palliative Prophylaxis:   Frequent Pain Assessment  Additional Recommendations (Limitations, Scope, Preferences):  Full Comfort Care  Prognosis:    Hours - Days  Discharge Planning: Anticipated Hospital Death  Primary Diagnoses: Present on Admission: . CKD (chronic kidney disease) stage 3, GFR 30-59 ml/min . DM type 2 causing CKD stage 3 ([place]. AF (paroxysmal atrial fibrillation) ([facility]. Anemia . Closed wedge compression fracture of T7 vertebra  ([facility] [place]. Acute metabolic encephalopathy . AKI (acute kidney injury) ([place]. Sepsis ([place]  I have reviewed the medical record, interviewed the patient and family, and examined the patient. The following aspects are pertinent.  Past Medical History:  Diagnosis Date  . Arthritis   . Bilateral lower extremity edema    Chronic, with venous insufficiency  . Cataract    beginning  . Chronic kidney disease    kidney stones  . CKD (chronic kidney disease) stage 3, GFR 30-59 ml/min   . Complication of anesthesia    stayed awake during colonoscopy  . Diabetes mellitus without complication ([facility]   "on medicine, but numbers have been good for years since weight loss"  . Dyslipidemia    Monitored by Primary Physician. On statin  . Dysrhythmia   . GERD (gastroesophageal reflux disease)   . History of kidney stones   . Hypertension   . Knee pain    R>L  . Morbid obesity with BMI of 45.0-49.9, adult ([facility]   Attempted weight loss efforts at last visit had BMI down to 42, unfortunately back up to 46 with weight going up to 312 lb from 285 lb  . Pneumonia 20 years ago   h/o  . UTI (urinary tract infection) 04/2017   Social History   Social History  . Marital status: Married    Spouse name: N/A  . Number of children: N/A  . Years of education: N/A   Social History Main Topics  . Smoking status: Former Smoker    Packs/day: 1.00    Years: 30.00    Types: Cigarettes    Quit date: 03/02/1997  . Smokeless tobacco: Former [employer] developer  Types: Chew    Quit date: 08/12/2002  . Alcohol use No  . Drug use: No  . Sexual activity: No   Other Topics Concern  . None   Social History Narrative   He is a married father of 2, grandfather of one. Quit smoking over 15 years ago. Does not drink. Limited exercise due to knees and hip pain.   Family History  Problem Relation Age of Onset  . Breast cancer Mother   . Prostate cancer Father   . Colon cancer Neg Hx    Scheduled Meds: . apixaban  5 mg  Oral BID  . atorvastatin  40 mg Oral q morning - 10a  . darifenacin  7.5 mg Oral Daily  . fentaNYL  25 mcg Transdermal Q72H  . furosemide  20 mg Oral Daily  . insulin aspart  0-9 Units Subcutaneous TID WC  . lidocaine  1 patch Transdermal Q24H  . metoprolol succinate  75 mg Oral BID  . sertraline  50 mg Oral Daily  . sodium polystyrene  15 g Oral Once   Continuous Infusions: . sodium chloride    . piperacillin-tazobactam (ZOSYN)  IV 3.375 g (04/17/17  1250)   PRN Meds:.acetaminophen **OR** acetaminophen, bisacodyl, hydrALAZINE, HYDROcodone-acetaminophen, levalbuterol, morphine injection, ondansetron **OR** ondansetron (ZOFRAN) IV, senna-docusate Medications Prior to Admission:  Prior to Admission medications   Medication Sig Start Date End Date Taking? Authorizing Provider  apixaban (ELIQUIS) 5 MG TABS tablet Take 1 tablet (5 mg total) by mouth 2 (two) times daily. 10/30/16  Yes Sherran Needs, NP  atorvastatin (LIPITOR) 40 MG tablet Take 40 mg by mouth every morning.   Yes [provider]  cholecalciferol (VITAMIN D) 1000 UNITS tablet Take 1,000 Units by mouth daily.   Yes [provider]  fentaNYL (DURAGESIC - DOSED MCG/HR) 25 MCG/HR patch Place 25 mcg onto the skin every 3 (three) days.   Yes [provider]  furosemide (LASIX) 20 MG tablet Take 20 mg by mouth daily.   Yes [provider]  lidocaine (LIDODERM) 5 % Place 1 patch onto the skin daily. TO BE APPLIED TO PAINFUL AREA ON THE BACK/Remove & Discard patch within 12 hours or as directed by MD   Yes [provider]  lisinopril (PRINIVIL,ZESTRIL) 20 MG tablet Take 0.5 tablets (10 mg total) by mouth daily. 03/07/17  Yes Hongalgi, Lenis Dickinson, MD  metFORMIN (GLUCOPHAGE) 500 MG tablet Take 500 mg by mouth 2 (two) times daily with a meal.   Yes [provider]  metoprolol succinate (TOPROL-XL) 50 MG 24 hr tablet Take 73m (1 and 1/2 tablets) twice a day by mouth Patient taking  differently: Take 75 mg by mouth 2 (two) times daily.  12/04/16  Yes CSherran Needs NP  Multiple Vitamin (MULTIVITAMIN WITH MINERALS) TABS tablet Take 1 tablet by mouth daily.   Yes [provider]  oxyCODONE-acetaminophen (PERCOCET) 7.5-325 MG tablet Take 2 tablets by mouth as needed for severe pain.   Yes [provider]  oxyCODONE-acetaminophen (PERCOCET) 7.5-325 MG tablet Take 1 tablet by mouth every 4 (four) hours as needed for moderate pain.   Yes [provider]  polyethylene glycol (MIRALAX / GLYCOLAX) packet Take 17 g by mouth 2 (two) times daily. Hold for diarrhae 03/07/17  Yes Hongalgi, ALenis Dickinson MD  senna (SENOKOT) 8.6 MG TABS tablet Take 2 tablets (17.2 mg total) by mouth at bedtime. 02/24/17  Yes RDebbe Odea MD  sertraline (ZOLOFT) 50 MG tablet Take 50 mg by mouth daily.   Yes [provider]  solifenacin (VESICARE) 5 MG tablet Take 5 mg by mouth daily.   Yes [provider]  acetaminophen (TYLENOL) 325 MG tablet Take 2 tablets (650 mg total) by mouth every 4 (four) hours as needed for mild pain or moderate pain (or Fever >/= 101). Patient not taking: Reported on 04/15/2017 03/07/17   HModena Jansky MD  bisacodyl (DULCOLAX) 10 MG suppository Place 1 suppository (10 mg total) rectally as needed for moderate constipation. 03/07/17   Hongalgi, ALenis Dickinson MD   No Known Allergies Review of Systems  Physical Exam  Constitutional: He appears well-developed and well-nourished. No distress.  Cardiovascular:  Tachycardic, irregular, distal pulses weak  Pulmonary/Chest:  Breath sounds diminished throughout, periods of apnea  Musculoskeletal: He exhibits edema.  Neurological:  nonresponsive  Skin: He is diaphoretic.  Nursing note and vitals reviewed.   Vital Signs: BP (!) 104/47 (BP Location: Left Arm)   Pulse (!) 104   Temp 98.4 F (36.9 C) (Oral)   Resp 18   Ht 5' 10"  (1.778 m)   Wt 119.7 kg (264 lb)   SpO2 97%   BMI 37.88  kg/m   Pain Assessment: No/denies pain   Pain Score: Asleep   SpO2: SpO2: 97 % O2 Device:SpO2: 97 % O2 Flow Rate: .O2 Flow Rate (L/min): 3 L/min  IO: Intake/output summary:  Intake/Output Summary (Last 24 hours) at 04/17/17 1617 Last data filed at 04/17/17 0900  Gross per 24 hour  Intake              670 ml  Output              600 ml  Net               70 ml    LBM: Last BM Date:  (uta) Baseline Weight: Weight: (!) 140.6 kg (310 lb) Most recent weight: Weight: 119.7 kg (264 lb)     Palliative Assessment/Data: PPS: 10%     Thank you for this consult. Palliative medicine will continue to follow and assist as needed.   Time In: 1430 Time Out: 1600 Time Total: 90 minutes Greater than 50%  of this time was spent counseling and coordinating care related to the above assessment and plan.  Signed by: Mariana Kaufman, AGNP-C Palliative Medicine    Please contact Palliative Medicine Team phone at [phone] for questions and concerns.  For individual provider: See Shea Evans

## 2017-04-18 DIAGNOSIS — J181 Lobar pneumonia, unspecified organism: Secondary | ICD-10-CM

## 2017-04-18 DIAGNOSIS — I10 Essential (primary) hypertension: Secondary | ICD-10-CM

## 2017-04-18 DIAGNOSIS — N179 Acute kidney failure, unspecified: Secondary | ICD-10-CM

## 2017-04-18 DIAGNOSIS — Z7189 Other specified counseling: Secondary | ICD-10-CM

## 2017-04-18 DIAGNOSIS — Z515 Encounter for palliative care: Secondary | ICD-10-CM

## 2017-04-18 DIAGNOSIS — J189 Pneumonia, unspecified organism: Secondary | ICD-10-CM

## 2017-04-18 MED ORDER — WHITE PETROLATUM GEL
Status: AC
  Administered 2017-04-18: 12:00:00
  Filled 2017-04-18: qty 1

## 2017-04-18 MED ORDER — MORPHINE BOLUS VIA INFUSION
2.0000 mg | INTRAVENOUS | Status: DC | PRN
  Administered 2017-04-18: 2 mg via INTRAVENOUS
  Filled 2017-04-18: qty 2

## 2017-04-18 NOTE — Progress Notes (Addendum)
Daily Progress Note   Patient Name: Alex Bowman       Date: 04/18/2017 DOB: January 19, 1946  Age: 71 y.o. MRN#: [id number] Attending Physician: Patrecia Pour, Christean Grief, MD Primary Care Physician: [name], MD Admit Date: 04/15/2017  Reason for Consultation/Follow-up: Establishing goals of care, Psychosocial/spiritual support and Terminal Care  Subjective: Patient in bed, actively dying. Family at bedside. Family reports some discomfort overnight and this morning requiring morphine boluses. Noted some moaning with expiration.  Review of Systems  Unable to perform ROS: Acuity of condition    Length of Stay: 1  Current Medications: Scheduled Meds:  . fentaNYL  25 mcg Transdermal Q72H    Continuous Infusions: . morphine 2 mg/hr (04/18/17 1215)    PRN Meds: glycopyrrolate **OR** glycopyrrolate **OR** glycopyrrolate, haloperidol **OR** haloperidol **OR** haloperidol lactate, morphine  Physical Exam  Constitutional: He appears well-developed.  Cardiovascular:  tachycardic  Pulmonary/Chest:  Apneic at times  Abdominal: Soft.  Musculoskeletal: He exhibits edema.  Neurological:  unresponsiv  Skin: Skin is warm.  Nursing note and vitals reviewed.           Vital Signs: BP (!) 104/47 (BP Location: Left Arm)   Pulse (!) 104   Temp 98.4 F (36.9 C) (Oral)   Resp 18   Ht 5\' 10"  (1.778 m)   Wt 119.7 kg (264 lb)   SpO2 97%   BMI 37.88 kg/m  SpO2: SpO2: 97 % O2 Device: O2 Device: Nasal Cannula O2 Flow Rate: O2 Flow Rate (L/min): 3 L/min  Intake/output summary:  Intake/Output Summary (Last 24 hours) at 04/18/17 1417 Last data filed at 04/18/17 0354  Gross per 24 hour  Intake                7 ml  Output              150 ml  Net             -143 ml   LBM: Last BM Date:   (uta) Baseline Weight: Weight: (!) 140.6 kg (310 lb) Most recent weight: Weight: 119.7 kg (264 lb)       Palliative Assessment/Data: PPS: 10%     Patient Active Problem List   Diagnosis Date Noted  . Pneumonia of upper lobe due to infectious organism ([place])   . Terminal care   .  Advance care planning   . Palliative care by specialist   . Goals of care, counseling/discussion   . SOB (shortness of breath)   . Pressure injury of skin 04/16/2017  . GERD (gastroesophageal reflux disease) 04/15/2017  . UTI (urinary tract infection) 04/15/2017  . Acute kidney injury superimposed on chronic kidney disease ([place]) 04/15/2017  . AKI (acute kidney injury) ([place]) 04/15/2017  . Diabetes mellitus with complication ([place])   . Sepsis ([place]) 03/04/2017  . Fall 03/04/2017  . Depression 03/04/2017  . Acute metabolic encephalopathy 79/89/2119  . Hypotension   . AF (paroxysmal atrial fibrillation) ([place]) 02/24/2017  . DM type 2 causing CKD stage 3 ([place]) 02/24/2017  . CKD (chronic kidney disease) stage 3, GFR 30-59 ml/min 02/24/2017  . Closed wedge compression fracture of T7 vertebra ([place]) 02/22/2017  . Anemia 02/22/2017  . Severe obesity (BMI >= 40) ([place]) 03/13/2013  . Dyslipidemia   . Hypertension   . Bilateral lower extremity edema   . Metabolic syndrome 41/74/0814    Palliative Care Assessment & Plan   Patient Profile:  71 y.o. male  with past medical history of afib, CKD3, HTN, DM3, morbid obesity, HTN, HLD, recurrent UTIs, multiple falls with compression fractures admitted from [facility] on 04/15/2017 with confusion and increasing weakness.  Workup during this admission has revealed sepsis UTI, aspiration pneumonia, hyperkalemia, acute on chronic kidney injury. Early in the morning of 9/6 he had a significant event of a fib with RVR and respiratory failure. Chest xray shows development of upper lobe infiltrates. He has been non responsive since. Palliative medicine consulted for possible end  of life care assistance.    Assessment/Recommendations/Plan   Will increase infusion rate to 2mg /hr based on last estimated 24hr requirements using last 12 hours documented use. Will increase bolus dose to 2mg  q 15 min for signs of discomfort or SOB.  Pt on O2 [place]- oxygen end-of-life is considered life-prolonging. Discussed with family that patient would likely not experience discomfort with discontinuing oxygen. Will discontinue oxygen and use opioids in diazepam for symptoms of shortness of breath.  Goals of Care and Additional Recommendations:  Limitations on Scope of Treatment: Full Comfort Care  Code Status:  DNR  Prognosis:   Hours - Days d/t sepsis r/t recurrent UTI, pneumonia, poor functional status, now transitioned to full comfort care.  Discharge Planning:  Anticipated Hospital Death  Care plan was discussed with patient's spouse and children.  Thank you for allowing the Palliative Medicine Team to assist in the care of this patient.   Time In: 0930 Time Out: 1005 Total Time 35 minutes Prolonged Time Billed No      Greater than 50%  of this time was spent counseling and coordinating care related to the above assessment and plan.  Mariana Kaufman, AGNP-C Palliative Medicine   Please contact Palliative Medicine Team phone at [phone] for questions and concerns.

## 2017-04-18 NOTE — Progress Notes (Addendum)
PROGRESS NOTE [facility]   Alex Bowman   LOV:[id number] DOB: [birth year]  DOA: 04/15/2017 PCP: [name], MD   Brief Narrative:  Alex Bowman is a 71 year old male with past medical history significant for CKD stage IV, hypertension, diabetes mellitus type 2, chronic back pain who presented to the emergency department after being sent by his rehabilitation facility with elevated potassium of 6.9 and a white blood cell count of 29,000. Labs were obtained that they prior to admission as patient didn't rehabilitation show mild confusion and increasing weakness. ED evaluation patient was found to have possible UTI, acute kidney injury and was admitted for IV antibiotics and IV hydration. Patient developed sepsis with low blood pressure. Went into respiratory distress requiring oxygen and aggressive IV fluids. Patient had littler to not respond to treatment and family decided to respect patient's wishes on being comfortable. Palliative care was consulted and decision was made to make patient comfort care.   Subjective: Patient seen and examined with family at bedside.He is responsive heavy breathing and having periods of apnea.  Assessment & Plan: Sepsis secondary to UTI UA grossly abnormal Urine culture growing Klebsiella pneumoniae Patient has had severe decline due to sepsis.  Patient was treated with IV abx, but was poorly responsive to treatment, patient has been transtioned to comfort care  Acute on chronic kidney disease stage IV No active issue as he is comfort measures only   Hyperkalemia - in view of renal failure Initially treated with IV fluids and Lasix. Given Kayexalate as well  No more blood draw   Type 2 diabetes mellitus - stable On oral metformin at home, which was held due to renal failure Monitor CBGs SSI A1c 5.4 well controlled  Hypertension - now hypotensive Comfort measures   A. Fib Went into A. Fib with RVR, Cardizem was given an IV metoprolol  heart rate now better controlled. On Eliquis for anticoagulation Continue to monitor  Chronic back pain due to compression fracture  Pain control as needed   DVT prophylaxis: Eliquis  Code Status: DO NOT RESUSCITATE Family Communication: None at bedside  Disposition Plan: comfort measures/Hospice ? Hospital death   Consultants:   none  Procedures:   none  Antimicrobials: Anti-infectives    Start     Dose/Rate Route Frequency Ordered Stop   04/19/17 0600  vancomycin (VANCOCIN) 1,750 mg in sodium chloride 0.9 % 500 mL IVPB  Status:  Discontinued     1,750 mg 250 mL/hr over 120 Minutes Intravenous Every 48 hours 04/17/17 0222 04/17/17 0807   04/17/17 0230  vancomycin (VANCOCIN) 2,000 mg in sodium chloride 0.9 % 500 mL IVPB     2,000 mg 250 mL/hr over 120 Minutes Intravenous  Once 04/17/17 0222 04/17/17 0517   04/17/17 0230  piperacillin-tazobactam (ZOSYN) IVPB 3.375 g  Status:  Discontinued     3.375 g 12.5 mL/hr over 240 Minutes Intravenous Every 8 hours 04/17/17 0222 04/17/17 1626   04/16/17 1400  cefTRIAXone (ROCEPHIN) 1 g in dextrose 5 % 50 mL IVPB  Status:  Discontinued     1 g 100 mL/hr over 30 Minutes Intravenous Every 24 hours 04/15/17 1627 04/17/17 0114   04/15/17 1300  cefTRIAXone (ROCEPHIN) 1 g in dextrose 5 % 50 mL IVPB     1 g 100 mL/hr over 30 Minutes Intravenous  Once 04/15/17 1246 04/15/17 1442     Objective: Vitals:   04/17/17 1251 04/17/17 1328 04/17/17 1328 04/18/17 1430  BP: (!) 75/57 (!) 104/47 [name])  104/47   Pulse: (!) 107 (!) 104 (!) 104 (!) 126  Resp:  18 18 10   Temp:  98.4 F (36.9 C) 98.4 F (36.9 C)   TempSrc:  Oral Oral   SpO2:  97%    Weight:      Height:        Intake/Output Summary (Last 24 hours) at 04/18/17 1620 Last data filed at 04/18/17 0354  Gross per 24 hour  Intake                7 ml  Output              150 ml  Net             -143 ml   Filed Weights   04/15/17 1129 04/15/17 1835 04/17/17 0544  Weight: (!) 140.6 kg  (310 lb) 130.4 kg (287 lb 8 oz) 119.7 kg (264 lb)    Examination:  General: Ill looking  Cardiovascular: Tachycardia, S1/S2 +, no rubs, no gallops Respiratory: Breast sounds diminished, apneic at times.  Abdominal: Soft, NT, ND, bowel sounds + Extremities: LE edema.   Data Reviewed: I have personally reviewed following labs and imaging studies  CBC:  Recent Labs Lab 04/15/17 1148 04/16/17 0617 04/17/17 0459  WBC 13.0* 11.1* 12.1*  NEUTROABS 9.0*  --  8.4*  HGB 11.0* 10.2* 10.1*  HCT 35.9* 33.8* 34.1*  MCV 88.2 88.7 89.7  PLT 209 226 008   Basic Metabolic Panel:  Recent Labs Lab 04/15/17 1148 04/16/17 0617 04/17/17 0459  NA 133* 136 137  K 5.8* 5.4* 6.2*  CL 98* 104 105  CO2 22 19* 21*  GLUCOSE 101* 89 96  BUN 92* 83* 85*  CREATININE 4.18* 3.54* 3.22*  CALCIUM 8.9 8.2* 8.6*  MG  --   --  1.6*   GFR: Estimated Creatinine Clearance: 27.3 mL/min (A) (by C-G formula based on SCr of 3.22 mg/dL (H)). Liver Function Tests:  Recent Labs Lab 04/15/17 1148 04/16/17 0617  AST 51* 35  ALT 39 31  ALKPHOS 138* 118  BILITOT 0.5 0.7  PROT 7.0 6.4*  ALBUMIN 2.5* 2.3*    Recent Labs Lab 04/15/17 1148  LIPASE 26   No results for input(s): AMMONIA in the last 168 hours. Coagulation Profile: No results for input(s): INR, PROTIME in the last 168 hours. Cardiac Enzymes: No results for input(s): CKTOTAL, CKMB, CKMBINDEX, TROPONINI in the last 168 hours. BNP (last 3 results) No results for input(s): PROBNP in the last 8760 hours. HbA1C:  Recent Labs  04/15/17 1637  HGBA1C 5.6   CBG:  Recent Labs Lab 04/16/17 0752 04/16/17 1205 04/16/17 1728 04/16/17 2102 04/17/17 0752  GLUCAP 83 111* 86 95 90   Lipid Profile: No results for input(s): CHOL, HDL, LDLCALC, TRIG, CHOLHDL, LDLDIRECT in the last 72 hours. Thyroid Function Tests: No results for input(s): TSH, T4TOTAL, FREET4, T3FREE, THYROIDAB in the last 72 hours. Anemia Panel: No results for input(s):  VITAMINB12, FOLATE, FERRITIN, TIBC, IRON, RETICCTPCT in the last 72 hours. Sepsis Labs:  Recent Labs Lab 04/15/17 1213 04/15/17 1654 04/17/17 0913  PROCALCITON  --   --  14.64  LATICACIDVEN 1.26 1.23  --     Recent Results (from the past 240 hour(s))  Urine culture     Status: Abnormal   Collection Time: 04/15/17 11:52 AM  Result Value Ref Range Status   Specimen Description URINE, RANDOM  Final   Special Requests NONE  Final   Culture >=100,000  COLONIES/mL KLEBSIELLA PNEUMONIAE (A)  Final   Report Status 04/17/2017 FINAL  Final   Organism ID, Bacteria KLEBSIELLA PNEUMONIAE (A)  Final      Susceptibility   Klebsiella pneumoniae - MIC*    AMPICILLIN >=32 RESISTANT Resistant     CEFAZOLIN <=4 SENSITIVE Sensitive     CEFTRIAXONE <=1 SENSITIVE Sensitive     CIPROFLOXACIN <=0.25 SENSITIVE Sensitive     GENTAMICIN <=1 SENSITIVE Sensitive     IMIPENEM <=0.25 SENSITIVE Sensitive     NITROFURANTOIN 64 INTERMEDIATE Intermediate     TRIMETH/SULFA <=20 SENSITIVE Sensitive     AMPICILLIN/SULBACTAM 8 SENSITIVE Sensitive     PIP/TAZO <=4 SENSITIVE Sensitive     Extended ESBL NEGATIVE Sensitive     * >=100,000 COLONIES/mL KLEBSIELLA PNEUMONIAE  Blood culture (routine x 2)     Status: None (Preliminary result)   Collection Time: 04/15/17  4:40 PM  Result Value Ref Range Status   Specimen Description BLOOD LEFT WRIST  Final   Special Requests   Final    BOTTLES DRAWN AEROBIC AND ANAEROBIC Blood Culture adequate volume   Culture NO GROWTH 2 DAYS  Final   Report Status PENDING  Incomplete  Blood culture (routine x 2)     Status: None (Preliminary result)   Collection Time: 04/15/17  4:48 PM  Result Value Ref Range Status   Specimen Description BLOOD LEFT ANTECUBITAL  Final   Special Requests   Final    BOTTLES DRAWN AEROBIC AND ANAEROBIC Blood Culture adequate volume   Culture NO GROWTH 2 DAYS  Final   Report Status PENDING  Incomplete  MRSA PCR Screening     Status: None   Collection  Time: 04/15/17  7:59 PM  Result Value Ref Range Status   MRSA by PCR NEGATIVE NEGATIVE Final    Comment:        The GeneXpert MRSA Assay (FDA approved for NASAL specimens only), is one component of a comprehensive MRSA colonization surveillance program. It is not intended to diagnose MRSA infection nor to guide or monitor treatment for MRSA infections.       Radiology Studies: Dg Chest Port 1 View  Result Date: 04/16/2017 CLINICAL DATA:  Shortness of breath EXAM: PORTABLE CHEST 1 VIEW COMPARISON:  04/15/2017 FINDINGS: Very low lung volumes. Increased perihilar opacity and right upper lobe focal opacity. No pleural effusion. Cardiomediastinal silhouette stable. No pneumothorax. IMPRESSION: Very low lung volumes. Development of perihilar and right upper lobe pulmonary infiltrates Electronically Signed   By: Donavan Foil M.D.   On: 04/16/2017 23:51     Scheduled Meds: . fentaNYL  25 mcg Transdermal Q72H   Continuous Infusions: . morphine 2 mg/hr (04/18/17 1215)     LOS: 1 day    Time spent: Total of 15 minutes spent with pt, greater than 50% of which was spent in discussion of  treatment, counseling and coordination of care    Chipper Oman, MD Pager: Text Page via [email or web address]   If 7PM-7AM, please contact night-coverage [email or web address] 04/18/2017, 4:20 PM

## 2017-04-19 MED ORDER — LORAZEPAM 2 MG/ML IJ SOLN: 0.5000 mg | INTRAMUSCULAR | Status: DC | PRN

## 2017-04-19 MED ORDER — SODIUM CHLORIDE 0.9 % IV SOLN
0.5000 mg/h | INTRAVENOUS | Status: DC
  Administered 2017-04-19: 0.5 mg/h via INTRAVENOUS
  Filled 2017-04-19: qty 5

## 2017-04-19 MED ORDER — LORAZEPAM 2 MG/ML IJ SOLN
0.5000 mg | INTRAMUSCULAR | Status: DC | PRN
  Administered 2017-04-20: 0.5 mg via INTRAVENOUS
  Filled 2017-04-19: qty 1

## 2017-04-19 MED ORDER — LORAZEPAM 2 MG/ML IJ SOLN: 0.5000 mg | INTRAMUSCULAR | Status: DC

## 2017-04-19 MED ORDER — HYDROMORPHONE BOLUS VIA INFUSION
0.5000 mg | INTRAVENOUS | Status: DC | PRN
  Administered 2017-04-20 (×4): 0.5 mg via INTRAVENOUS
  Filled 2017-04-19: qty 1

## 2017-04-19 NOTE — Progress Notes (Signed)
Daily Progress Note   Patient Name: Alex Bowman       Date: 04/19/2017 DOB: 01-31-1946  Age: 71 y.o. MRN#: [id number] Attending Physician: Patrecia Pour, Christean Grief, MD Primary Care Physician: [name], MD Admit Date: 04/15/2017  Reason for Consultation/Follow-up: Establishing goals of care, Psychosocial/spiritual support and Terminal Care  Subjective: Patient resting in bed, actively dying. His wife has noticed hiccups as well as jerking that makes patient open eyes widely and moan, this was witnessed by myself during afternoon visit.  Last creatinine was 3.22 on 9/6. Myoclonus due to metabolic derangements. Fentanyl patch discontinued. Morphine drip changed to Dilaudid drip with PRN boluses. Ativan added PRN .  Death appears imminent. Patient's wife does not want patient moved.  Review of Systems  Unable to perform ROS: Acuity of condition    Length of Stay: 2  Current Medications: Scheduled Meds:  . fentaNYL  25 mcg Transdermal Q72H    Continuous Infusions: . morphine 2 mg/hr (04/18/17 1215)    PRN Meds: glycopyrrolate **OR** glycopyrrolate **OR** glycopyrrolate, haloperidol **OR** haloperidol **OR** haloperidol lactate, morphine  Physical Exam  Constitutional: He appears well-developed.  HENT:  Head: Atraumatic.  Cardiovascular:  Warm and dry  Pulmonary/Chest:  Irregular breathing, Apneic at times  Abdominal: Soft.  Musculoskeletal: He exhibits edema.  Neurological:  unresponsiv  Skin: Skin is warm.  Nursing note and vitals reviewed.           Vital Signs: BP (!) 104/47 (BP Location: Left Arm)   Pulse (!) 126   Temp 98.4 F (36.9 C) (Oral)   Resp 10   Ht 5\' 10"  (1.778 m)   Wt 119.7 kg (264 lb)   SpO2 97%   BMI 37.88 kg/m  SpO2: SpO2: 97 % O2 Device: O2  Device: Nasal Cannula O2 Flow Rate: O2 Flow Rate (L/min): 3 L/min  Intake/output summary: No intake or output data in the 24 hours ending 04/19/17 0926 LBM: Last BM Date:  (uta) Baseline Weight: Weight: (!) 140.6 kg (310 lb) Most recent weight: Weight: 119.7 kg (264 lb)       Palliative Assessment/Data: PPS: 10%     Patient Active Problem List   Diagnosis Date Noted  . Pneumonia of upper lobe due to infectious organism ([place])   . Terminal care   . Advance care planning   .  Palliative care by specialist   . Goals of care, counseling/discussion   . Acute kidney injury ([place])   . SOB (shortness of breath)   . Pressure injury of skin 04/16/2017  . GERD (gastroesophageal reflux disease) 04/15/2017  . UTI (urinary tract infection) 04/15/2017  . Acute kidney injury superimposed on chronic kidney disease ([place]) 04/15/2017  . AKI (acute kidney injury) ([place]) 04/15/2017  . Diabetes mellitus with complication ([place])   . Sepsis ([place]) 03/04/2017  . Fall 03/04/2017  . Depression 03/04/2017  . Acute metabolic encephalopathy 00/92/3300  . Hypotension   . AF (paroxysmal atrial fibrillation) ([place]) 02/24/2017  . DM type 2 causing CKD stage 3 ([place]) 02/24/2017  . CKD (chronic kidney disease) stage 3, GFR 30-59 ml/min 02/24/2017  . Closed wedge compression fracture of T7 vertebra ([place]) 02/22/2017  . Anemia 02/22/2017  . Severe obesity (BMI >= 40) ([place]) 03/13/2013  . Dyslipidemia   . Hypertension   . Bilateral lower extremity edema   . Metabolic syndrome 76/22/6333    Palliative Care Assessment & Plan   Patient Profile:  71 y.o. male  with past medical history of afib, CKD3, HTN, DM3, morbid obesity, HTN, HLD, recurrent UTIs, multiple falls with compression fractures admitted from [facility] on 04/15/2017 with confusion and increasing weakness.  Workup during this admission has revealed sepsis UTI, aspiration pneumonia, hyperkalemia, acute on chronic kidney injury. Early in the morning of 9/6  he had a significant event of a fib with RVR and respiratory failure. Chest xray shows development of upper lobe infiltrates. He has been non responsive since. Palliative medicine consulted for possible end of life care assistance.    Assessment/Recommendations/Plan   Morphine changed to Dilaudid. Ativan added.    O2 by [place]  Goals of Care and Additional Recommendations:  Limitations on Scope of Treatment: Full Comfort Care  Code Status:  DNR  Prognosis:   Hours - Days d/t sepsis r/t recurrent UTI, pneumonia, poor functional status, now transitioned to full comfort care.  Discharge Planning:  Anticipated Hospital Death  Care plan was discussed with patient's spouse and son.  Thank you for allowing the Palliative Medicine Team to assist in the care of this patient.   Total Time 60 minutes Prolonged Time Billed No      Greater than 50%  of this time was spent counseling and coordinating care related to the above assessment and plan.  Asencion Gowda- NP Palliative Medicine   Please contact Palliative Medicine Team phone at [phone] for questions and concerns.

## 2017-04-19 NOTE — Progress Notes (Signed)
[name]  PROGRESS NOTE [facility]   Alex Bowman   LNL:[id number] DOB: 1945-10-27  DOA: 04/15/2017 PCP: [name], MD   Brief Narrative:  [name] is a 71 year old male with past medical history significant for CKD stage IV, hypertension, diabetes mellitus type 2, chronic back pain who presented to the emergency department after being sent by his rehabilitation facility with elevated potassium of 6.9 and a white blood cell count of 29,000. Labs were obtained that they prior to admission as patient didn't rehabilitation show mild confusion and increasing weakness. ED evaluation patient was found to have possible UTI, acute kidney injury and was admitted for IV antibiotics and IV hydration. Patient developed sepsis with low blood pressure. Went into respiratory distress requiring oxygen and aggressive IV fluids. Patient had littler to not respond to treatment and family decided to respect patient's wishes on being comfortable. Palliative care was consulted and decision was made to make patient comfort care.   Subjective: No changes, remains unresponsive, family support given. Full comfort care.   Assessment & Plan: Sepsis secondary to UTI UA grossly abnormal Urine culture growing Klebsiella pneumoniae Patient has had severe decline due to sepsis.  Patient was treated with IV abx, but was poorly responsive to treatment, patient has been transtioned to comfort care  Acute on chronic kidney disease stage IV No active issue as he is comfort measures only   Hyperkalemia - in view of renal failure Initially treated with IV fluids and Lasix. Given Kayexalate as well  No more blood draw   Type 2 diabetes mellitus - stable On oral metformin at home, which was held due to renal failure Monitor CBGs SSI A1c 5.4 well controlled  Hypertension - now hypotensive Comfort measures   A. Fib Went into A. Fib with RVR, Cardizem was given an IV metoprolol heart rate now better  controlled. On Eliquis for anticoagulation Continue to monitor  Chronic back pain due to compression fracture  Pain control as needed  DVT prophylaxis: Eliquis  Code Status: DO NOT RESUSCITATE Family Communication: None at bedside  Disposition Plan: Hospital death vs hospital in AM   Consultants:   none  Procedures:   none  Antimicrobials: Anti-infectives    Start     Dose/Rate Route Frequency Ordered Stop   04/19/17 0600  vancomycin (VANCOCIN) 1,750 mg in sodium chloride 0.9 % 500 mL IVPB  Status:  Discontinued     1,750 mg 250 mL/hr over 120 Minutes Intravenous Every 48 hours 04/17/17 0222 04/17/17 0807   04/17/17 0230  vancomycin (VANCOCIN) 2,000 mg in sodium chloride 0.9 % 500 mL IVPB     2,000 mg 250 mL/hr over 120 Minutes Intravenous  Once 04/17/17 0222 04/17/17 0517   04/17/17 0230  piperacillin-tazobactam (ZOSYN) IVPB 3.375 g  Status:  Discontinued     3.375 g 12.5 mL/hr over 240 Minutes Intravenous Every 8 hours 04/17/17 0222 04/17/17 1626   04/16/17 1400  cefTRIAXone (ROCEPHIN) 1 g in dextrose 5 % 50 mL IVPB  Status:  Discontinued     1 g 100 mL/hr over 30 Minutes Intravenous Every 24 hours 04/15/17 1627 04/17/17 0114   04/15/17 1300  cefTRIAXone (ROCEPHIN) 1 g in dextrose 5 % 50 mL IVPB     1 g 100 mL/hr over 30 Minutes Intravenous  Once 04/15/17 1246 04/15/17 1442     Objective: Vitals:   04/17/17 1251 04/17/17 1328 04/17/17 1328 04/18/17 1430  BP: (!) 75/57 (!) 104/47 (!) 104/47   Pulse: [name])  107 (!) 104 (!) 104 (!) 126  Resp:  18 18 10   Temp:  98.4 F (36.9 C) 98.4 F (36.9 C)   TempSrc:  Oral Oral   SpO2:  97%    Weight:      Height:       No intake or output data in the 24 hours ending 04/19/17 1609 Filed Weights   04/15/17 1129 04/15/17 1835 04/17/17 0544  Weight: (!) 140.6 kg (310 lb) 130.4 kg (287 lb 8 oz) 119.7 kg (264 lb)    Examination:  General: very ill looking  Cardiovascular: Tachycardia  Respiratory: Breast sounds remain  diminished  Extremities: Edematous in all extremities    Data Reviewed: I have personally reviewed following labs and imaging studies  CBC:  Recent Labs Lab 04/15/17 1148 04/16/17 0617 04/17/17 0459  WBC 13.0* 11.1* 12.1*  NEUTROABS 9.0*  --  8.4*  HGB 11.0* 10.2* 10.1*  HCT 35.9* 33.8* 34.1*  MCV 88.2 88.7 89.7  PLT 209 226 016   Basic Metabolic Panel:  Recent Labs Lab 04/15/17 1148 04/16/17 0617 04/17/17 0459  NA 133* 136 137  K 5.8* 5.4* 6.2*  CL 98* 104 105  CO2 22 19* 21*  GLUCOSE 101* 89 96  BUN 92* 83* 85*  CREATININE 4.18* 3.54* 3.22*  CALCIUM 8.9 8.2* 8.6*  MG  --   --  1.6*   GFR: Estimated Creatinine Clearance: 27.3 mL/min (A) (by C-G formula based on SCr of 3.22 mg/dL (H)). Liver Function Tests:  Recent Labs Lab 04/15/17 1148 04/16/17 0617  AST 51* 35  ALT 39 31  ALKPHOS 138* 118  BILITOT 0.5 0.7  PROT 7.0 6.4*  ALBUMIN 2.5* 2.3*    Recent Labs Lab 04/15/17 1148  LIPASE 26   No results for input(s): AMMONIA in the last 168 hours. Coagulation Profile: No results for input(s): INR, PROTIME in the last 168 hours. Cardiac Enzymes: No results for input(s): CKTOTAL, CKMB, CKMBINDEX, TROPONINI in the last 168 hours. BNP (last 3 results) No results for input(s): PROBNP in the last 8760 hours. HbA1C: No results for input(s): HGBA1C in the last 72 hours. CBG:  Recent Labs Lab 04/16/17 0752 04/16/17 1205 04/16/17 1728 04/16/17 2102 04/17/17 0752  GLUCAP 83 111* 86 95 90   Lipid Profile: No results for input(s): CHOL, HDL, LDLCALC, TRIG, CHOLHDL, LDLDIRECT in the last 72 hours. Thyroid Function Tests: No results for input(s): TSH, T4TOTAL, FREET4, T3FREE, THYROIDAB in the last 72 hours. Anemia Panel: No results for input(s): VITAMINB12, FOLATE, FERRITIN, TIBC, IRON, RETICCTPCT in the last 72 hours. Sepsis Labs:  Recent Labs Lab 04/15/17 1213 04/15/17 1654 04/17/17 0913  PROCALCITON  --   --  14.64  LATICACIDVEN 1.26 1.23  --      Recent Results (from the past 240 hour(s))  Urine culture     Status: Abnormal   Collection Time: 04/15/17 11:52 AM  Result Value Ref Range Status   Specimen Description URINE, RANDOM  Final   Special Requests NONE  Final   Culture >=100,000 COLONIES/mL KLEBSIELLA PNEUMONIAE (A)  Final   Report Status 04/17/2017 FINAL  Final   Organism ID, Bacteria KLEBSIELLA PNEUMONIAE (A)  Final      Susceptibility   Klebsiella pneumoniae - MIC*    AMPICILLIN >=32 RESISTANT Resistant     CEFAZOLIN <=4 SENSITIVE Sensitive     CEFTRIAXONE <=1 SENSITIVE Sensitive     CIPROFLOXACIN <=0.25 SENSITIVE Sensitive     GENTAMICIN <=1 SENSITIVE Sensitive  IMIPENEM <=0.25 SENSITIVE Sensitive     NITROFURANTOIN 64 INTERMEDIATE Intermediate     TRIMETH/SULFA <=20 SENSITIVE Sensitive     AMPICILLIN/SULBACTAM 8 SENSITIVE Sensitive     PIP/TAZO <=4 SENSITIVE Sensitive     Extended ESBL NEGATIVE Sensitive     * >=100,000 COLONIES/mL KLEBSIELLA PNEUMONIAE  Blood culture (routine x 2)     Status: None (Preliminary result)   Collection Time: 04/15/17  4:40 PM  Result Value Ref Range Status   Specimen Description BLOOD LEFT WRIST  Final   Special Requests   Final    BOTTLES DRAWN AEROBIC AND ANAEROBIC Blood Culture adequate volume   Culture NO GROWTH 4 DAYS  Final   Report Status PENDING  Incomplete  Blood culture (routine x 2)     Status: None (Preliminary result)   Collection Time: 04/15/17  4:48 PM  Result Value Ref Range Status   Specimen Description BLOOD LEFT ANTECUBITAL  Final   Special Requests   Final    BOTTLES DRAWN AEROBIC AND ANAEROBIC Blood Culture adequate volume   Culture NO GROWTH 4 DAYS  Final   Report Status PENDING  Incomplete  MRSA PCR Screening     Status: None   Collection Time: 04/15/17  7:59 PM  Result Value Ref Range Status   MRSA by PCR NEGATIVE NEGATIVE Final    Comment:        The GeneXpert MRSA Assay (FDA approved for NASAL specimens only), is one component of  a comprehensive MRSA colonization surveillance program. It is not intended to diagnose MRSA infection nor to guide or monitor treatment for MRSA infections.       Radiology Studies: No results found.   Scheduled Meds: . fentaNYL  25 mcg Transdermal Q72H   Continuous Infusions: . morphine 2 mg/hr (04/18/17 1215)     LOS: 2 days    Time spent: Total of 15 minutes spent with pt, greater than 50% of which was spent in discussion of  treatment, counseling and coordination of care    Chipper Oman, MD Pager: Text Page via [email or web address]   If 7PM-7AM, please contact night-coverage [email or web address] 04/19/2017, 4:09 PM

## 2017-04-20 LAB — CULTURE, BLOOD (ROUTINE X 2)
CULTURE: NO GROWTH
CULTURE: NO GROWTH
Special Requests: ADEQUATE
Special Requests: ADEQUATE

## 2017-04-20 NOTE — Progress Notes (Signed)
Patient is discharge to hospice. Iv removed  And report call.

## 2017-04-20 NOTE — Clinical Social Work Note (Signed)
Hospice referral consult received, family selected [facility], [facility] nurse has received referral, no bed available at this time. CSW will continue to follow for DC planning.  Laveyah Oriol B. Joline Maxcy Clinical Social Work Dept Weekend Social Worker [phone] 12:18 PM

## 2017-04-20 NOTE — Progress Notes (Signed)
Daily Progress Note   Patient Name: Alex Bowman       Date: 04/20/2017 DOB: [birth year]  Age: 71 y.o. MRN#: [id number] Attending Physician: Tawni Millers Primary Care Physician: [name], MD Admit Date: 04/15/2017  Reason for Consultation/Follow-up: Establishing goals of care, Psychosocial/spiritual support and Terminal Care  Subjective: Patient resting in bed, actively dying. Myoclonus has ceased since change in medication regimen. Dilaudid drip with PRN boluses. Ativan PRN. Respirations shallow and irregular, unresponsive.  Patient's wife is still concerned about leaving hospital, but is now willing to allow transfer to [facility] only.    Review of Systems  Unable to perform ROS: Acuity of condition    Length of Stay: 3  Current Medications: Scheduled Meds:    Continuous Infusions: . HYDROmorphone 0.5 mg/hr (04/19/17 1841)    PRN Meds: glycopyrrolate **OR** glycopyrrolate **OR** glycopyrrolate, haloperidol **OR** haloperidol **OR** haloperidol lactate, HYDROmorphone, LORazepam, morphine  Physical Exam  Constitutional: He appears well-developed.  HENT:  Head: Atraumatic.  Cardiovascular:  Warm and dry  Pulmonary/Chest:  Irregular breathing, Apneic at times  Abdominal: Soft.  Musculoskeletal: He exhibits edema.  Neurological:  unresponsive  Skin: Skin is warm.  Nursing note and vitals reviewed.           Vital Signs: BP (!) 92/51 (BP Location: Right Wrist)   Pulse (!) 129   Temp 98.6 F (37 C) (Oral)   Resp (!) 4   Ht 5\' 10"  (1.778 m)   Wt 119.7 kg (264 lb)   SpO2 98%   BMI 37.88 kg/m  SpO2: SpO2: 98 % O2 Device: O2 Device: Nasal Cannula O2 Flow Rate: O2 Flow Rate (L/min): 3 L/min  Intake/output summary: No intake or output data in the 24  hours ending 04/20/17 0810 LBM: Last BM Date:  (uta) Baseline Weight: Weight: (!) 140.6 kg (310 lb) Most recent weight: Weight: 119.7 kg (264 lb)       Palliative Assessment/Data: PPS: 10%     Patient Active Problem List   Diagnosis Date Noted  . Pneumonia of upper lobe due to infectious organism ([place])   . Terminal care   . Advance care planning   . Palliative care by specialist   . Goals of care, counseling/discussion   . Acute kidney injury ([place])   . SOB (shortness of breath)   .  Pressure injury of skin 04/16/2017  . GERD (gastroesophageal reflux disease) 04/15/2017  . UTI (urinary tract infection) 04/15/2017  . Acute kidney injury superimposed on chronic kidney disease ([place]) 04/15/2017  . AKI (acute kidney injury) ([place]) 04/15/2017  . Diabetes mellitus with complication ([place])   . Sepsis ([place]) 03/04/2017  . Fall 03/04/2017  . Depression 03/04/2017  . Acute metabolic encephalopathy 07/61/5183  . Hypotension   . AF (paroxysmal atrial fibrillation) ([place]) 02/24/2017  . DM type 2 causing CKD stage 3 ([place]) 02/24/2017  . CKD (chronic kidney disease) stage 3, GFR 30-59 ml/min 02/24/2017  . Closed wedge compression fracture of T7 vertebra ([place]) 02/22/2017  . Anemia 02/22/2017  . Severe obesity (BMI >= 40) ([place]) 03/13/2013  . Dyslipidemia   . Hypertension   . Bilateral lower extremity edema   . Metabolic syndrome 43/73/5789    Palliative Care Assessment & Plan   Patient Profile:  71 y.o. male  with past medical history of afib, CKD3, HTN, DM3, morbid obesity, HTN, HLD, recurrent UTIs, multiple falls with compression fractures admitted from [facility] on 04/15/2017 with confusion and increasing weakness.  Workup during this admission has revealed sepsis UTI, aspiration pneumonia, hyperkalemia, acute on chronic kidney injury. Early in the morning of 9/6 he had a significant event of a fib with RVR and respiratory failure. Chest xray shows development of upper lobe infiltrates. He  has been non responsive since. Palliative medicine consulted for possible end of life care assistance.    Assessment/Recommendations/Plan Morphine discontinued yesterday due to myoclonus and hiccoughs.   Continue Dilaudid 0.5mg  continuous infusion with Dilaudid PRN bolus 0.5mg  q 23min PRN for pain and dyspnea   Ativan 0.5-1mg  PRN for myoclonus.   Nasal cannula in place.  Goals of Care and Additional Recommendations:  Limitations on Scope of Treatment: Full Comfort Care  Code Status:  DNR  Prognosis:   Hours - Days d/t sepsis r/t recurrent UTI, renal failure,  pneumonia, poor functional status,  full comfort care.  Discharge Planning:  Anticipated Hospital Death. Patient's wife amenable to transfer to [facility] only. [place] liaison, no beds available at this time.   Care plan was discussed with patient's spouse, son, and daughter.  Thank you for allowing the Palliative Medicine Team to assist in the care of this patient.   Total Time 50 minutes Prolonged Time Billed No      Greater than 50%  of this time was spent counseling and coordinating care related to the above assessment and plan.  Asencion Gowda- NP Palliative Medicine   Please contact Palliative Medicine Team phone at [phone] for questions and concerns.

## 2017-04-20 NOTE — Progress Notes (Signed)
1100--[facility] [place]---[facility] RN Note  Received request from Asencion Gowda, PMT for family interest in [facility]. [place], CSW to inform of referral. Chart reviewed. Unfortunately [facility] is not able to offer a bed at this time. [place] and CSW are aware. [facility] liaison will follow up with CSW and family tomorrow or sooner if room becomes available.   Please feel free to call with any hospice related questions or concerns.  Thank you for this referral.  Margaretmary Eddy, RN, [facility] Liaison [phone]

## 2017-04-20 NOTE — Clinical Social Work Note (Signed)
Clinical Social Worker facilitated patient discharge including contacting patient family and facility to confirm patient discharge plans.  Clinical information faxed to facility and family agreeable with plan.  CSW arranged ambulance transport via [facility] to [facility]. RN to call report prior to discharge.  Clinical Social Worker will sign off for now as social work intervention is no longer needed. Please consult [place] again if new need arises.  Doyne Micke B. Joline Maxcy Clinical Social Work Dept Weekend Social Worker [phone] 4:51 PM

## 2017-04-20 NOTE — Progress Notes (Signed)
1455--[facility] [place]--[facility] RN note  [facility] bed now available. Met with wife [name] and son Legrand [place] to confirm interest and explain services. Family agreeable to transfer today. Wilburn Cornelia, [place] aware. Registration paper work completed. Dr. Orpah Melter to assume care for patient per family request as family prefers hospice physician for end of life specialty care.   [facility] was called at 1513 for transport.  Discharge summary faxed to [phone].  RN please call report to [phone].  Thank you, Margaretmary Eddy, RN, [facility] Liaison [phone]  [place] are on [facility].

## 2017-04-20 NOTE — Discharge Summary (Signed)
Physician Discharge Summary  Alex Bowman UXN:[id number] DOB: Feb 10, 1946 DOA: 04/15/2017  PCP: [name], MD  Admit date: 04/15/2017 Discharge date: 04/20/2017  Admitted From: Home Disposition:  Hospice   Recommendations for Outpatient Follow-up:  1. Patient is discharged on comfort measures.  Home Health: NA Equipment/Devices:NA   Discharge Condition: Stable CODE STATUS: DNR Diet recommendation: As tolerated   Brief/Interim Summary: 71 year old male who presented to hospital with chief complaint of abnormal labs. Patient is known to have chronic kidney disease, hypertension, dyslipidemia, diabetes mellitus type II, depression, and chronic back pain. Patient developed acute confusion and worsening generalized weakness, poor appetite and significant weight loss. Patient was evaluated as an outpatient with blood work, showing potassium 6.9 and white cell count 29,000. On his initial physical examination blood pressure 98/66, heart rate 119, temperature 97.5, respiratory rate 16, oxygen saturation 98%. Moist mucous membranes, lungs were clear to auscultation bilaterally, heart S1-S2 present irregularly irregular no gallops, rubs or murmurs, abdomen was soft nontender, trace lower extremity edema.   Patient was admitted to the hospital working diagnosis of sepsis due to urinary tract infection, complicated by acute kidney injury with hyperkalemia. Patient did not respond to medical therapy, developed worsening lethargy, volume overload with respiratory distress. Considering the patient's poor prognosis, and progressive decline in patient's health, his family decided to continue care under comfort measures  1. Sepsis due to urinary tract infection, due to Klebsiella pneumoniae, present on admission, complicated  with volume overload and right upper lobe aspiration pneumonia. Patient with gram negative sepsis, placed on broad spectrum antibiotic therapy and IV fluids. He did not respond  appropriately, worsening rapid deconditioning, considering the patient's comorbidities, patient's family decided to proceed with comfort measures. Antibiotics have been discontinued. Patient was continued on supplemental oxygen per nasal cannula, placed on hydromorphone continuous infusion. Over last 24 hours patient has been stable, comfortable, no signs of acute distress. His family is at the bedside.   2. AKI on ckd stage IV, with hyperkalemia. Patient had worsening potassium, persistent renal failure, uremia, patient was placed comfort measures.   3. T2DM. Documented capillary glucose 86, 95 and 90, risk of hypoglycemia.  4. HTN. Off antihypertensive agents  5. Uncontrolled chronic atrial fibrillation. Off rate controlling agents.  Discharge Diagnoses:  Active Problems:   Closed wedge compression fracture of T7 vertebra (HCC)   Anemia   AF (paroxysmal atrial fibrillation) (HCC)   DM type 2 causing CKD stage 3 (HCC)   CKD (chronic kidney disease) stage 3, GFR 30-59 ml/min   Sepsis ([place])   Acute metabolic encephalopathy   GERD (gastroesophageal reflux disease)   UTI (urinary tract infection)   Acute kidney injury superimposed on chronic kidney disease (HCC)   AKI (acute kidney injury) (HCC)   Pressure injury of skin   SOB (shortness of breath)   Pneumonia of upper lobe due to infectious organism [facility])   Terminal care   Advance care planning   Palliative care by specialist   Goals of care, counseling/discussion   Acute kidney injury [facility])    Discharge Instructions   Allergies as of 04/20/2017   No Known Allergies     Medication List    STOP taking these medications   acetaminophen 325 MG tablet Commonly known as:  TYLENOL   apixaban 5 MG Tabs tablet Commonly known as:  ELIQUIS   atorvastatin 40 MG tablet Commonly known as:  LIPITOR   bisacodyl 10 MG suppository Commonly known as:  DULCOLAX   cholecalciferol 1000 units  tablet Commonly known as:  VITAMIN D    fentaNYL 25 MCG/HR patch Commonly known as:  DURAGESIC - dosed mcg/hr   furosemide 20 MG tablet Commonly known as:  LASIX   LIDODERM 5 % Generic drug:  lidocaine   lisinopril 20 MG tablet Commonly known as:  PRINIVIL,ZESTRIL   metFORMIN 500 MG tablet Commonly known as:  GLUCOPHAGE   metoprolol succinate 50 MG 24 hr tablet Commonly known as:  TOPROL-XL   multivitamin with minerals Tabs tablet   oxyCODONE-acetaminophen 7.5-325 MG tablet Commonly known as:  PERCOCET   polyethylene glycol packet Commonly known as:  MIRALAX / GLYCOLAX   senna 8.6 MG Tabs tablet Commonly known as:  SENOKOT   sertraline 50 MG tablet Commonly known as:  ZOLOFT   solifenacin 5 MG tablet Commonly known as:  VESICARE            Discharge Care Instructions        Start     Ordered   04/20/17 0000  Increase activity slowly     04/20/17 1524   04/20/17 0000  Diet - low sodium heart healthy     04/20/17 1524   04/20/17 0000  Discharge instructions    Comments:  Follow up with hospice care.   04/20/17 1524     Follow-up Information    [name], MD.   Specialty:  Family Medicine Contact information: [place] [id number] [phone]          No Known Allergies  Consultations:  Palliative care   Procedures/Studies: Dg Chest Port 1 View  Result Date: 04/16/2017 CLINICAL DATA:  Shortness of breath EXAM: PORTABLE CHEST 1 VIEW COMPARISON:  04/15/2017 FINDINGS: Very low lung volumes. Increased perihilar opacity and right upper lobe focal opacity. No pleural effusion. Cardiomediastinal silhouette stable. No pneumothorax. IMPRESSION: Very low lung volumes. Development of perihilar and right upper lobe pulmonary infiltrates Electronically Signed   By: Donavan Foil M.D.   On: 04/16/2017 23:51   Dg Chest Portable 1 View  Result Date: 04/15/2017 CLINICAL DATA:  Weakness, chronic back pain EXAM: PORTABLE CHEST 1 VIEW COMPARISON:  03/04/2017 FINDINGS: The  heart size and mediastinal contours are within normal limits. Both lungs are clear. The visualized skeletal structures are unremarkable. IMPRESSION: No active disease. Electronically Signed   By: Kathreen Devoid   On: 04/15/2017 12:27   Ct Renal Stone Study  Addendum Date: 04/15/2017   ADDENDUM REPORT: 04/15/2017 14:23 ADDENDUM: Distended gallbladder with subtle pericholecystic edema raises the question of cholecystitis. Abdominal ultrasound may prove helpful to further evaluate. I personally discussed this addendum with Dr. Venora Maples at 1423 hours on 04/15/2017. Electronically Signed   By: Misty Stanley M.D.   On: 04/15/2017 14:23   Result Date: 04/15/2017 CLINICAL DATA:  Bilateral low back pain. EXAM: CT ABDOMEN AND PELVIS WITHOUT CONTRAST TECHNIQUE: Multidetector CT imaging of the abdomen and pelvis was performed following the standard protocol without IV contrast. COMPARISON:  03/04/2017 FINDINGS: Lower chest: Dependent atelectasis noted bilaterally. Marked T7 compression fracture noted as seen on prior CT scan. Hepatobiliary: No focal abnormality in the liver on this study without intravenous contrast. Gallbladder is distended and there is subtle pericholecystic edema. No intrahepatic or extrahepatic biliary dilation. Pancreas: No focal mass lesion. No dilatation of the main duct. No intraparenchymal cyst. No peripancreatic edema. Spleen: No splenomegaly. No focal mass lesion. Adrenals/Urinary Tract: No adrenal nodule or mass. Tiny 2 mm nonobstructing stones identified interpolar right kidney. No left renal  stones. No evidence for hydroureter. The urinary bladder appears normal for the degree of distention. Stomach/Bowel: Stomach is nondistended. No gastric wall thickening. No evidence of outlet obstruction. Duodenum is normally positioned as is the ligament of Treitz. No small bowel wall thickening. No small bowel dilatation. The terminal ileum is normal. The appendix is normal. No gross colonic mass. No colonic  wall thickening. No substantial diverticular change. Vascular/Lymphatic: There is abdominal aortic atherosclerosis without aneurysm. There is no gastrohepatic or hepatoduodenal ligament lymphadenopathy. No intraperitoneal or retroperitoneal lymphadenopathy. No pelvic sidewall lymphadenopathy. Reproductive: The prostate gland and seminal vesicles have normal imaging features. Other: No intraperitoneal free fluid. Musculoskeletal: 4.6 x 4.6 cm lesion identified in the inferior aspect of the right gluteus musculature (see image 100 series 4). T7 compression fracture again noted. IMPRESSION: 1. Nonobstructing tiny right renal stones. No secondary changes in either kidney or ureter. 2. 4.6 cm mass lesion inferior right gluteus musculature similar to prior. This is probably a hematoma and is likely amenable to clinical inspection. 3.  Aortic Atherosclerois (ICD10-170.0) 4. T7 compression fracture as evaluated on prior thoracic spine CT. Electronically Signed: By: Misty Stanley M.D. On: 04/15/2017 14:14   US Abdomen Limited Ruq  Result Date: 04/15/2017 CLINICAL DATA:  Abdominal pain. EXAM: ULTRASOUND ABDOMEN LIMITED RIGHT UPPER QUADRANT COMPARISON:  None. FINDINGS: Gallbladder: No evidence for gallstones. Gallbladder wall thickness is increased measuring nearly 4 mm. No evidence for pericholecystic fluid. Layering sludge evident within the lumen. Sonographer reports no sonographic Murphy sign. Common bile duct: Diameter: 5 mm.  Nondilated. Liver: Coarsening of the echotexture suggests fatty deposition. No focal abnormality. No intrahepatic biliary duct dilatation. Portal vein is patent on color Doppler imaging with normal direction of blood flow towards the liver. IMPRESSION: Although no gallstones are evident, the gallbladder wall is mildly thickened at 4 mm. No pericholecystic fluid in the sonographer reports no sonographic Murphy sign. Acalculous cholecystitis could be considered in the appropriate clinical setting.  If there is concern for cystic duct obstruction, nuclear scintigraphy may prove helpful to further evaluate. Electronically Signed   By: Misty Stanley M.D.   On: 04/15/2017 15:08       Subjective: Patient sedated, not responsive to touch or voice, all information from patient's family at the bedside. No apparent pain or dyspnea.   Discharge Exam: Vitals:   04/19/17 2100 04/19/17 2300  BP:  (!) 92/51  Pulse:  (!) 129  Resp: (!) 6 (!) 4  Temp:  98.6 F (37 C)  SpO2:  98%   Vitals:   04/17/17 1328 04/18/17 1430 04/19/17 2100 04/19/17 2300  BP: (!) 104/47   (!) 92/51  Pulse: (!) 104 (!) 126  (!) 129  Resp: 18 10 (!) 6 (!) 4  Temp: 98.4 F (36.9 C)   98.6 F (37 C)  TempSrc: Oral   Oral  SpO2:    98%  Weight:      Height:        General: deconditioned Neurology: Awake and alert, non focal  E ENT: positive pallor, no icterus, oral mucosa dry Cardiovascular: S1-S2 present, rhythmic, no gallops, rubs, or murmurs. No jugular venous distention, ++  lower extremity edema. Pulmonary: decreased breath sounds bilaterally, decreased air movement, no wheezing, rhonchi, bibasilar rales. Gastrointestinal. Abdomen flat, no organomegaly, non tender, no rebound or guarding Skin. No rashes Musculoskeletal: no joint deformities    The results of significant diagnostics from this hospitalization (including imaging, microbiology, ancillary and laboratory) are listed below for reference.  Microbiology: Recent Results (from the past 240 hour(s))  Urine culture     Status: Abnormal   Collection Time: 04/15/17 11:52 AM  Result Value Ref Range Status   Specimen Description URINE, RANDOM  Final   Special Requests NONE  Final   Culture >=100,000 COLONIES/mL KLEBSIELLA PNEUMONIAE (A)  Final   Report Status 04/17/2017 FINAL  Final   Organism ID, Bacteria KLEBSIELLA PNEUMONIAE (A)  Final      Susceptibility   Klebsiella pneumoniae - MIC*    AMPICILLIN >=32 RESISTANT Resistant      CEFAZOLIN <=4 SENSITIVE Sensitive     CEFTRIAXONE <=1 SENSITIVE Sensitive     CIPROFLOXACIN <=0.25 SENSITIVE Sensitive     GENTAMICIN <=1 SENSITIVE Sensitive     IMIPENEM <=0.25 SENSITIVE Sensitive     NITROFURANTOIN 64 INTERMEDIATE Intermediate     TRIMETH/SULFA <=20 SENSITIVE Sensitive     AMPICILLIN/SULBACTAM 8 SENSITIVE Sensitive     PIP/TAZO <=4 SENSITIVE Sensitive     Extended ESBL NEGATIVE Sensitive     * >=100,000 COLONIES/mL KLEBSIELLA PNEUMONIAE  Blood culture (routine x 2)     Status: None   Collection Time: 04/15/17  4:40 PM  Result Value Ref Range Status   Specimen Description BLOOD LEFT WRIST  Final   Special Requests   Final    BOTTLES DRAWN AEROBIC AND ANAEROBIC Blood Culture adequate volume   Culture NO GROWTH 5 DAYS  Final   Report Status 04/20/2017 FINAL  Final  Blood culture (routine x 2)     Status: None   Collection Time: 04/15/17  4:48 PM  Result Value Ref Range Status   Specimen Description BLOOD LEFT ANTECUBITAL  Final   Special Requests   Final    BOTTLES DRAWN AEROBIC AND ANAEROBIC Blood Culture adequate volume   Culture NO GROWTH 5 DAYS  Final   Report Status 04/20/2017 FINAL  Final  MRSA PCR Screening     Status: None   Collection Time: 04/15/17  7:59 PM  Result Value Ref Range Status   MRSA by PCR NEGATIVE NEGATIVE Final    Comment:        The GeneXpert MRSA Assay (FDA approved for NASAL specimens only), is one component of a comprehensive MRSA colonization surveillance program. It is not intended to diagnose MRSA infection nor to guide or monitor treatment for MRSA infections.      Labs: BNP (last 3 results) No results for input(s): BNP in the last 8760 hours. Basic Metabolic Panel:  Recent Labs Lab 04/15/17 1148 04/16/17 0617 04/17/17 0459  NA 133* 136 137  K 5.8* 5.4* 6.2*  CL 98* 104 105  CO2 22 19* 21*  GLUCOSE 101* 89 96  BUN 92* 83* 85*  CREATININE 4.18* 3.54* 3.22*  CALCIUM 8.9 8.2* 8.6*  MG  --   --  1.6*   Liver  Function Tests:  Recent Labs Lab 04/15/17 1148 04/16/17 0617  AST 51* 35  ALT 39 31  ALKPHOS 138* 118  BILITOT 0.5 0.7  PROT 7.0 6.4*  ALBUMIN 2.5* 2.3*    Recent Labs Lab 04/15/17 1148  LIPASE 26   No results for input(s): AMMONIA in the last 168 hours. CBC:  Recent Labs Lab 04/15/17 1148 04/16/17 0617 04/17/17 0459  WBC 13.0* 11.1* 12.1*  NEUTROABS 9.0*  --  8.4*  HGB 11.0* 10.2* 10.1*  HCT 35.9* 33.8* 34.1*  MCV 88.2 88.7 89.7  PLT 209 226 247   Cardiac Enzymes: No results for input(s): CKTOTAL, CKMB,  CKMBINDEX, TROPONINI in the last 168 hours. BNP: Invalid input(s): POCBNP CBG:  Recent Labs Lab 04/16/17 0752 04/16/17 1205 04/16/17 1728 04/16/17 2102 04/17/17 0752  GLUCAP 83 111* 86 95 90   D-Dimer No results for input(s): DDIMER in the last 72 hours. Hgb A1c No results for input(s): HGBA1C in the last 72 hours. Lipid Profile No results for input(s): CHOL, HDL, LDLCALC, TRIG, CHOLHDL, LDLDIRECT in the last 72 hours. Thyroid function studies No results for input(s): TSH, T4TOTAL, T3FREE, THYROIDAB in the last 72 hours.  Invalid input(s): FREET3 Anemia work up No results for input(s): VITAMINB12, FOLATE, FERRITIN, TIBC, IRON, RETICCTPCT in the last 72 hours. Urinalysis    Component Value Date/Time   COLORURINE AMBER (A) 04/15/2017 1152   APPEARANCEUR HAZY (A) 04/15/2017 1152   LABSPEC 1.015 04/15/2017 1152   PHURINE 5.0 04/15/2017 1152   GLUCOSEU NEGATIVE 04/15/2017 1152   HGBUR NEGATIVE 04/15/2017 1152   BILIRUBINUR NEGATIVE 04/15/2017 1152   KETONESUR NEGATIVE 04/15/2017 1152   PROTEINUR 100 (A) 04/15/2017 1152   UROBILINOGEN 0.2 04/23/2013 1619   NITRITE NEGATIVE 04/15/2017 1152   LEUKOCYTESUR LARGE (A) 04/15/2017 1152   Sepsis Labs Invalid input(s): PROCALCITONIN,  WBC,  LACTICIDVEN Microbiology Recent Results (from the past 240 hour(s))  Urine culture     Status: Abnormal   Collection Time: 04/15/17 11:52 AM  Result Value Ref  Range Status   Specimen Description URINE, RANDOM  Final   Special Requests NONE  Final   Culture >=100,000 COLONIES/mL KLEBSIELLA PNEUMONIAE (A)  Final   Report Status 04/17/2017 FINAL  Final   Organism ID, Bacteria KLEBSIELLA PNEUMONIAE (A)  Final      Susceptibility   Klebsiella pneumoniae - MIC*    AMPICILLIN >=32 RESISTANT Resistant     CEFAZOLIN <=4 SENSITIVE Sensitive     CEFTRIAXONE <=1 SENSITIVE Sensitive     CIPROFLOXACIN <=0.25 SENSITIVE Sensitive     GENTAMICIN <=1 SENSITIVE Sensitive     IMIPENEM <=0.25 SENSITIVE Sensitive     NITROFURANTOIN 64 INTERMEDIATE Intermediate     TRIMETH/SULFA <=20 SENSITIVE Sensitive     AMPICILLIN/SULBACTAM 8 SENSITIVE Sensitive     PIP/TAZO <=4 SENSITIVE Sensitive     Extended ESBL NEGATIVE Sensitive     * >=100,000 COLONIES/mL KLEBSIELLA PNEUMONIAE  Blood culture (routine x 2)     Status: None   Collection Time: 04/15/17  4:40 PM  Result Value Ref Range Status   Specimen Description BLOOD LEFT WRIST  Final   Special Requests   Final    BOTTLES DRAWN AEROBIC AND ANAEROBIC Blood Culture adequate volume   Culture NO GROWTH 5 DAYS  Final   Report Status 04/20/2017 FINAL  Final  Blood culture (routine x 2)     Status: None   Collection Time: 04/15/17  4:48 PM  Result Value Ref Range Status   Specimen Description BLOOD LEFT ANTECUBITAL  Final   Special Requests   Final    BOTTLES DRAWN AEROBIC AND ANAEROBIC Blood Culture adequate volume   Culture NO GROWTH 5 DAYS  Final   Report Status 04/20/2017 FINAL  Final  MRSA PCR Screening     Status: None   Collection Time: 04/15/17  7:59 PM  Result Value Ref Range Status   MRSA by PCR NEGATIVE NEGATIVE Final    Comment:        The GeneXpert MRSA Assay (FDA approved for NASAL specimens only), is one component of a comprehensive MRSA colonization surveillance program. It is not intended to diagnose  MRSA infection nor to guide or monitor treatment for MRSA infections.      Time  coordinating discharge: 45 minutes  SIGNED:   Tawni Millers, MD  [facility] 04/20/2017, 3:06 PM Pager [phone]  If 7PM-7AM, please contact night-coverage [email or web address] Password [email or web address]

## 2017-05-12 DEATH — deceased

## 2017-11-04 ENCOUNTER — Encounter: Payer: Self-pay | Admitting: Gastroenterology

## 2018-04-14 IMAGING — CT CT ABD-PELV W/ CM
2 of 5 series · 16 of 46 positions shown, 18 images · IV contrast (iopamidol)
Comparison: CT abdomen and pelvis on 04/23/2013

CLINICAL DATA: Fall.  Pain.  Confusion.

EXAM:
CT ABDOMEN AND PELVIS WITH CONTRAST
TECHNIQUE: Multidetector CT imaging of the abdomen and pelvis was performed
using the standard protocol following bolus administration of
intravenous contrast.
CONTRAST:  80 cc [id number] IOPAMIDOL ([id number]) INJECTION 61%

[Series 3: a/p w/ 5mm · axial · 0.98mm/px · z∈[-1002,-512]mm · 13 of 110 slices shown, 15 images]
[im 6/110  soft-tissue]
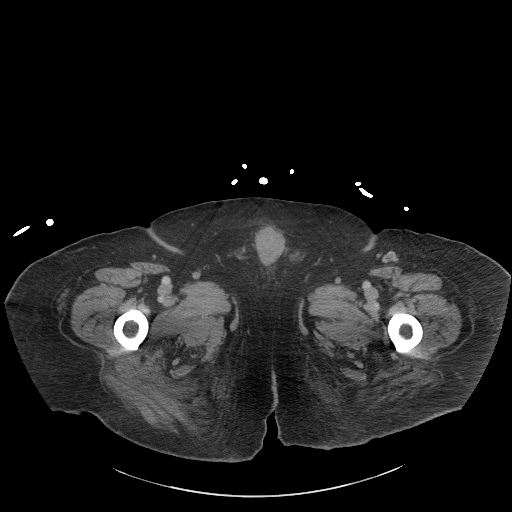
[im 6/110  bone]
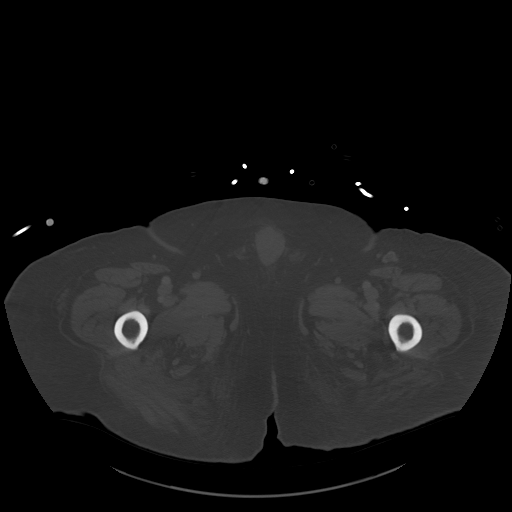
[im 17/110  soft-tissue]
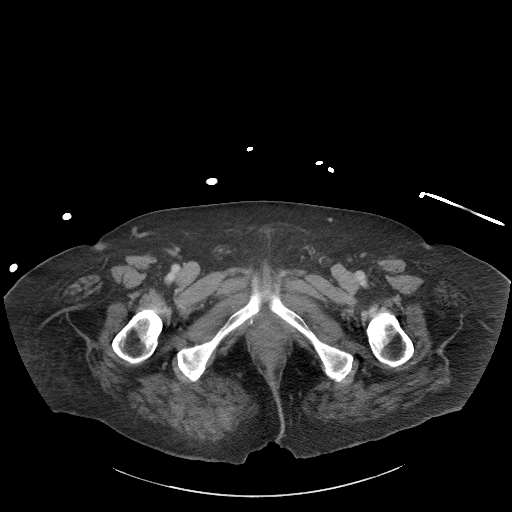
[im 22/110  soft-tissue]
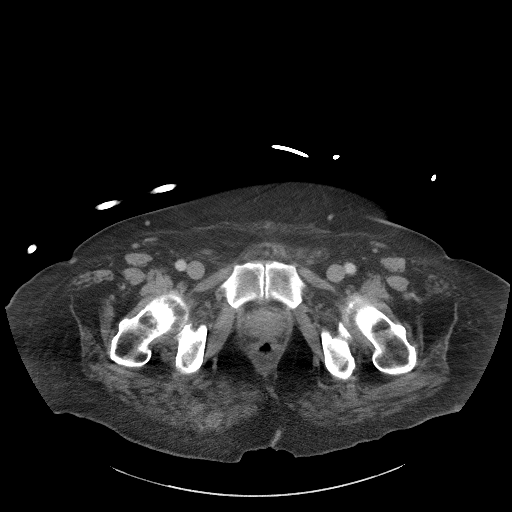
[im 33/110  soft-tissue]
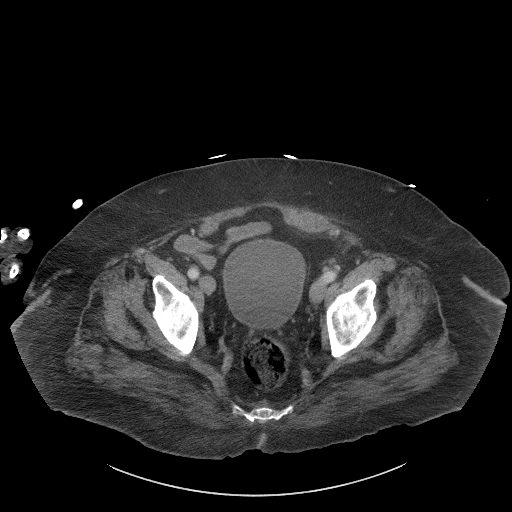
[im 39/110  soft-tissue]
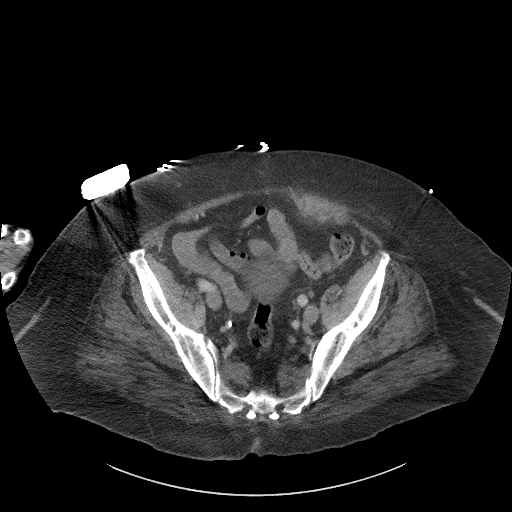
[im 50/110  soft-tissue]
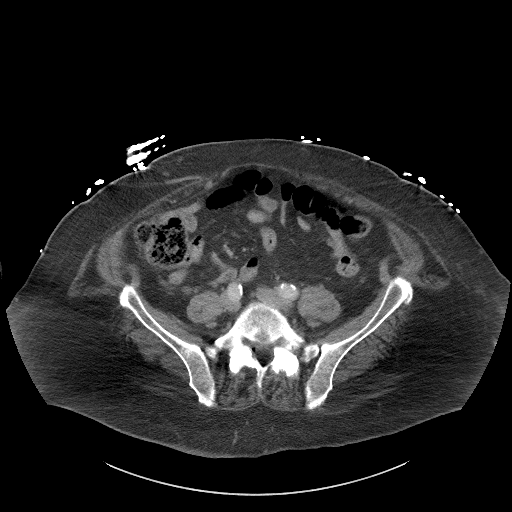
[im 55/110  soft-tissue]
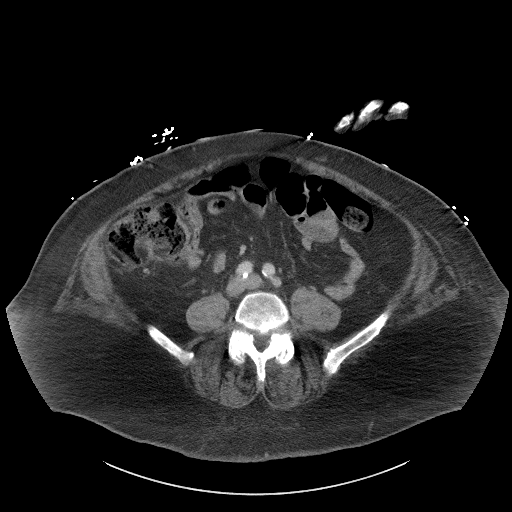
[im 60/110  soft-tissue]
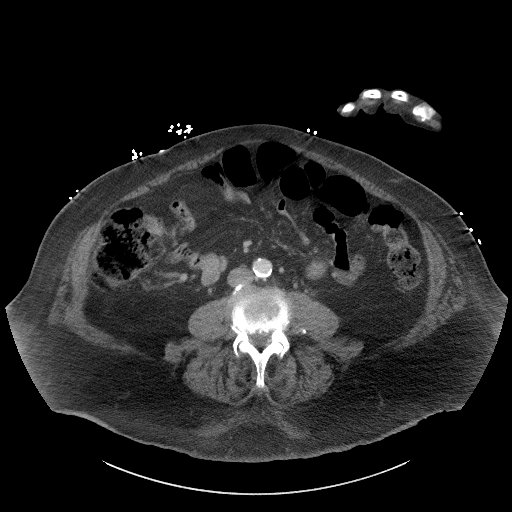
[im 71/110  soft-tissue]
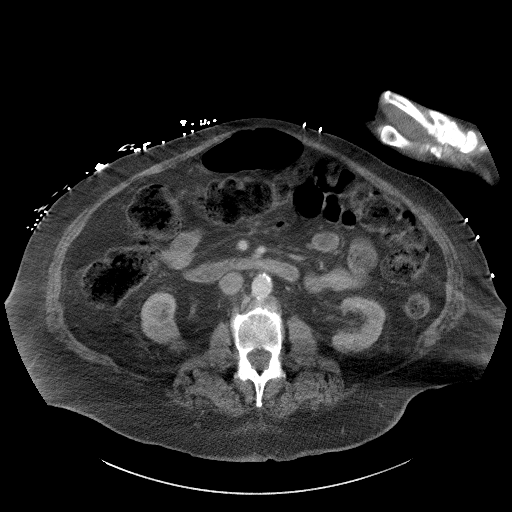
[im 71/110  bone]
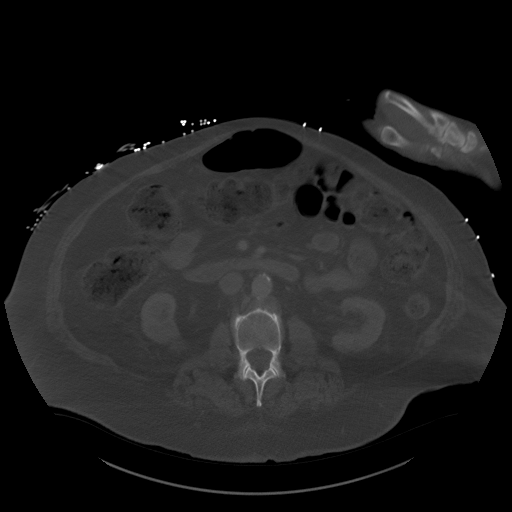
[im 77/110  soft-tissue]
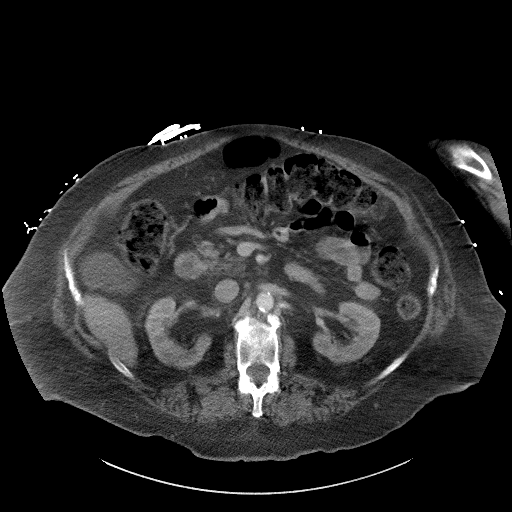
[im 88/110  soft-tissue]
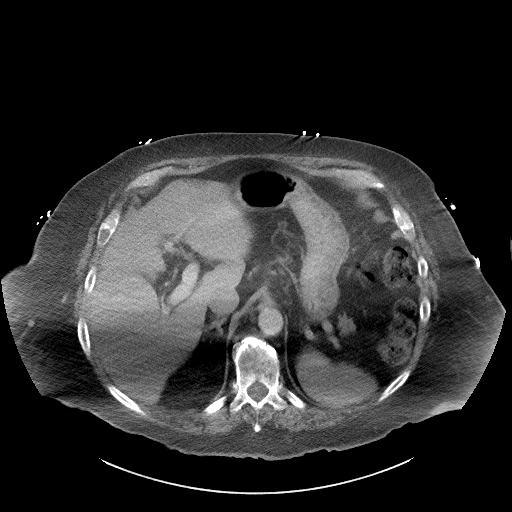
[im 93/110  soft-tissue]
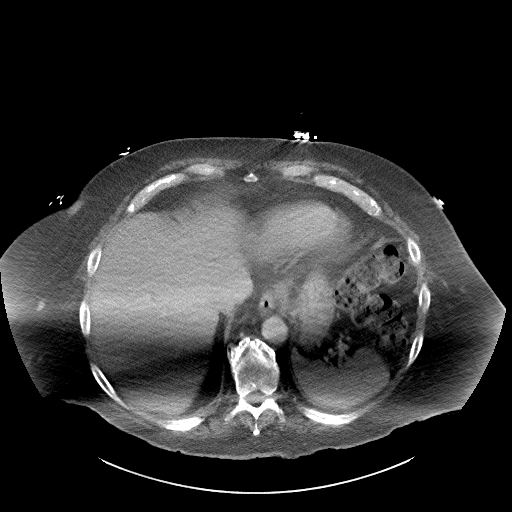
[im 104/110  soft-tissue]
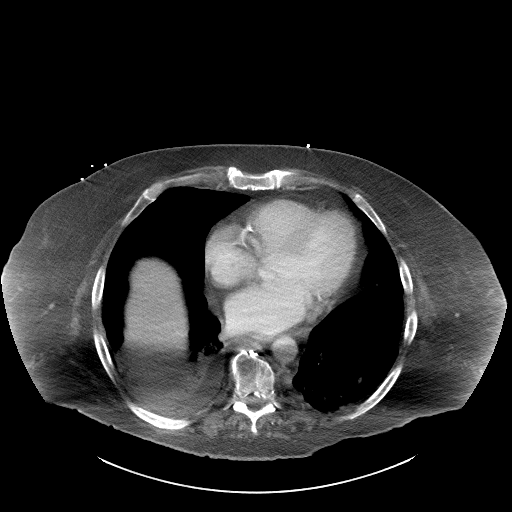

[Series 5: a/p w/ cor · coronal · 1.06mm/px · 3 of 180 slices shown]
[im 60/180  soft-tissue]
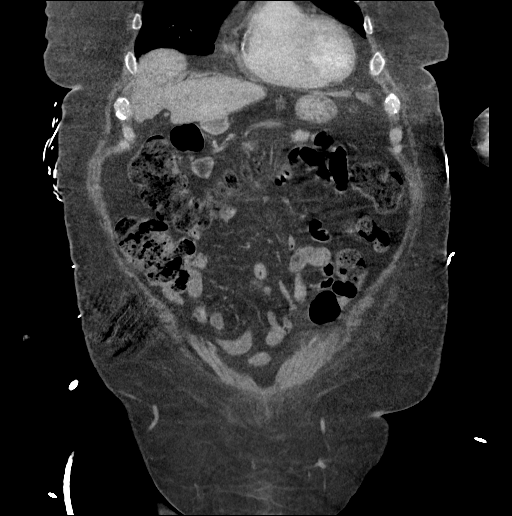
[im 80/180  soft-tissue]
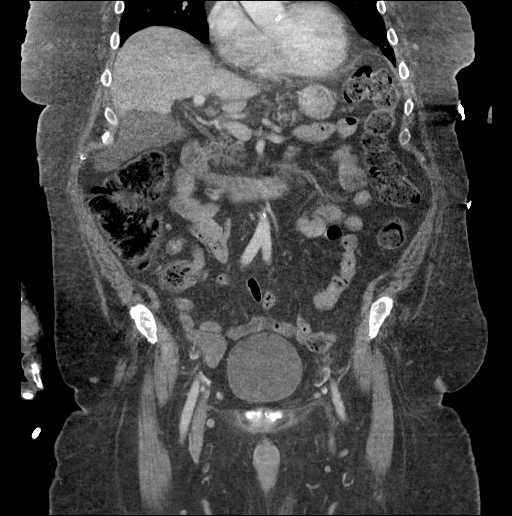
[im 100/180  soft-tissue]
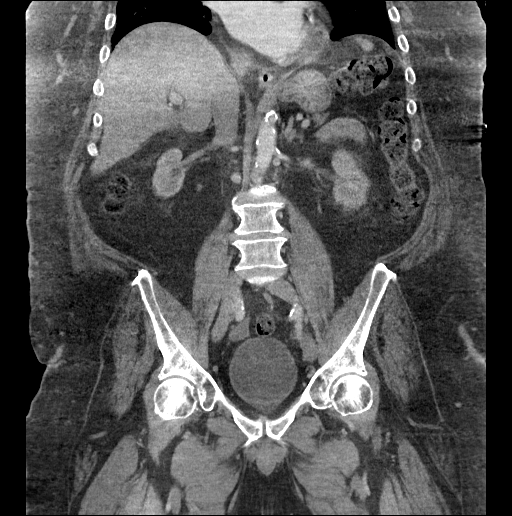

[16 of 46 positions shown; findings below may reference images not displayed]

FINDINGS: Lower chest: There are small bilateral pleural effusions, right
greater than left. Bibasilar atelectasis is present. No visible
pneumothorax. There is significant three-vessel coronary artery
disease.

Hepatobiliary: Liver is homogeneous without focal lesion.
Gallbladder is distended and otherwise normal in appearance.

Pancreas: Unremarkable. No pancreatic ductal dilatation or
surrounding inflammatory changes.

Spleen: Normal in size without focal abnormality.

Adrenals/Urinary Tract: Adrenal glands are normal in appearance.
There is a new 2 mm nonobstructing intrarenal calcification in the
midpole region of the right kidney. There is a 1 cm cyst within the
midpole region of the right kidney. No hydronephrosis or ureteral
obstruction. No suspicious renal mass. Urinary bladder is normal in
appearance.

Stomach/Bowel: Stomach and small bowel loops are normal in
appearance. There is a large amount of stool throughout mildly
distended loops of colon. Scattered diverticula are present. No
acute diverticulitis. The appendix is well seen and has a normal
appearance.

Vascular/Lymphatic: There is moderate atherosclerosis of the
abdominal aorta not associated with aneurysm. Nonspecific, small
femoral lymph nodes are present. No significant adenopathy. Although
atherosclerotic, there is normal vascular opacification of the
celiac axis, superior mesenteric artery, and inferior mesenteric
artery. Normal appearance of the portal venous system and inferior
vena cava.

Reproductive: Normal appearance of the seminal vesicles. Prostate
contains calcifications and is normal in size.

Other: Small fat containing paraumbilical hernia.

Musculoskeletal: Partially imaged is T7 fracture, evaluated on
recent CT 02/21/2017. No interval fractures are identified.
IMPRESSION: 1. Bilateral pleural effusions and bibasilar atelectasis.
2. Partially imaged T7 fracture, recently evaluated.
3. No evidence for acute injury of the abdomen or pelvis.
4. Coronary artery disease.
5.  Aortic atherosclerosis.
6. Large stool burden.

## 2018-04-14 IMAGING — CT CT HEAD W/O CM
5 of 6 series · 17 of 47 positions shown, 18 images · IV contrast (iopamidol)
Comparison: 02/21/2017

CLINICAL DATA: Ct head/cspine wo, fall, confusion, best images
possible due to pt movement ^100mL [id number] IOPAMIDOL
([id number]) INJECTION 61%; Ct head, fall, confusion Ct head, fall,
confusion Ct head/cspine wo, fall, confusion, best images possible
due to pt movement

EXAM:
CT HEAD WITHOUT CONTRAST
CT CERVICAL SPINE WITHOUT CONTRAST
TECHNIQUE: Multidetector CT imaging of the head and cervical spine was
performed following the standard protocol without intravenous
contrast. Multiplanar CT image reconstructions of the cervical spine
were also generated.

[Series 3: head without · axial · non-contrast · 0.43mm/px · z∈[-208,-154]mm · 2 of 35 slices shown, 3 images]
[im 12/35  brain]
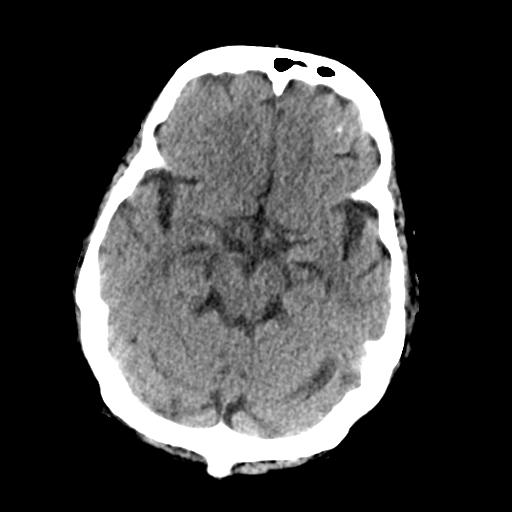
[im 12/35  bone]
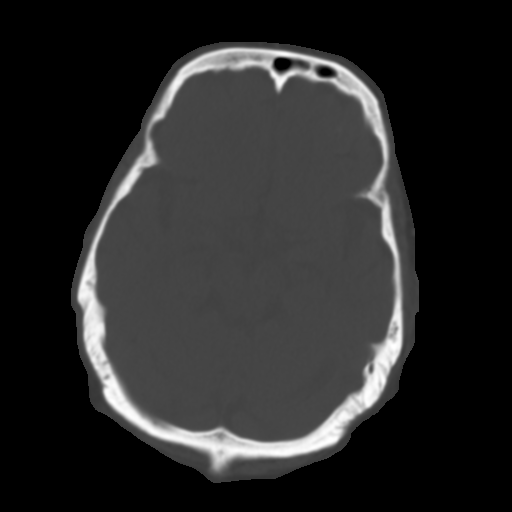
[im 23/35  brain]
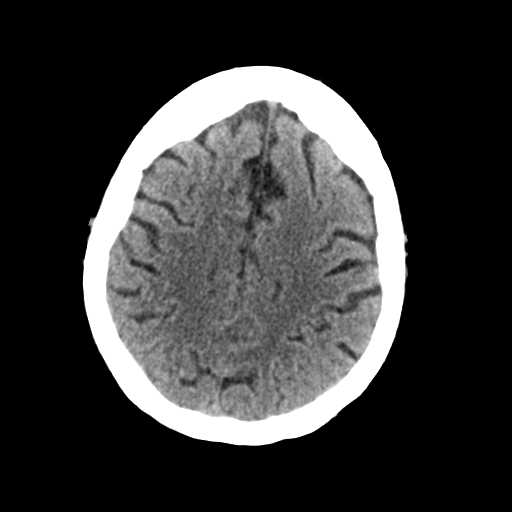

[Series 4: head bone · axial · 0.43mm/px · z∈[-250,-110]mm · 8 of 86 slices shown]
[im 8/86  bone]
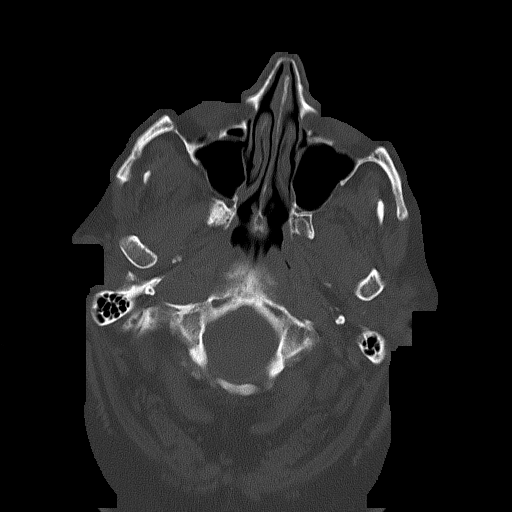
[im 16/86  bone]
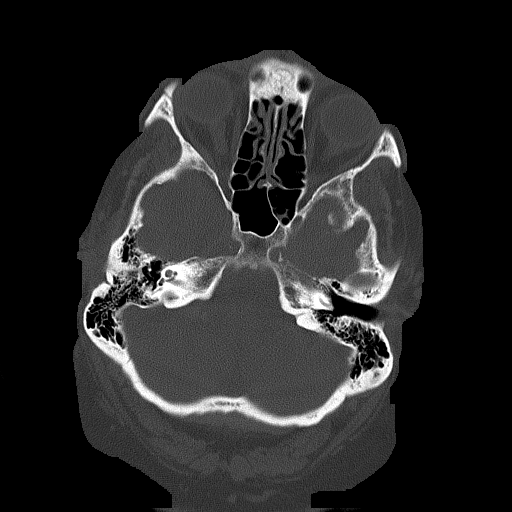
[im 31/86  bone]
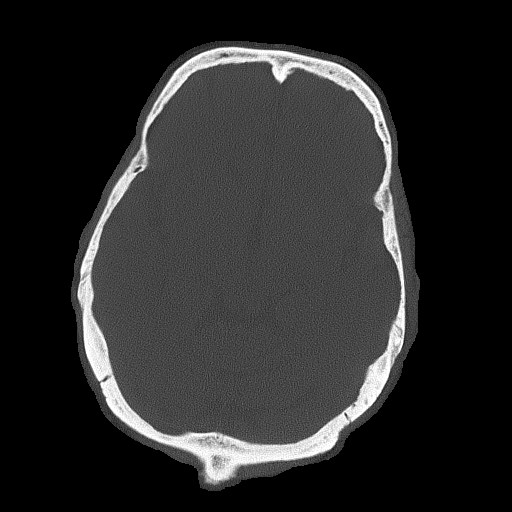
[im 39/86  bone]
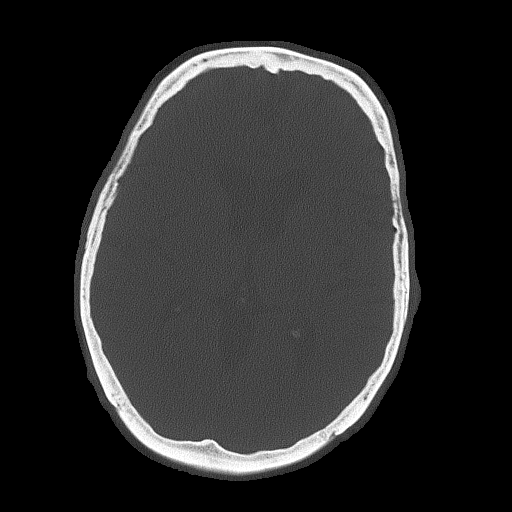
[im 47/86  bone]
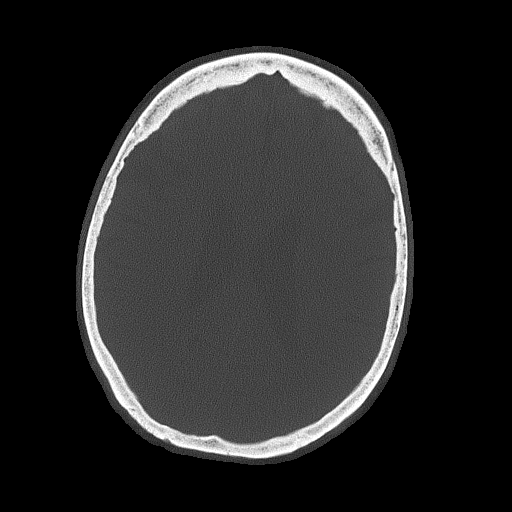
[im 55/86  bone]
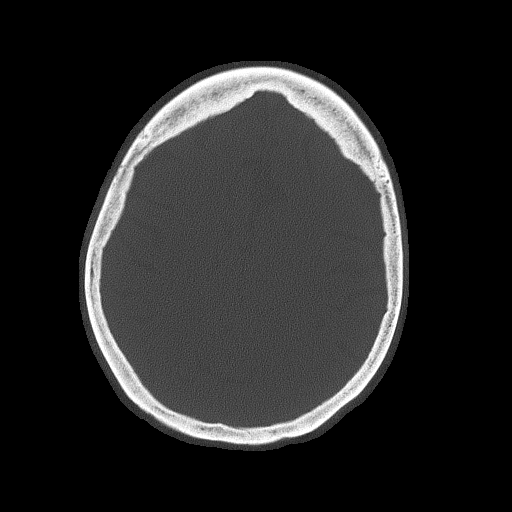
[im 70/86  bone]
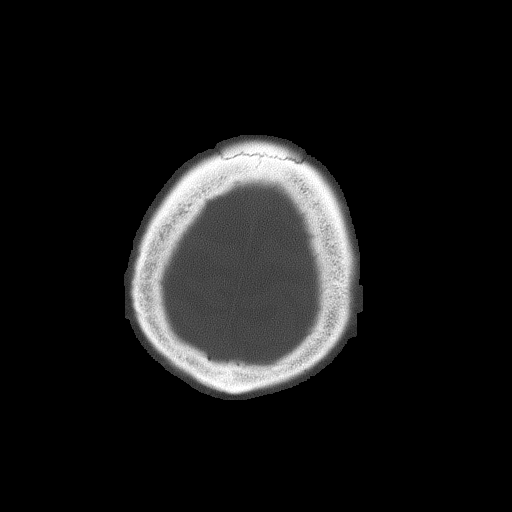
[im 78/86  bone]
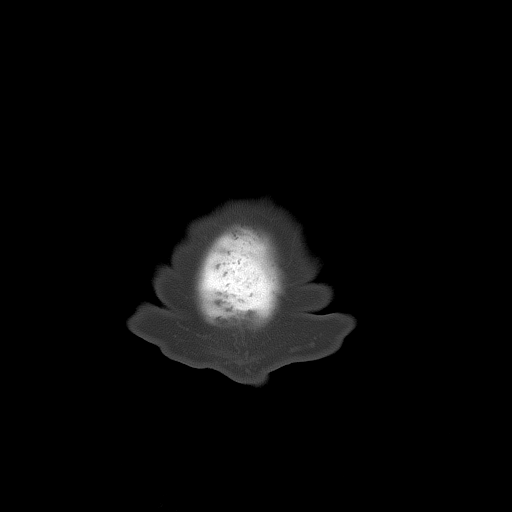

[Series 5: head without cor · coronal · non-contrast · 0.33mm/px · 3 of 74 slices shown]
[im 25/74  brain]
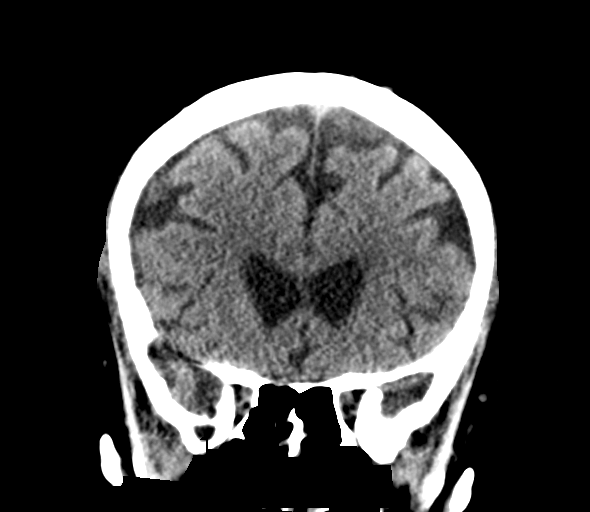
[im 33/74  brain]
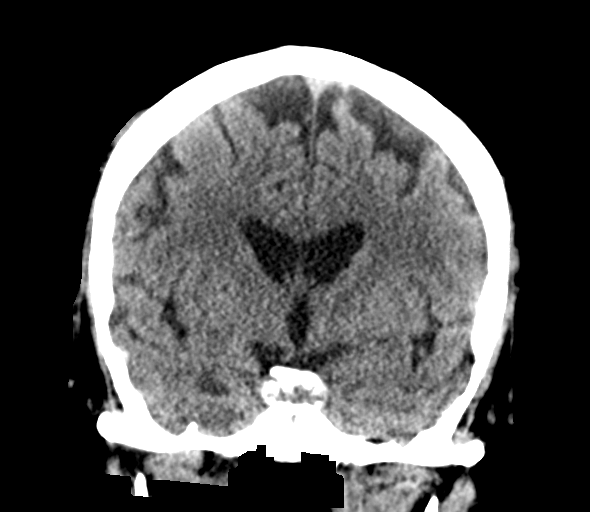
[im 41/74  brain]
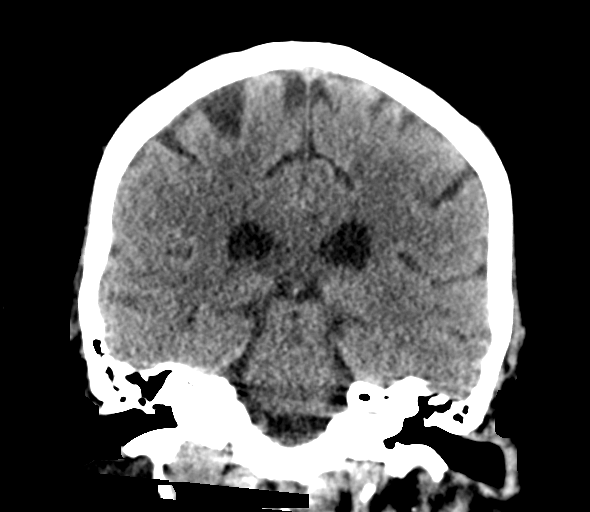

[Series 6: head without sag · sagittal · non-contrast · 0.33mm/px · 2 of 67 slices shown]
[im 23/67  brain]
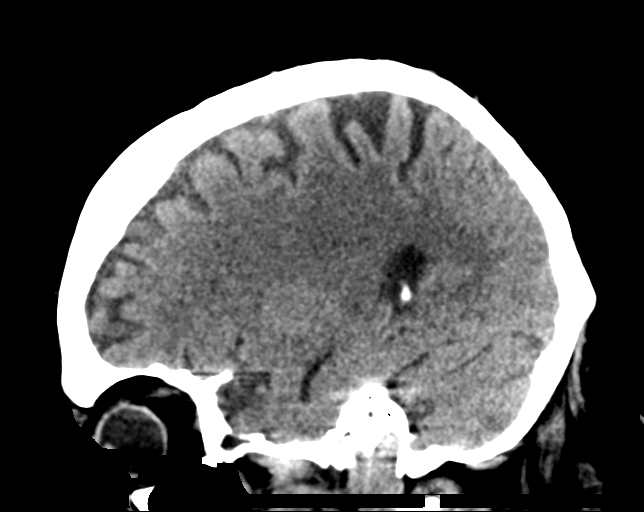
[im 45/67  brain]
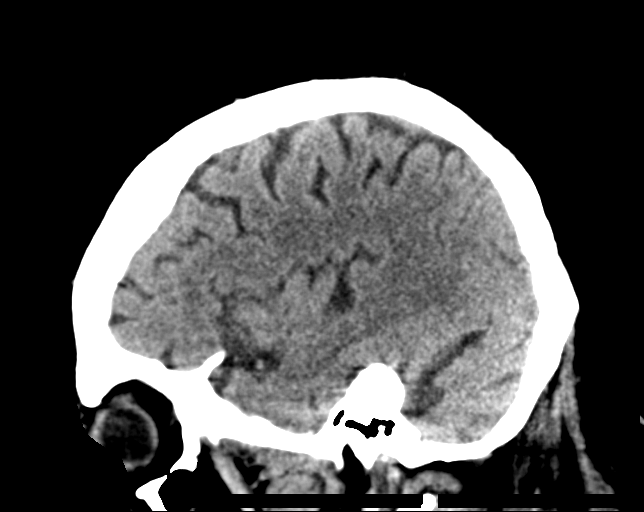

[Series 7: c_spine 2.0 st · axial · 0.43mm/px · z∈[-396,-366]mm · 2 of 99 slices shown]
[im 8/99  brain]
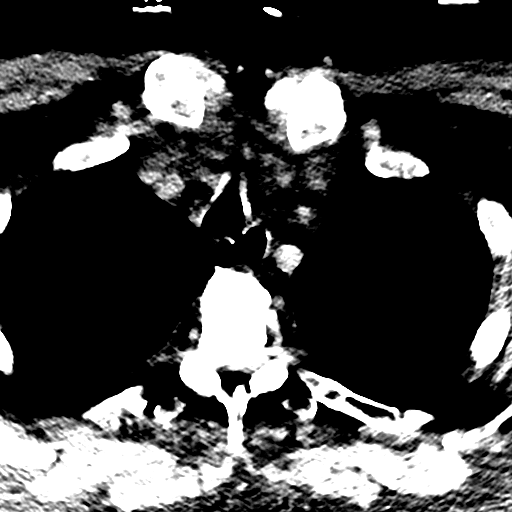
[im 23/99  brain]
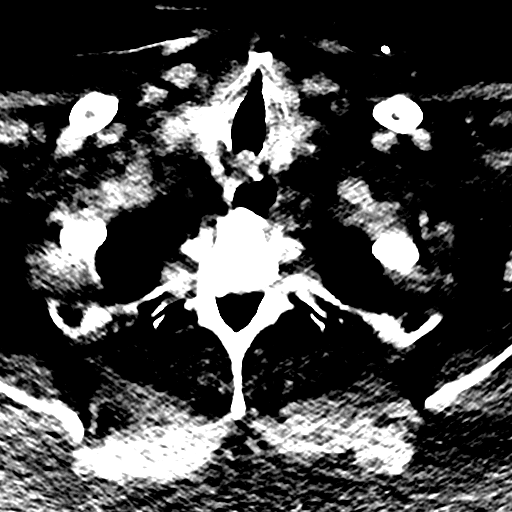

[17 of 47 positions shown; findings below may reference images not displayed]

FINDINGS: CT HEAD FINDINGS

Brain: There is central and cortical atrophy. Periventricular white
matter changes are consistent with small vessel disease. There is no
intra or extra-axial fluid collection or mass lesion. The basilar
cisterns and ventricles have a normal appearance. There is no CT
evidence for acute infarction or hemorrhage.

Vascular: There is atherosclerotic calcification of the carotid
siphons.

Skull: Normal. Negative for fracture or focal lesion.

Sinuses/Orbits: No acute finding.

Other: None

CT CERVICAL SPINE FINDINGS

Alignment: There is loss of cervical lordosis. This may be secondary
to splinting, soft tissue injury, or positioning.

Skull base and vertebrae: No acute fracture. No primary bone lesion
or focal pathologic process.

Soft tissues and spinal canal: No prevertebral fluid or swelling. No
visible canal hematoma.

Disc levels: Significant disc height loss and mid cervical levels,
predominantly at C4-5, C5-6, and C6-7.

Upper chest: Negative.

Other: Study quality is degraded by significant patient motion
artifact.
IMPRESSION: 1. Atrophy and small vessel disease.
2.  No evidence for acute intracranial abnormality.
3. Cervical spine exam is significantly degraded by patient motion
artifact. No obvious abnormality identified .

## 2018-04-14 IMAGING — DX DG CHEST 1V PORT
1 series · 1 of 1 positions shown · non-contrast
Comparison: 10/24/2016

CLINICAL DATA: Altered mental status

EXAM:
PORTABLE CHEST 1 VIEW

[chest ap]
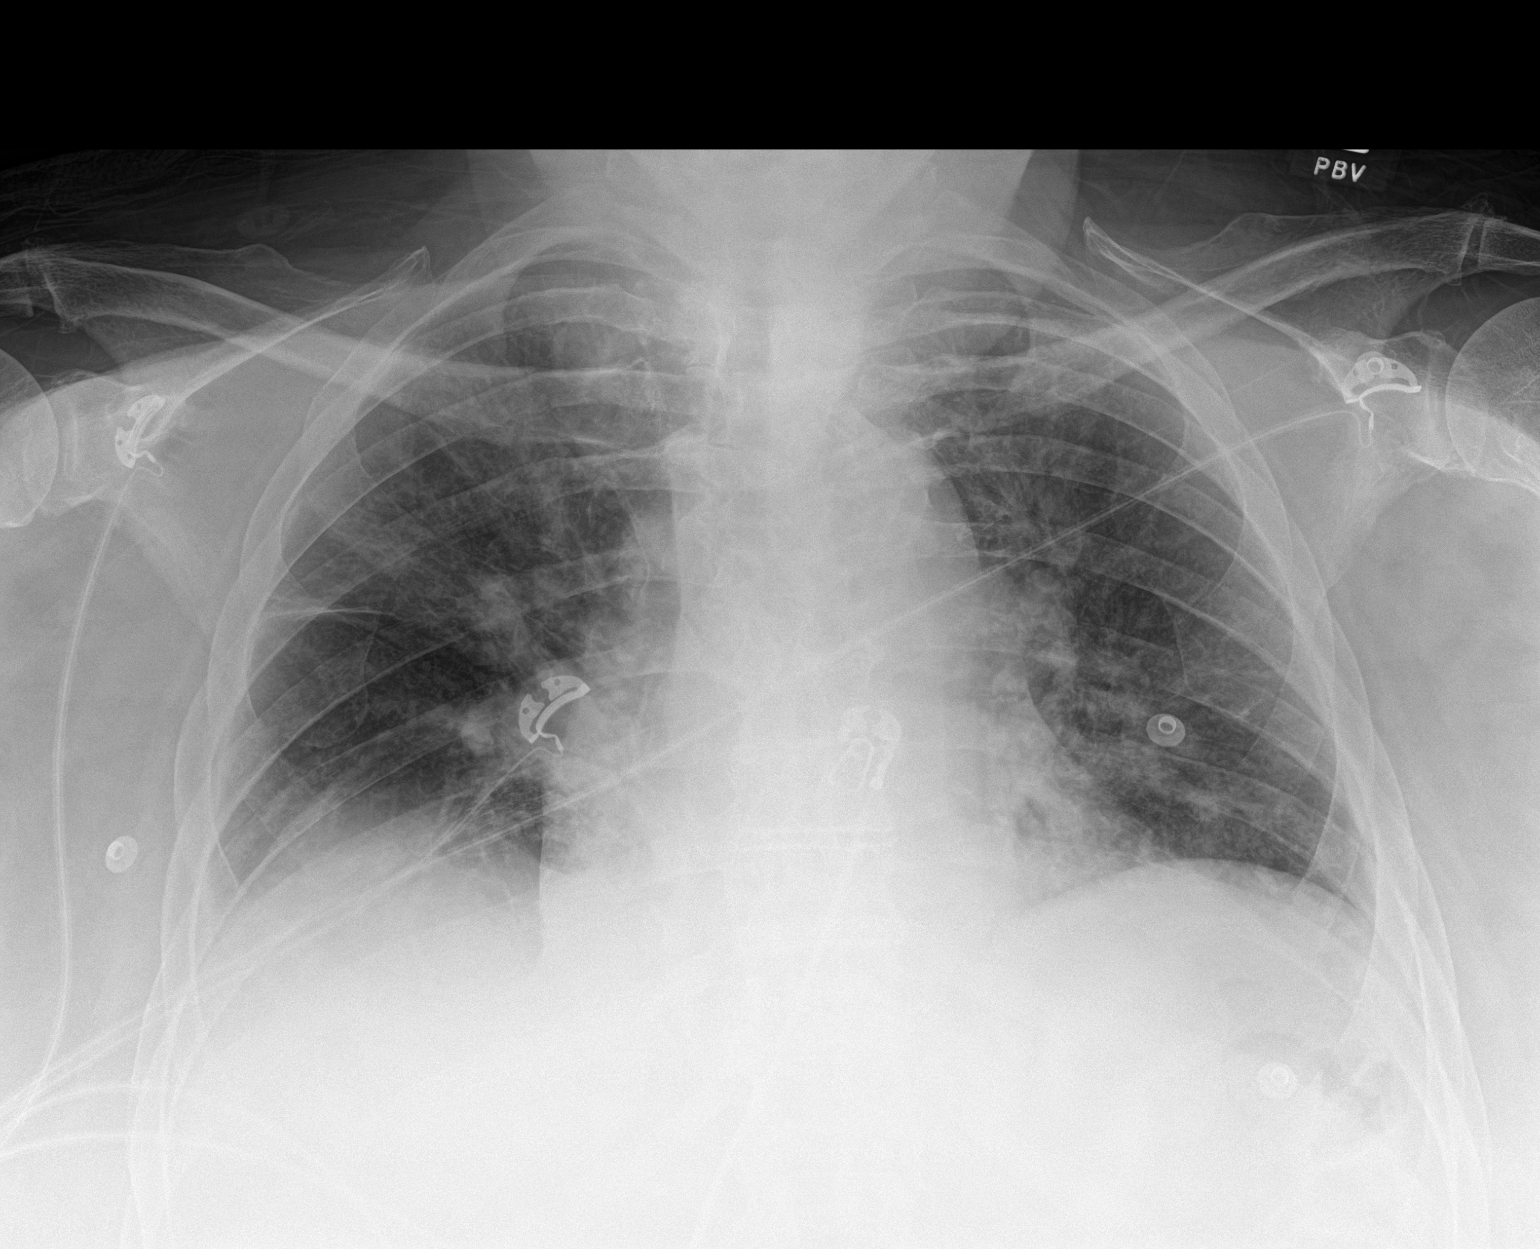

[1 of 1 positions shown; findings below may reference images not displayed]

FINDINGS: Low lung volumes with patchy atelectasis at the bases. Mild
cardiomegaly with mild central congestion. Probable tiny right
pleural effusion. Aortic atherosclerosis. Mildly nodular right
perihilar opacity could relate to vascular structure. No
pneumothorax.
IMPRESSION: 1. Low lung volumes with patchy atelectasis at the bases and
possible tiny right effusion
2. Stable cardiomegaly with mild central congestion
